# Patient Record
Sex: Male | Born: 1960
Health system: Southern US, Community
[De-identification: ages and names within clinical notes are randomized; demographics above are authoritative.]

## PROBLEM LIST (undated history)

## (undated) DIAGNOSIS — I10 Essential (primary) hypertension: Secondary | ICD-10-CM

---

## 2019-06-15 ENCOUNTER — Emergency Department (HOSPITAL_COMMUNITY)
Admission: EM | Admit: 2019-06-15 | Discharge: 2019-06-15 | Disposition: A | Discharge status: Home / Self Care | Payer: Self-pay | Attending: Emergency Medicine | Admitting: Emergency Medicine

## 2019-06-15 ENCOUNTER — Other Ambulatory Visit: Payer: Self-pay

## 2019-06-15 ENCOUNTER — Encounter (HOSPITAL_COMMUNITY): Payer: Self-pay

## 2019-06-15 DIAGNOSIS — R300 Dysuria: Secondary | ICD-10-CM | POA: Insufficient documentation

## 2019-06-15 DIAGNOSIS — F17200 Nicotine dependence, unspecified, uncomplicated: Secondary | ICD-10-CM | POA: Insufficient documentation

## 2019-06-15 DIAGNOSIS — R310 Gross hematuria: Secondary | ICD-10-CM | POA: Insufficient documentation

## 2019-06-15 DIAGNOSIS — I10 Essential (primary) hypertension: Secondary | ICD-10-CM | POA: Insufficient documentation

## 2019-06-15 HISTORY — DX: Essential (primary) hypertension: I10

## 2019-06-15 LAB — CBC WITH DIFFERENTIAL/PLATELET
Abs Immature Granulocytes: 0.02 10*3/uL (ref 0.00–0.07)
Basophils Absolute: 0 10*3/uL (ref 0.0–0.1)
Basophils Relative: 1 %
Eosinophils Absolute: 0.2 10*3/uL (ref 0.0–0.5)
Eosinophils Relative: 2 %
HCT: 45.5 % (ref 39.0–52.0)
Hemoglobin: 14.7 g/dL (ref 13.0–17.0)
Immature Granulocytes: 0 %
Lymphocytes Relative: 20 %
Lymphs Abs: 1.3 10*3/uL (ref 0.7–4.0)
MCH: 29.1 pg (ref 26.0–34.0)
MCHC: 32.3 g/dL (ref 30.0–36.0)
MCV: 90.1 fL (ref 80.0–100.0)
Monocytes Absolute: 0.8 10*3/uL (ref 0.1–1.0)
Monocytes Relative: 13 %
Neutro Abs: 4.1 10*3/uL (ref 1.7–7.7)
Neutrophils Relative %: 64 %
Platelets: 256 10*3/uL (ref 150–400)
RBC: 5.05 MIL/uL (ref 4.22–5.81)
RDW: 13 % (ref 11.5–15.5)
WBC: 6.4 10*3/uL (ref 4.0–10.5)
nRBC: 0 % (ref 0.0–0.2)

## 2019-06-15 LAB — COMPREHENSIVE METABOLIC PANEL
ALT: 31 U/L (ref 0–44)
AST: 37 U/L (ref 15–41)
Albumin: 3.8 g/dL (ref 3.5–5.0)
Alkaline Phosphatase: 56 U/L (ref 38–126)
Anion gap: 12 (ref 5–15)
BUN: 20 mg/dL (ref 6–20)
CO2: 21 mmol/L — ABNORMAL LOW (ref 22–32)
Calcium: 9 mg/dL (ref 8.9–10.3)
Chloride: 107 mmol/L (ref 98–111)
Creatinine, Ser: 1.19 mg/dL (ref 0.61–1.24)
GFR calc Af Amer: >60 mL/min (ref >60)
GFR calc non Af Amer: >60 mL/min (ref >60)
Glucose, Bld: 144 mg/dL — ABNORMAL HIGH (ref 70–99)
Potassium: 4.5 mmol/L (ref 3.5–5.1)
Sodium: 140 mmol/L (ref 135–145)
Total Bilirubin: 0.4 mg/dL (ref 0.3–1.2)
Total Protein: 7.4 g/dL (ref 6.5–8.1)

## 2019-06-15 LAB — URINALYSIS, ROUTINE W REFLEX MICROSCOPIC
Bacteria, UA: NONE SEEN
Bilirubin Urine: NEGATIVE
Glucose, UA: NEGATIVE mg/dL
Hgb urine dipstick: NEGATIVE
Ketones, ur: NEGATIVE mg/dL
Leukocytes,Ua: NEGATIVE
Nitrite: NEGATIVE
Protein, ur: 100 mg/dL — AB
Specific Gravity, Urine: 1.02 (ref 1.005–1.030)
pH: 5 (ref 5.0–8.0)

## 2019-06-15 NOTE — ED Notes (Signed)
Discharge paperwork reviewed with pt.  Pt verbalized understanding, no questions or concerns.  Pt ambulatory at discharge.

## 2019-06-15 NOTE — Discharge Instructions (Signed)
Lab results were overall reassuring.  There was some protein in the urine, but no noted blood.  Follow-up with urology for any further assessment or management.

## 2019-06-15 NOTE — ED Triage Notes (Signed)
He states that he saw a small quantity of hematuria with his foirst void of the day. He denies any pain, discomfort, fever, nor any other sign of current illness.

## 2019-06-15 NOTE — ED Notes (Signed)
Pt provided with [place] sandwich and orange juice, per his request.

## 2019-06-15 NOTE — ED Provider Notes (Signed)
[facility] DEPT Provider Note   CSN: [id number] Arrival date & time: 06/15/19  0809    History   Chief Complaint Chief Complaint  Patient presents with  . Hematuria    HPI Chad Coleman is a 58 y.o. male.     HPI   Chad Coleman is a 58 y.o. male, with a history of HTN, presenting to the ED with hematuria beginning this morning.  He notes he had 3 separate instances of urine with streaks of blood.  Notes some burning dysuria when blood was present during urination, however, no discomfort was noted after the the bleeding resolved.Marland Kitchen  He is also had a small amount of bleeding into his underwear.  He has not experienced these symptoms before. He states he has not been sexually active for several months.  Denies fever/chills, N/V/D, pain with bowel movements, testicular/scrotal pain/swelling, difficulty urinating, decreased urination, abdominal pain, other bleeding, or any other complaints.    Past Medical History:  Diagnosis Date  . Hypertension     There are no active problems to display for this patient.   History reviewed. No pertinent surgical history.      Home Medications    Prior to Admission medications   Medication Sig Start Date End Date Taking? Authorizing Provider  acetaminophen (TYLENOL) 325 MG tablet Take 325 mg by mouth every 6 (six) hours as needed for headache.   Yes [provider]  hydrochlorothiazide (HYDRODIURIL) 25 MG tablet Take 25 mg by mouth daily.   Yes [provider]  Multiple Vitamins-Minerals (MULTIVITAL PO) Take 1 tablet by mouth daily.   Yes [provider]    Family History History reviewed. No pertinent family history.  Social History Social History   Tobacco Use  . Smoking status: Current Every Day Smoker  . Smokeless tobacco: Never Used  Substance Use Topics  . Alcohol use: Not on file  . Drug use: Not on file     Allergies   Patient has no known  allergies.   Review of Systems Review of Systems  Constitutional: Negative for chills, diaphoresis and fever.  Respiratory: Negative for shortness of breath.   Cardiovascular: Negative for chest pain.  Gastrointestinal: Negative for abdominal pain, blood in stool, diarrhea, nausea and vomiting.  Genitourinary: Positive for dysuria and hematuria. Negative for decreased urine volume, difficulty urinating, discharge, flank pain, scrotal swelling and testicular pain.  Musculoskeletal: Negative for back pain.  All other systems reviewed and are negative.    Physical Exam Updated Vital Signs BP (!) 156/108   Pulse 68   Temp 98.7 F (37.1 C) (Oral)   Resp 16   SpO2 98%   Physical Exam Vitals signs and nursing note reviewed.  Constitutional:      General: He is not in acute distress.    Appearance: He is well-developed. He is not diaphoretic.  HENT:     Head: Normocephalic and atraumatic.     Mouth/Throat:     Mouth: Mucous membranes are moist.     Pharynx: Oropharynx is clear.  Eyes:     Conjunctiva/sclera: Conjunctivae normal.  Neck:     Musculoskeletal: Neck supple.  Cardiovascular:     Rate and Rhythm: Normal rate and regular rhythm.     Pulses: Normal pulses.          Radial pulses are 2+ on the right side and 2+ on the left side.       Posterior tibial pulses are 2+ on the right  side and 2+ on the left side.     Heart sounds: Normal heart sounds.     Comments: Tactile temperature in the extremities appropriate and equal bilaterally. Pulmonary:     Effort: Pulmonary effort is normal. No respiratory distress.     Breath sounds: Normal breath sounds.  Abdominal:     Palpations: Abdomen is soft.     Tenderness: There is no abdominal tenderness. There is no guarding.  Genitourinary:    Comments: Patient has a spot of dried blood in the front of his underwear about the size of a half dollar. Genital Exam: Penis, scrotum, and testicles without swelling, lesions, or  tenderness. No penile discharge.  No noted active hemorrhage. No inguinal hernia noted. Cremasteric reflex intact. No inguinal lymphadenopathy.  Otherwise normal male genitalia.  Musculoskeletal:     Right lower leg: No edema.     Left lower leg: No edema.  Lymphadenopathy:     Cervical: No cervical adenopathy.  Skin:    General: Skin is warm and dry.  Neurological:     Mental Status: He is alert.  Psychiatric:        Mood and Affect: Mood and affect normal.        Speech: Speech normal.        Behavior: Behavior normal.      ED Treatments / Results  Labs (all labs ordered are listed, but only abnormal results are displayed) Labs Reviewed  URINALYSIS, ROUTINE W REFLEX MICROSCOPIC - Abnormal; Notable for the following components:      Result Value   Protein, ur 100 (*)    All other components within normal limits  COMPREHENSIVE METABOLIC PANEL - Abnormal; Notable for the following components:   CO2 21 (*)    Glucose, Bld 144 (*)    All other components within normal limits  URINE CULTURE  CBC WITH DIFFERENTIAL/PLATELET  RPR  HIV ANTIBODY (ROUTINE TESTING W REFLEX)  GC/CHLAMYDIA PROBE AMP (Startup) NOT AT [facility]    EKG None  Radiology No results found.  Procedures Procedures (including critical care time)  Medications Ordered in ED Medications - No data to display   Initial Impression / Assessment and Plan / ED Course  I have reviewed the triage vital signs and the nursing notes.  Pertinent labs & imaging results that were available during my care of the patient were reviewed by me and considered in my medical decision making (see chart for details).        Patient presents with complaint of gross hematuria.  It seems to have resolved. No hemoglobin on UA.  There was some proteinuria, which patient will have rechecked in the next week or two.  Creatinine, BUN, hemoglobin, and platelets were all reassuring.  Urology follow-up for continued symptoms.  Return precautions discussed.  Patient voices understanding of these instructions, accepts the plan, and is comfortable with discharge.  Patient's hypertension here is noted.  He had not yet taken his medicines today.  He does not seem to be symptomatic at this time.  Final Clinical Impressions(s) / ED Diagnoses   Final diagnoses:  Gross hematuria    ED Discharge Orders    None       Layla Maw 06/15/19 1420    Milton Ferguson, MD 06/18/19 1820

## 2019-06-16 LAB — URINE CULTURE: Culture: NO GROWTH

## 2019-06-16 LAB — HIV ANTIBODY (ROUTINE TESTING W REFLEX): HIV Screen 4th Generation wRfx: NONREACTIVE

## 2019-06-17 LAB — RPR, QUANT+TP ABS (REFLEX)
Rapid Plasma Reagin, Quant: 1:2 {titer} — ABNORMAL HIGH
T Pallidum Abs: REACTIVE — AB

## 2019-06-17 LAB — RPR: RPR Ser Ql: REACTIVE — AB

## 2019-06-18 LAB — GC/CHLAMYDIA PROBE AMP (~~LOC~~) NOT AT ARMC
Chlamydia: NEGATIVE
Neisseria Gonorrhea: NEGATIVE

## 2020-09-30 ENCOUNTER — Emergency Department (HOSPITAL_COMMUNITY): Payer: Self-pay

## 2020-09-30 ENCOUNTER — Emergency Department (HOSPITAL_COMMUNITY)
Admission: EM | Admit: 2020-09-30 | Discharge: 2020-09-30 | Disposition: A | Discharge status: Home / Self Care | Payer: Self-pay | Attending: Emergency Medicine | Admitting: Emergency Medicine

## 2020-09-30 ENCOUNTER — Encounter (HOSPITAL_COMMUNITY): Payer: Self-pay | Admitting: Emergency Medicine

## 2020-09-30 ENCOUNTER — Other Ambulatory Visit: Payer: Self-pay

## 2020-09-30 DIAGNOSIS — I10 Essential (primary) hypertension: Secondary | ICD-10-CM | POA: Insufficient documentation

## 2020-09-30 DIAGNOSIS — R030 Elevated blood-pressure reading, without diagnosis of hypertension: Secondary | ICD-10-CM

## 2020-09-30 DIAGNOSIS — F17200 Nicotine dependence, unspecified, uncomplicated: Secondary | ICD-10-CM | POA: Insufficient documentation

## 2020-09-30 DIAGNOSIS — R310 Gross hematuria: Secondary | ICD-10-CM | POA: Insufficient documentation

## 2020-09-30 LAB — URINALYSIS, ROUTINE W REFLEX MICROSCOPIC
Bilirubin Urine: NEGATIVE
Glucose, UA: NEGATIVE mg/dL
Ketones, ur: NEGATIVE mg/dL
Leukocytes,Ua: NEGATIVE
Nitrite: NEGATIVE
Protein, ur: 100 mg/dL — AB
RBC / HPF: >50 RBC/hpf — ABNORMAL HIGH (ref 0–5)
Specific Gravity, Urine: 1.017 (ref 1.005–1.030)
pH: 5 (ref 5.0–8.0)

## 2020-09-30 LAB — CBC
HCT: 46.9 % (ref 39.0–52.0)
Hemoglobin: 15.6 g/dL (ref 13.0–17.0)
MCH: 29.5 pg (ref 26.0–34.0)
MCHC: 33.3 g/dL (ref 30.0–36.0)
MCV: 88.7 fL (ref 80.0–100.0)
Platelets: 259 10*3/uL (ref 150–400)
RBC: 5.29 MIL/uL (ref 4.22–5.81)
RDW: 12.6 % (ref 11.5–15.5)
WBC: 5.4 10*3/uL (ref 4.0–10.5)
nRBC: 0 % (ref 0.0–0.2)

## 2020-09-30 LAB — BASIC METABOLIC PANEL
Anion gap: 11 (ref 5–15)
BUN: 17 mg/dL (ref 6–20)
CO2: 22 mmol/L (ref 22–32)
Calcium: 9 mg/dL (ref 8.9–10.3)
Chloride: 105 mmol/L (ref 98–111)
Creatinine, Ser: 1.26 mg/dL — ABNORMAL HIGH (ref 0.61–1.24)
GFR, Estimated: >60 mL/min (ref >60)
Glucose, Bld: 108 mg/dL — ABNORMAL HIGH (ref 70–99)
Potassium: 3.9 mmol/L (ref 3.5–5.1)
Sodium: 138 mmol/L (ref 135–145)

## 2020-09-30 MED ORDER — HYDROCHLOROTHIAZIDE 12.5 MG PO CAPS
12.5000 mg | ORAL_CAPSULE | Freq: Once | ORAL | Status: AC
  Administered 2020-09-30: 12.5 mg via ORAL
  Filled 2020-09-30: qty 1

## 2020-09-30 MED ORDER — HYDROCHLOROTHIAZIDE 25 MG PO TABS: 25.0000 mg | ORAL_TABLET | Freq: Every day | ORAL | 1 refills | Status: DC

## 2020-09-30 NOTE — ED Notes (Signed)
Requested urine from patient. 

## 2020-09-30 NOTE — ED Triage Notes (Signed)
Patient states that he seen blood in his urine twice at work. Patient is not feeling any pain. Patient states he has this before.

## 2020-09-30 NOTE — ED Provider Notes (Addendum)
[facility] DEPT Provider Note   CSN: [id number] Arrival date & time: 09/30/20  0350     History Chief Complaint  Patient presents with  . Hematuria    Chad Coleman is a 59 y.o. male with past medical history significant for tobacco use who presents for evaluation of hematuria.  Patient states he has had 3 episodes which began over the last 6 hours of hematuria.  States this only occurs when he urinates.  He has not noted any bleeding from his urethra.  He is not currently sexually active.  Denies any penile discharge or concerns for STDs.  He denies any current abdominal pain.  He was seen 1 year ago for similar complaints.  He did not follow-up with urology.  States at that time his symptoms had self resolved.  Denies fever, chills, nausea, vomiting, dysuria, flank pain, abdominal pain, rashes, lesions.  Denies additional aggravating or alleviating factors.  Elevated BP here. Has been out of his HCTZ for 2 months. States 'ran out of refills' and didn't go back to PCP to have this filled.  History obtained from patient and past medical records. No interpretor was used.  HPI     Past Medical History:  Diagnosis Date  . Hypertension     There are no problems to display for this patient.   History reviewed. No pertinent surgical history.     History reviewed. No pertinent family history.  Social History   Tobacco Use  . Smoking status: Current Every Day Smoker  . Smokeless tobacco: Never Used  Substance Use Topics  . Alcohol use: Not on file  . Drug use: Not on file    Home Medications Prior to Admission medications   Medication Sig Start Date End Date Taking? Authorizing Provider  acetaminophen (TYLENOL) 325 MG tablet Take 325 mg by mouth every 6 (six) hours as needed for headache.   Yes [provider]  hydrochlorothiazide (HYDRODIURIL) 25 MG tablet Take 1 tablet (25 mg total) by mouth daily. 09/30/20   Etai Copado A,  PA-C    Allergies    Patient has no known allergies.  Review of Systems   Review of Systems  Constitutional: Negative.   HENT: Negative.   Respiratory: Negative.   Cardiovascular: Negative.   Gastrointestinal: Negative.   Genitourinary: Positive for hematuria. Negative for decreased urine volume, difficulty urinating, discharge, dysuria, flank pain, frequency, penile pain, penile swelling, scrotal swelling, testicular pain and urgency.  Musculoskeletal: Negative.   Skin: Negative.   Neurological: Negative.   All other systems reviewed and are negative.   Physical Exam Updated Vital Signs BP (!) 164/114 (BP Location: Right Arm)   Pulse (!) 58   Temp 98.9 F (37.2 C) (Oral)   Resp 18   Ht 5\' 9"  (1.753 m)   Wt 78.5 kg   SpO2 97%   BMI 25.55 kg/m   Physical Exam Vitals and nursing note reviewed.  Constitutional:      General: He is not in acute distress.    Appearance: He is well-developed. He is not ill-appearing, toxic-appearing or diaphoretic.  HENT:     Head: Normocephalic and atraumatic.     Nose: Nose normal.     Mouth/Throat:     Mouth: Mucous membranes are moist.  Eyes:     Pupils: Pupils are equal, round, and reactive to light.  Cardiovascular:     Rate and Rhythm: Normal rate and regular rhythm.     Pulses: Normal pulses.  Radial pulses are 2+ on the right side and 2+ on the left side.       Dorsalis pedis pulses are 2+ on the right side and 2+ on the left side.       Posterior tibial pulses are 2+ on the right side and 2+ on the left side.     Heart sounds: Normal heart sounds.  Pulmonary:     Effort: Pulmonary effort is normal. No respiratory distress.     Breath sounds: Normal breath sounds and air entry.  Abdominal:     General: Bowel sounds are normal. There is no distension.     Palpations: Abdomen is soft. There is no mass.     Tenderness: There is no abdominal tenderness. There is no right CVA tenderness, left CVA tenderness, guarding or  rebound.     Hernia: No hernia is present.     Comments: No midline pulsatile abd mass. Neg CVA tap bilaterally  Genitourinary:    Comments: Declined GU exam Musculoskeletal:        General: No swelling, tenderness, deformity or signs of injury. Normal range of motion.     Cervical back: Normal range of motion and neck supple.     Right lower leg: No edema.     Left lower leg: No edema.  Skin:    General: Skin is warm and dry.     Capillary Refill: Capillary refill takes less than 2 seconds.     Comments: Tactile temp to extremities  Neurological:     General: No focal deficit present.     Mental Status: He is alert and oriented to person, place, and time.    ED Results / Procedures / Treatments   Labs (all labs ordered are listed, but only abnormal results are displayed) Labs Reviewed  URINALYSIS, ROUTINE W REFLEX MICROSCOPIC - Abnormal; Notable for the following components:      Result Value   Hgb urine dipstick LARGE (*)    Protein, ur 100 (*)    RBC / HPF >50 (*)    Bacteria, UA RARE (*)    All other components within normal limits  BASIC METABOLIC PANEL - Abnormal; Notable for the following components:   Glucose, Bld 108 (*)    Creatinine, Ser 1.26 (*)    All other components within normal limits  CBC    EKG None  Radiology CT Renal Stone Study  Result Date: 09/30/2020 CLINICAL DATA:  Flank pain with gross hematuria EXAM: CT ABDOMEN AND PELVIS WITHOUT CONTRAST TECHNIQUE: Multidetector CT imaging of the abdomen and pelvis was performed following the standard protocol without IV contrast. COMPARISON:  None. FINDINGS: Lower chest:  No contributory findings. Hepatobiliary: No focal liver abnormality.No evidence of biliary obstruction or stone. Pancreas: Unremarkable. Spleen: Unremarkable. Adrenals/Urinary Tract: Negative adrenals. No hydronephrosis or stone. Unremarkable bladder. Stomach/Bowel:  No obstruction. No appendicitis. Vascular/Lymphatic: No acute vascular  abnormality. No mass or adenopathy. Reproductive:Symmetric enlargement of the prostate, 5 cm in transverse span. Other: No ascites or pneumoperitoneum. Fatty bilateral inguinal hernia Musculoskeletal: No acute abnormalities. L1-2 and L3-4 predominant disc degeneration and facet osteoarthritis. IMPRESSION: 1. No hydronephrosis or urolithiasis. 2. Enlarged prostate. Electronically Signed   By: Monte Fantasia M.D.   On: 09/30/2020 05:27    Procedures Procedures (including critical care time)  Medications Ordered in ED Medications  hydrochlorothiazide (MICROZIDE) capsule 12.5 mg (has no administration in time range)    ED Course  I have reviewed the triage vital signs and the nursing  notes.  Pertinent labs & imaging results that were available during my care of the patient were reviewed by me and considered in my medical decision making (see chart for details).  59 year old male presents for evaluation of hematuria.  Had similar episode 1 year ago which self resolved.  Patient denies any dysuria, frequency, penile discharge.  Has not been recently sexually active and does not have any concerns for STDs.  No associated abdominal pain.  He is not on any anticoagulation.  Heart and lungs clear.  Abdomen soft, nontender.  He is neurovascularly intact.,  Tactile temperature to extremities.  Patient is hypertensive here in the emergency department.  He is not follow with PCP does not know if he has history of hypertension.  He denies any headache, lightness, dizziness, chest pain, shortness of breath, abdominal pain, nausea or vomiting.  I have low suspicion for hypertensive urgency or emergency at this time.  Plan on labs, imaging and reassess:  Labs and imaging personally reviewed and interpreted:  CBC without leukocytosis Metabolic panel with glucose 108, creatinine 1.26, similar to prior labs, noticed electrolyte, renal abnormality UA hematuria, no evidence of infection CT Stone without stone, does  have enlarged prostate  Patient reassessed.  Continues to be without pain.  Was given his home blood pressure medication.  Continue to have low suspicion for hypertensive urgency or emergency.  Unclear etiology of patient's hematuria.  I did discuss with him risks of elevated blood pressure.  We will have him follow-up with urology for his enlarged prostate as well as his recurrent hematuria.  No abdominal pain to suggest AAA, dissection, infectious process.  The patient has been appropriately medically screened and/or stabilized in the ED. I have low suspicion for any other emergent medical condition which would require further screening, evaluation or treatment in the ED or require inpatient management.  Patient is hemodynamically stable and in no acute distress.  Patient able to ambulate in department prior to ED.  Evaluation does not show acute pathology that would require ongoing or additional emergent interventions while in the emergency department or further inpatient treatment.  I have discussed the diagnosis with the patient and answered all questions.  Pain is been managed while in the emergency department and patient has no further complaints prior to discharge.  Patient is comfortable with plan discussed in room and is stable for discharge at this time.  I have discussed strict return precautions for returning to the emergency department.  Patient was encouraged to follow-up with PCP/specialist refer to at discharge.    MDM Rules/Calculators/A&P                           Final Clinical Impression(s) / ED Diagnoses Final diagnoses:  Gross hematuria  Elevated blood pressure reading    Rx / DC Orders ED Discharge Orders         Ordered    hydrochlorothiazide (HYDRODIURIL) 25 MG tablet  Daily        09/30/20 0541           Shadow Stiggers A, PA-C 09/30/20 0548    Silas Sedam A, PA-C 09/30/20 [place], [place], DO 09/30/20 [phone]

## 2020-09-30 NOTE — Discharge Instructions (Signed)
Take the blood pressure medication as prescribed.  Follow up with the Urologist  Return for new or worsening symptoms

## 2021-02-10 ENCOUNTER — Encounter (HOSPITAL_COMMUNITY): Payer: Self-pay

## 2021-02-10 ENCOUNTER — Emergency Department (HOSPITAL_COMMUNITY): Payer: Medicaid Other

## 2021-02-10 ENCOUNTER — Emergency Department (HOSPITAL_COMMUNITY)
Admission: EM | Admit: 2021-02-10 | Discharge: 2021-02-10 | Disposition: A | Discharge status: Home / Self Care | Payer: Medicaid Other | Attending: Emergency Medicine | Admitting: Emergency Medicine

## 2021-02-10 ENCOUNTER — Other Ambulatory Visit: Payer: Self-pay | Admitting: Emergency Medicine

## 2021-02-10 DIAGNOSIS — F1410 Cocaine abuse, uncomplicated: Secondary | ICD-10-CM | POA: Diagnosis not present

## 2021-02-10 DIAGNOSIS — I10 Essential (primary) hypertension: Secondary | ICD-10-CM | POA: Insufficient documentation

## 2021-02-10 DIAGNOSIS — D179 Benign lipomatous neoplasm, unspecified: Secondary | ICD-10-CM | POA: Insufficient documentation

## 2021-02-10 DIAGNOSIS — Z720 Tobacco use: Secondary | ICD-10-CM

## 2021-02-10 DIAGNOSIS — Z79899 Other long term (current) drug therapy: Secondary | ICD-10-CM | POA: Insufficient documentation

## 2021-02-10 DIAGNOSIS — F17200 Nicotine dependence, unspecified, uncomplicated: Secondary | ICD-10-CM | POA: Insufficient documentation

## 2021-02-10 DIAGNOSIS — R079 Chest pain, unspecified: Secondary | ICD-10-CM | POA: Diagnosis present

## 2021-02-10 LAB — LIPID PANEL
Cholesterol: 111 mg/dL (ref 0–200)
HDL: 46 mg/dL (ref >40)
LDL Cholesterol: 52 mg/dL (ref 0–99)
Total CHOL/HDL Ratio: 2.4 RATIO
Triglycerides: 64 mg/dL (ref <150)
VLDL: 13 mg/dL (ref 0–40)

## 2021-02-10 LAB — CBC
HCT: 44.6 % (ref 39.0–52.0)
Hemoglobin: 15.1 g/dL (ref 13.0–17.0)
MCH: 29.5 pg (ref 26.0–34.0)
MCHC: 33.9 g/dL (ref 30.0–36.0)
MCV: 87.1 fL (ref 80.0–100.0)
Platelets: 319 10*3/uL (ref 150–400)
RBC: 5.12 MIL/uL (ref 4.22–5.81)
RDW: 12.9 % (ref 11.5–15.5)
WBC: 10.1 10*3/uL (ref 4.0–10.5)
nRBC: 0 % (ref 0.0–0.2)

## 2021-02-10 LAB — RAPID URINE DRUG SCREEN, HOSP PERFORMED
Amphetamines: NOT DETECTED
Barbiturates: NOT DETECTED
Benzodiazepines: NOT DETECTED
Cocaine: POSITIVE — AB
Opiates: POSITIVE — AB
Tetrahydrocannabinol: NOT DETECTED

## 2021-02-10 LAB — URINALYSIS, ROUTINE W REFLEX MICROSCOPIC
Bacteria, UA: NONE SEEN
Bilirubin Urine: NEGATIVE
Glucose, UA: NEGATIVE mg/dL
Hgb urine dipstick: NEGATIVE
Ketones, ur: NEGATIVE mg/dL
Leukocytes,Ua: NEGATIVE
Nitrite: NEGATIVE
Protein, ur: >=300 mg/dL — AB
Specific Gravity, Urine: 1.017 (ref 1.005–1.030)
pH: 5 (ref 5.0–8.0)

## 2021-02-10 LAB — TROPONIN I (HIGH SENSITIVITY)
Troponin I (High Sensitivity): 71 ng/L — ABNORMAL HIGH (ref <18)
Troponin I (High Sensitivity): 60 ng/L — ABNORMAL HIGH (ref <18)

## 2021-02-10 LAB — COMPREHENSIVE METABOLIC PANEL
ALT: 28 U/L (ref 0–44)
AST: 29 U/L (ref 15–41)
Albumin: 3.5 g/dL (ref 3.5–5.0)
Alkaline Phosphatase: 47 U/L (ref 38–126)
Anion gap: 7 (ref 5–15)
BUN: 9 mg/dL (ref 6–20)
CO2: 27 mmol/L (ref 22–32)
Calcium: 8.8 mg/dL — ABNORMAL LOW (ref 8.9–10.3)
Chloride: 104 mmol/L (ref 98–111)
Creatinine, Ser: 1.2 mg/dL (ref 0.61–1.24)
GFR, Estimated: >60 mL/min (ref >60)
Glucose, Bld: 108 mg/dL — ABNORMAL HIGH (ref 70–99)
Potassium: 3.7 mmol/L (ref 3.5–5.1)
Sodium: 138 mmol/L (ref 135–145)
Total Bilirubin: 1 mg/dL (ref 0.3–1.2)
Total Protein: 7 g/dL (ref 6.5–8.1)

## 2021-02-10 MED ORDER — AMLODIPINE BESYLATE 5 MG PO TABS: 5.0000 mg | ORAL_TABLET | Freq: Every day | ORAL | 1 refills | Status: DC

## 2021-02-10 MED ORDER — AMLODIPINE BESYLATE 5 MG PO TABS: 5.0000 mg | ORAL_TABLET | Freq: Every day | ORAL | 0 refills | Status: DC

## 2021-02-10 MED ORDER — ONDANSETRON HCL 4 MG/2ML IJ SOLN
4.0000 mg | Freq: Once | INTRAMUSCULAR | Status: AC
  Administered 2021-02-10: 4 mg via INTRAVENOUS
  Filled 2021-02-10: qty 2

## 2021-02-10 MED ORDER — MORPHINE SULFATE (PF) 4 MG/ML IV SOLN
4.0000 mg | Freq: Once | INTRAVENOUS | Status: AC
  Administered 2021-02-10: 4 mg via INTRAVENOUS
  Filled 2021-02-10: qty 1

## 2021-02-10 MED ORDER — AMLODIPINE BESYLATE 5 MG PO TABS
5.0000 mg | ORAL_TABLET | Freq: Once | ORAL | Status: AC
  Administered 2021-02-10: 5 mg via ORAL
  Filled 2021-02-10: qty 1

## 2021-02-10 MED ORDER — LABETALOL HCL 5 MG/ML IV SOLN
10.0000 mg | Freq: Once | INTRAVENOUS | Status: AC
  Administered 2021-02-10: 10 mg via INTRAVENOUS
  Filled 2021-02-10: qty 4

## 2021-02-10 NOTE — Discharge Instructions (Addendum)
Stop smoking.  Stop using cocaine.

## 2021-02-10 NOTE — ED Triage Notes (Signed)
Pt arrived to ED via ems from home. Pt called ems for sudden on set of chest pain and headache. Report has not taken his blood pressure medication in 2 weeks and since then has not felt well. EMS found pt to be hypertensive 220/110. 324 of asa and 1 nitro given without relief. Pt is mostly c/o of headache frontal and posterior. 10/10

## 2021-02-10 NOTE — Progress Notes (Signed)
   02/10/21 0957  TOC ED Mini Assessment  TOC Time spent with patient (minutes): 20  PING Used in TOC Assessment No  Admission or Readmission Diverted Yes  Interventions which prevented an admission or readmission Medication Review;Follow-up medical appointment  What brought you to the Emergency Department?  chest pain  Barriers to Discharge ED Uninsured needing medication assistance  Barrier interventions [facility]  Means of departure Car    Shylee Durrett J. Clydene Laming, [place], [place], [place]  RNCM set up appointment with [facility] on 4/21 @3 :00.  Spoke with pt at bedside and advised to please arrive 15 min early and take a picture ID and your current medications.  Pt verbalizes understanding of keeping appointment.   RNCM consulted regarding uninsured pt requiring Rx.  RNCM utilized TOC petty cash to assist with co-pay $4.00. [facility] will deliver Rx to patient at bedside prior to discharge from hospital.
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
## 2021-02-21 ENCOUNTER — Other Ambulatory Visit: Payer: Self-pay

## 2021-02-21 ENCOUNTER — Inpatient Hospital Stay (HOSPITAL_COMMUNITY)
Admission: EM | Admit: 2021-02-21 | Discharge: 2021-04-13 | DRG: 064 | Disposition: A | Discharge status: Skilled Nursing Facility | Payer: Medicaid Other | Attending: Internal Medicine | Admitting: Internal Medicine

## 2021-02-21 ENCOUNTER — Emergency Department (HOSPITAL_COMMUNITY): Payer: Medicaid Other

## 2021-02-21 DIAGNOSIS — E1165 Type 2 diabetes mellitus with hyperglycemia: Secondary | ICD-10-CM | POA: Diagnosis not present

## 2021-02-21 DIAGNOSIS — R4587 Impulsiveness: Secondary | ICD-10-CM | POA: Diagnosis not present

## 2021-02-21 DIAGNOSIS — W06XXXA Fall from bed, initial encounter: Secondary | ICD-10-CM | POA: Diagnosis not present

## 2021-02-21 DIAGNOSIS — F17210 Nicotine dependence, cigarettes, uncomplicated: Secondary | ICD-10-CM | POA: Diagnosis present

## 2021-02-21 DIAGNOSIS — D72829 Elevated white blood cell count, unspecified: Secondary | ICD-10-CM

## 2021-02-21 DIAGNOSIS — E876 Hypokalemia: Secondary | ICD-10-CM | POA: Diagnosis present

## 2021-02-21 DIAGNOSIS — I161 Hypertensive emergency: Secondary | ICD-10-CM | POA: Diagnosis present

## 2021-02-21 DIAGNOSIS — I472 Ventricular tachycardia: Secondary | ICD-10-CM | POA: Diagnosis not present

## 2021-02-21 DIAGNOSIS — R7401 Elevation of levels of liver transaminase levels: Secondary | ICD-10-CM | POA: Diagnosis not present

## 2021-02-21 DIAGNOSIS — J069 Acute upper respiratory infection, unspecified: Secondary | ICD-10-CM

## 2021-02-21 DIAGNOSIS — S06369A Traumatic hemorrhage of cerebrum, unspecified, with loss of consciousness of unspecified duration, initial encounter: Secondary | ICD-10-CM | POA: Diagnosis not present

## 2021-02-21 DIAGNOSIS — E785 Hyperlipidemia, unspecified: Secondary | ICD-10-CM | POA: Diagnosis present

## 2021-02-21 DIAGNOSIS — I610 Nontraumatic intracerebral hemorrhage in hemisphere, subcortical: Principal | ICD-10-CM

## 2021-02-21 DIAGNOSIS — E860 Dehydration: Secondary | ICD-10-CM | POA: Diagnosis present

## 2021-02-21 DIAGNOSIS — R29713 NIHSS score 13: Secondary | ICD-10-CM | POA: Diagnosis present

## 2021-02-21 DIAGNOSIS — I10 Essential (primary) hypertension: Secondary | ICD-10-CM

## 2021-02-21 DIAGNOSIS — E871 Hypo-osmolality and hyponatremia: Secondary | ICD-10-CM | POA: Diagnosis not present

## 2021-02-21 DIAGNOSIS — K219 Gastro-esophageal reflux disease without esophagitis: Secondary | ICD-10-CM | POA: Diagnosis not present

## 2021-02-21 DIAGNOSIS — S069X1A Unspecified intracranial injury with loss of consciousness of 30 minutes or less, initial encounter: Secondary | ICD-10-CM

## 2021-02-21 DIAGNOSIS — M25511 Pain in right shoulder: Secondary | ICD-10-CM | POA: Diagnosis not present

## 2021-02-21 DIAGNOSIS — R32 Unspecified urinary incontinence: Secondary | ICD-10-CM | POA: Diagnosis not present

## 2021-02-21 DIAGNOSIS — H04129 Dry eye syndrome of unspecified lacrimal gland: Secondary | ICD-10-CM | POA: Diagnosis not present

## 2021-02-21 DIAGNOSIS — Z720 Tobacco use: Secondary | ICD-10-CM

## 2021-02-21 DIAGNOSIS — R296 Repeated falls: Secondary | ICD-10-CM | POA: Diagnosis not present

## 2021-02-21 DIAGNOSIS — R7303 Prediabetes: Secondary | ICD-10-CM | POA: Diagnosis present

## 2021-02-21 DIAGNOSIS — N390 Urinary tract infection, site not specified: Secondary | ICD-10-CM | POA: Diagnosis not present

## 2021-02-21 DIAGNOSIS — G8194 Hemiplegia, unspecified affecting left nondominant side: Secondary | ICD-10-CM | POA: Diagnosis present

## 2021-02-21 DIAGNOSIS — Z9114 Patient's other noncompliance with medication regimen: Secondary | ICD-10-CM

## 2021-02-21 DIAGNOSIS — R471 Dysarthria and anarthria: Secondary | ICD-10-CM | POA: Diagnosis present

## 2021-02-21 DIAGNOSIS — R414 Neurologic neglect syndrome: Secondary | ICD-10-CM | POA: Diagnosis present

## 2021-02-21 DIAGNOSIS — U071 COVID-19: Secondary | ICD-10-CM | POA: Diagnosis not present

## 2021-02-21 DIAGNOSIS — S069X9A Unspecified intracranial injury with loss of consciousness of unspecified duration, initial encounter: Secondary | ICD-10-CM

## 2021-02-21 DIAGNOSIS — D179 Benign lipomatous neoplasm, unspecified: Secondary | ICD-10-CM | POA: Diagnosis present

## 2021-02-21 DIAGNOSIS — Z781 Physical restraint status: Secondary | ICD-10-CM

## 2021-02-21 DIAGNOSIS — Z79899 Other long term (current) drug therapy: Secondary | ICD-10-CM | POA: Diagnosis not present

## 2021-02-21 DIAGNOSIS — F1410 Cocaine abuse, uncomplicated: Secondary | ICD-10-CM | POA: Diagnosis present

## 2021-02-21 DIAGNOSIS — I619 Nontraumatic intracerebral hemorrhage, unspecified: Secondary | ICD-10-CM

## 2021-02-21 DIAGNOSIS — R29810 Facial weakness: Secondary | ICD-10-CM | POA: Diagnosis present

## 2021-02-21 DIAGNOSIS — R1310 Dysphagia, unspecified: Secondary | ICD-10-CM | POA: Diagnosis present

## 2021-02-21 DIAGNOSIS — I69391 Dysphagia following cerebral infarction: Secondary | ICD-10-CM

## 2021-02-21 DIAGNOSIS — R748 Abnormal levels of other serum enzymes: Secondary | ICD-10-CM | POA: Diagnosis present

## 2021-02-21 DIAGNOSIS — G4700 Insomnia, unspecified: Secondary | ICD-10-CM | POA: Diagnosis not present

## 2021-02-21 DIAGNOSIS — Z20822 Contact with and (suspected) exposure to covid-19: Secondary | ICD-10-CM | POA: Diagnosis present

## 2021-02-21 DIAGNOSIS — R630 Anorexia: Secondary | ICD-10-CM | POA: Diagnosis not present

## 2021-02-21 DIAGNOSIS — G936 Cerebral edema: Secondary | ICD-10-CM | POA: Diagnosis present

## 2021-02-21 DIAGNOSIS — H5340 Unspecified visual field defects: Secondary | ICD-10-CM | POA: Diagnosis present

## 2021-02-21 DIAGNOSIS — B964 Proteus (mirabilis) (morganii) as the cause of diseases classified elsewhere: Secondary | ICD-10-CM | POA: Diagnosis not present

## 2021-02-21 DIAGNOSIS — M7918 Myalgia, other site: Secondary | ICD-10-CM | POA: Diagnosis not present

## 2021-02-21 DIAGNOSIS — R3589 Other polyuria: Secondary | ICD-10-CM | POA: Diagnosis present

## 2021-02-21 DIAGNOSIS — E861 Hypovolemia: Secondary | ICD-10-CM | POA: Diagnosis not present

## 2021-02-21 DIAGNOSIS — R4182 Altered mental status, unspecified: Secondary | ICD-10-CM | POA: Diagnosis not present

## 2021-02-21 DIAGNOSIS — R509 Fever, unspecified: Secondary | ICD-10-CM

## 2021-02-21 DIAGNOSIS — R109 Unspecified abdominal pain: Secondary | ICD-10-CM

## 2021-02-21 DIAGNOSIS — R410 Disorientation, unspecified: Secondary | ICD-10-CM | POA: Diagnosis not present

## 2021-02-21 LAB — CBC
HCT: 49.3 % (ref 39.0–52.0)
Hemoglobin: 15.7 g/dL (ref 13.0–17.0)
MCH: 29 pg (ref 26.0–34.0)
MCHC: 31.8 g/dL (ref 30.0–36.0)
MCV: 91 fL (ref 80.0–100.0)
Platelets: 315 10*3/uL (ref 150–400)
RBC: 5.42 MIL/uL (ref 4.22–5.81)
RDW: 12.4 % (ref 11.5–15.5)
WBC: 6.5 10*3/uL (ref 4.0–10.5)
nRBC: 0 % (ref 0.0–0.2)

## 2021-02-21 LAB — I-STAT CHEM 8, ED
BUN: 16 mg/dL (ref 6–20)
Calcium, Ion: 1.07 mmol/L — ABNORMAL LOW (ref 1.15–1.40)
Chloride: 105 mmol/L (ref 98–111)
Creatinine, Ser: 1.1 mg/dL (ref 0.61–1.24)
Glucose, Bld: 118 mg/dL — ABNORMAL HIGH (ref 70–99)
HCT: 48 % (ref 39.0–52.0)
Hemoglobin: 16.3 g/dL (ref 13.0–17.0)
Potassium: 4.4 mmol/L (ref 3.5–5.1)
Sodium: 141 mmol/L (ref 135–145)
TCO2: 29 mmol/L (ref 22–32)

## 2021-02-21 LAB — COMPREHENSIVE METABOLIC PANEL
ALT: 29 U/L (ref 0–44)
AST: 45 U/L — ABNORMAL HIGH (ref 15–41)
Albumin: 3.6 g/dL (ref 3.5–5.0)
Alkaline Phosphatase: 45 U/L (ref 38–126)
Anion gap: 8 (ref 5–15)
BUN: 12 mg/dL (ref 6–20)
CO2: 24 mmol/L (ref 22–32)
Calcium: 8.9 mg/dL (ref 8.9–10.3)
Chloride: 106 mmol/L (ref 98–111)
Creatinine, Ser: 1.17 mg/dL (ref 0.61–1.24)
GFR, Estimated: >60 mL/min (ref >60)
Glucose, Bld: 118 mg/dL — ABNORMAL HIGH (ref 70–99)
Potassium: 4.6 mmol/L (ref 3.5–5.1)
Sodium: 138 mmol/L (ref 135–145)
Total Bilirubin: 1.1 mg/dL (ref 0.3–1.2)
Total Protein: 6.4 g/dL — ABNORMAL LOW (ref 6.5–8.1)

## 2021-02-21 LAB — DIFFERENTIAL
Abs Immature Granulocytes: 0.03 10*3/uL (ref 0.00–0.07)
Basophils Absolute: 0 10*3/uL (ref 0.0–0.1)
Basophils Relative: 1 %
Eosinophils Absolute: 0.1 10*3/uL (ref 0.0–0.5)
Eosinophils Relative: 2 %
Immature Granulocytes: 1 %
Lymphocytes Relative: 16 %
Lymphs Abs: 1 10*3/uL (ref 0.7–4.0)
Monocytes Absolute: 0.9 10*3/uL (ref 0.1–1.0)
Monocytes Relative: 15 %
Neutro Abs: 4.3 10*3/uL (ref 1.7–7.7)
Neutrophils Relative %: 65 %

## 2021-02-21 LAB — RESP PANEL BY RT-PCR (FLU A&B, COVID) ARPGX2
Influenza A by PCR: NEGATIVE
Influenza B by PCR: NEGATIVE
SARS Coronavirus 2 by RT PCR: NEGATIVE

## 2021-02-21 LAB — HIV ANTIBODY (ROUTINE TESTING W REFLEX): HIV Screen 4th Generation wRfx: NONREACTIVE

## 2021-02-21 LAB — GLUCOSE, CAPILLARY
Glucose-Capillary: 121 mg/dL — ABNORMAL HIGH (ref 70–99)
Glucose-Capillary: 143 mg/dL — ABNORMAL HIGH (ref 70–99)

## 2021-02-21 LAB — ETHANOL: Alcohol, Ethyl (B): <10 mg/dL (ref <10)

## 2021-02-21 MED ORDER — CHLORHEXIDINE GLUCONATE CLOTH 2 % EX PADS
6.0000 | MEDICATED_PAD | Freq: Every day | CUTANEOUS | Status: DC
  Administered 2021-02-22 – 2021-02-28 (×7): 6 via TOPICAL

## 2021-02-21 MED ORDER — STROKE: EARLY STAGES OF RECOVERY BOOK
Freq: Once | Status: AC
  Filled 2021-02-21 (×2): qty 1

## 2021-02-21 MED ORDER — INFLUENZA VAC SPLIT QUAD 0.5 ML IM SUSY: 0.5000 mL | PREFILLED_SYRINGE | INTRAMUSCULAR | Status: DC

## 2021-02-21 MED ORDER — ACETAMINOPHEN 325 MG PO TABS
650.0000 mg | ORAL_TABLET | ORAL | Status: DC | PRN
  Administered 2021-02-22 – 2021-04-12 (×36): 650 mg via ORAL
  Filled 2021-02-21 (×40): qty 2

## 2021-02-21 MED ORDER — CLEVIDIPINE BUTYRATE 0.5 MG/ML IV EMUL
0.0000 mg/h | INTRAVENOUS | Status: DC
  Administered 2021-02-21: 4 mg/h via INTRAVENOUS
  Administered 2021-02-21: 12 mg/h via INTRAVENOUS
  Administered 2021-02-21: 6 mg/h via INTRAVENOUS
  Administered 2021-02-21: 1 mg/h via INTRAVENOUS
  Administered 2021-02-22: 12 mg/h via INTRAVENOUS
  Administered 2021-02-22: 14 mg/h via INTRAVENOUS
  Administered 2021-02-22: 12 mg/h via INTRAVENOUS
  Administered 2021-02-22: 8 mg/h via INTRAVENOUS
  Administered 2021-02-22 – 2021-02-23 (×2): 16 mg/h via INTRAVENOUS
  Administered 2021-02-23: 20 mg/h via INTRAVENOUS
  Administered 2021-02-23: 8 mg/h via INTRAVENOUS
  Administered 2021-02-23: 20 mg/h via INTRAVENOUS
  Administered 2021-02-24: 7 mg/h via INTRAVENOUS
  Administered 2021-02-24: 6 mg/h via INTRAVENOUS
  Administered 2021-02-24: 2 mg/h via INTRAVENOUS
  Administered 2021-02-25 (×2): 8 mg/h via INTRAVENOUS
  Filled 2021-02-21: qty 50
  Filled 2021-02-21 (×3): qty 100
  Filled 2021-02-21 (×4): qty 50
  Filled 2021-02-21: qty 100
  Filled 2021-02-21 (×2): qty 50
  Filled 2021-02-21: qty 100
  Filled 2021-02-21: qty 50
  Filled 2021-02-21 (×2): qty 100
  Filled 2021-02-21: qty 50
  Filled 2021-02-21: qty 100

## 2021-02-21 MED ORDER — ACETAMINOPHEN 650 MG RE SUPP: 650.0000 mg | RECTAL | Status: DC | PRN

## 2021-02-21 MED ORDER — ACETAMINOPHEN 160 MG/5ML PO SOLN: 650.0000 mg | ORAL | Status: DC | PRN

## 2021-02-21 MED ORDER — SENNOSIDES-DOCUSATE SODIUM 8.6-50 MG PO TABS
1.0000 | ORAL_TABLET | Freq: Two times a day (BID) | ORAL | Status: DC
  Administered 2021-02-22 – 2021-04-13 (×80): 1 via ORAL
  Filled 2021-02-21 (×91): qty 1

## 2021-02-21 MED ORDER — LABETALOL HCL 5 MG/ML IV SOLN
20.0000 mg | Freq: Once | INTRAVENOUS | Status: AC
  Administered 2021-02-21: 20 mg via INTRAVENOUS

## 2021-02-21 MED ORDER — PANTOPRAZOLE SODIUM 40 MG IV SOLR
40.0000 mg | Freq: Every day | INTRAVENOUS | Status: DC
  Administered 2021-02-21 – 2021-02-23 (×3): 40 mg via INTRAVENOUS
  Filled 2021-02-21 (×3): qty 40

## 2021-02-21 MED ORDER — IOHEXOL 350 MG/ML SOLN
75.0000 mL | Freq: Once | INTRAVENOUS | Status: AC | PRN
  Administered 2021-02-21: 75 mL via INTRAVENOUS

## 2021-02-21 NOTE — ED Notes (Signed)
Report given to RN receiving pt in room 4N26.

## 2021-02-21 NOTE — ED Triage Notes (Signed)
Pt from home BIB EMS for c/o left sided weakness, left facial droop, left partial gaze, and decreased sensation. Pt slid out of bed and realized his symptoms about 74min PTA. BGL 109 on arrival.

## 2021-02-21 NOTE — Code Documentation (Signed)
Stroke Response Nurse Documentation Code Documentation  Chad Coleman is a 60 y.o. male arriving to [facility] ED via [facility] on 02/21/2021 with past medical hx of HTN. Code stroke was activated by EMS. Patient from home where he was LKW at 1130 and now complaining of Left sided weakness and left facial droop . On No antithrombotic. Stroke team at the bedside on patient arrival. Labs drawn and patient cleared for CT by Dr. Almyra Free. Patient to CT with team. NIHSS 13, see documentation for details and code stroke times. Patient with right gaze preference , left hemianopia, left facial droop, left arm weakness, left leg weakness, left decreased sensation and dysarthria  on exam. The following imaging was completed:  CT, CTA head and neck. Patient is not a candidate for tPA due to CT findings   Care/Plan: Q1H NIHSS, vital signs, and pupil assessment. Bedside handoff with ED RN Jackquline Bosch, Demetrio Lapping  Stroke Response RN

## 2021-02-21 NOTE — Consult Note (Incomplete)
Neurology Consultation  Reason for Consult: Left-sided weakness and numbness with left facial droop Referring Physician: Dr. Almyra Free  CC: Left-sided weakness, numbness, and facial droop  History is obtained from: Chart review, EMS, and Patient  HPI: Chad Coleman is a 60 y.o. male with a medical history significant for hypertension, tobacco use, noncompliance with antihypertensive medications, and cocaine abuse who presents to the ED via EMS as a Code Stroke for evaluation of acute onset of left-sided weakness, numbness, and left facial droop. Last seen normal by his roommate at 11:30 this morning but the patient states that his weakness onset was approximately 45-minutes prior to hospital arrival (arrivaed at 17:27). Of note was a recent ER visit where he was found to be hypertensive and reported that he had been out of his hydrochlorothiazide and felt that it did not help with his hypertension but just caused polyuria.   LKW: 11:30 02/21/21 last seen normal by roommate, patient states onset of weakness was approximately 16:45 tpa given?: no, ***  ROS: A 14 point ROS was performed and is negative except as noted in the HPI. *** Unable to obtain due to altered mental status.   Past Medical History:  Diagnosis Date  . Hypertension   Cocaine abuse Tobacco abuse  No family history on file. ***  Social History:   reports that he has been smoking. He has never used smokeless tobacco. No history on file for alcohol use and drug use. Patient is an every day smoker. UDS 02/10/21 positive for cocaine.  Medications No current facility-administered medications for this encounter.  Current Outpatient Medications:  .  amLODipine (NORVASC) 5 MG tablet, Take 1 tablet (5 mg total) by mouth daily., Disp: 30 tablet, Rfl: 1 .  hydrochlorothiazide (HYDRODIURIL) 25 MG tablet, Take 1 tablet (25 mg total) by mouth daily. (Patient not taking: No sig reported), Disp: 30 tablet, Rfl: 1  Exam: Current vital  signs: There were no vitals taken for this visit. Vital signs in last 24 hours:    GENERAL: Awake, alert in NAD HEENT: - Normocephalic and atraumatic, dry mm, no LN++, no Thyromegaly LUNGS - Normal respiratory effort. SaO2 CV - RRR on tele ABDOMEN - Soft, nontender Ext: warm, well perfused  NEURO:  Mental Status: AA&Ox3 *** Speech/Language: speech is _____.  Naming, repetition, fluency, and comprehension intact.*** Cranial Nerves:  II: PERRL____mm/brisk. visual fields full.  III, IV, VI: EOMI. Lid elevation symmetric and full.  V: Sensation is intact to light touch and symmetrical to face. Blinks to threat. Moves jaw back and forth.  VII: Face is symmetric resting and smiling. Able to puff cheeks and raise eyebrows.  VIII: Hearing intact to voice IX, X: Palate elevation is symmetric. Phonation normal.  XI: Normal sternocleidomastoid and trapezius muscle strength XII: Tongue protrudes midline without fasciculations.   Motor: 5/5 strength is all muscle groups.  Tone is normal. Bulk is normal.  Sensation- Intact to light touch bilaterally in all four extremities. No extinction to DSS present.  Coordination: FTN intact bilaterally. HKS intact bilaterally. No pronator drift. Alternating hand movements.  DTRs: 2+ throughout.  Gait- deferred  1a Level of Conscious.:  1b LOC Questions:  1c LOC Commands:  2 Best Gaze:  3 Visual:  4 Facial Palsy:  5a Motor Arm - left:  5b Motor Arm - Right:  6a Motor Leg - Left:  6b Motor Leg - Right:  7 Limb Ataxia:  8 Sensory:  9 Best Language:  10 Dysarthria:  11 Extinct. and Inatten.:  TOTAL:   Labs I have reviewed labs in epic and the results pertinent to this consultation are: CBC    Component Value Date/Time   WBC 6.5 02/21/2021 1730   RBC 5.42 02/21/2021 1730   HGB 16.3 02/21/2021 1737   HCT 48.0 02/21/2021 1737   PLT 315 02/21/2021 1730   MCV 91.0 02/21/2021 1730   MCH 29.0 02/21/2021 1730   MCHC 31.8 02/21/2021 1730   RDW  12.4 02/21/2021 1730   LYMPHSABS 1.0 02/21/2021 1730   MONOABS 0.9 02/21/2021 1730   EOSABS 0.1 02/21/2021 1730   BASOSABS 0.0 02/21/2021 1730   CMP     Component Value Date/Time   NA 141 02/21/2021 1737   K 4.4 02/21/2021 1737   CL 105 02/21/2021 1737   CO2 27 02/10/2021 0916   GLUCOSE 118 (H) 02/21/2021 1737   BUN 16 02/21/2021 1737   CREATININE 1.10 02/21/2021 1737   CALCIUM 8.8 (L) 02/10/2021 0916   PROT 7.0 02/10/2021 0916   ALBUMIN 3.5 02/10/2021 0916   AST 29 02/10/2021 0916   ALT 28 02/10/2021 0916   ALKPHOS 47 02/10/2021 0916   BILITOT 1.0 02/10/2021 0916   GFRNONAA >60 02/10/2021 0916   GFRAA >60 06/15/2019 1049   Lipid Panel     Component Value Date/Time   CHOL 111 02/10/2021 0916   TRIG 64 02/10/2021 0916   HDL 46 02/10/2021 0916   CHOLHDL 2.4 02/10/2021 0916   VLDL 13 02/10/2021 0916   LDLCALC 52 02/10/2021 0916   Imaging I have reviewed the images obtained:***  02/21/21 CT-scan of the brain: IMPRESSION: Approximately 5.2 cm acute intraparenchymal hemorrhage in the lateral right basal ganglia, as detailed above. Mass effect results in effacement of the right lateral ventricle and slight (2 mm) of leftward midline shift.  MRI examination of the brain  Assessment: ***  Impression:***  Recommendations: - - - -  Pt seen by NP/Neuro and later by MD. Note/plan to be edited by MD as needed.  Anibal Henderson, AGAC-NP [facility] Pager: [phone]

## 2021-02-21 NOTE — ED Provider Notes (Signed)
[facility] EMERGENCY DEPARTMENT Provider Note   CSN: [id number] Arrival date & time: 02/21/21  1727     History No chief complaint on file.   Mcadoo Zell is a 60 y.o. male.  Patient presents the ER as a stroke activation.  He was last seen normal around 11:30 AM by his roommate.  Patient states he has been in bed since then.  He try to get up about 30 minutes ago when he noticed that his left arm and left leg were weak and ambulance was called.  Otherwise no reports of fevers or cough or vomiting or diarrhea.        Past Medical History:  Diagnosis Date  . Hypertension     Patient Active Problem List   Diagnosis Date Noted  . ICH (intracerebral hemorrhage) ([place]) 02/21/2021    No past surgical history on file.     No family history on file.  Social History   Tobacco Use  . Smoking status: Current Every Day Smoker  . Smokeless tobacco: Never Used    Home Medications Prior to Admission medications   Medication Sig Start Date End Date Taking? Authorizing Provider  amLODipine (NORVASC) 5 MG tablet Take 1 tablet (5 mg total) by mouth daily. 02/10/21   Isla Pence, MD  hydrochlorothiazide (HYDRODIURIL) 25 MG tablet Take 1 tablet (25 mg total) by mouth daily. Patient not taking: No sig reported 09/30/20   Henderly, Britni A, PA-C    Allergies    Patient has no known allergies.  Review of Systems   Review of Systems  Unable to perform ROS: Acuity of condition    Physical Exam Updated Vital Signs BP 131/81   Pulse 67   Resp 13   SpO2 98%   Physical Exam Constitutional:      General: He is not in acute distress.    Appearance: He is well-developed.  HENT:     Head: Normocephalic.     Nose: Nose normal.  Eyes:     Comments: Patient appears to have a right-sided gaze preference.,  Extraocular motion appears intact with perhaps some weakness to left lateral gaze.  Neck:     Comments: 15 x 20 cm soft left-sided neck mass,  clinically suspect lipoma. Cardiovascular:     Rate and Rhythm: Normal rate.  Pulmonary:     Effort: Pulmonary effort is normal.  Skin:    Coloration: Skin is not jaundiced.  Neurological:     Mental Status: He is alert.     Comments: Flaccid weakness of the left upper and left lower extremity.  Right-sided gaze preference is present.  Left facial droop noted.  No dysphagia noted.     ED Results / Procedures / Treatments   Labs (all labs ordered are listed, but only abnormal results are displayed) Labs Reviewed  I-STAT CHEM 8, ED - Abnormal; Notable for the following components:      Result Value   Glucose, Bld 118 (*)    Calcium, Ion 1.07 (*)    All other components within normal limits  RESP PANEL BY RT-PCR (FLU A&B, COVID) ARPGX2  CBC  DIFFERENTIAL  ETHANOL  COMPREHENSIVE METABOLIC PANEL  RAPID URINE DRUG SCREEN, HOSP PERFORMED  URINALYSIS, ROUTINE W REFLEX MICROSCOPIC  PROTIME-INR  APTT  HIV ANTIBODY (ROUTINE TESTING W REFLEX)    EKG None  Radiology CT HEAD CODE STROKE WO CONTRAST  Result Date: 02/21/2021 CLINICAL DATA:  Code stroke. EXAM: CT HEAD WITHOUT CONTRAST TECHNIQUE: Contiguous axial  images were obtained from the base of the skull through the vertex without intravenous contrast. COMPARISON:  None. FINDINGS: Brain: Acute intraparenchymal hemorrhage centered in the lateral aspect of the right basal ganglia, measuring up to 5.2 by 2.8 by 4.0 cm (AP by transverse by craniocaudal) with estimated volume of approximately 29 mL. Mild surrounding edema. Associated mass effect with partial effacement of the right lateral ventricle and trace (2 mm) of leftward midline shift at the foramina Turner. No evidence of intraventricular extension. No hydrocephalus. No specific evidence of acute large vascular territory infarct. Vascular: No hyperdense vessel identified. Skull: No acute fracture. Sinuses/Orbits: Polyps versus retention cyst in bilateral maxillary sinuses. No acute  orbital findings. Other: No mastoid effusions. IMPRESSION: Approximately 5.2 cm acute intraparenchymal hemorrhage in the lateral right basal ganglia, as detailed above. Mass effect results in effacement of the right lateral ventricle and slight (2 mm) of leftward midline shift. Code stroke imaging results were communicated on 02/21/2021 at 5:42 pm to provider Dr. Annice Pih via secure text paging. Also discussed with Dr. Milas Gain at 5:46 p.m. via telephone. Electronically Signed   By: Margaretha Sheffield MD   On: 02/21/2021 17:51    Procedures .Critical Care E&M Performed by: Luna Fuse, MD  Critical care provider statement:    Critical care time (minutes):  30 After initial E/M assessment, critical care services were subsequently performed that were exclusive of separately billable procedures or treatment.   Comments:     ICH     Medications Ordered in ED Medications   stroke: mapping our early stages of recovery book (has no administration in time range)  acetaminophen (TYLENOL) tablet 650 mg (has no administration in time range)    Or  acetaminophen (TYLENOL) 160 MG/5ML solution 650 mg (has no administration in time range)    Or  acetaminophen (TYLENOL) suppository 650 mg (has no administration in time range)  senna-docusate (Senokot-S) tablet 1 tablet (has no administration in time range)  pantoprazole (PROTONIX) injection 40 mg (has no administration in time range)  labetalol (NORMODYNE) injection 20 mg (20 mg Intravenous Given 02/21/21 1741)    And  clevidipine (CLEVIPREX) infusion 0.5 mg/mL (8 mg/hr Intravenous Rate/Dose Change 02/21/21 1758)  iohexol (OMNIPAQUE) 350 MG/ML injection 75 mL (75 mLs Intravenous Contrast Given 02/21/21 1748)    ED Course  I have reviewed the triage vital signs and the nursing notes.  Pertinent labs & imaging results that were available during my care of the patient were reviewed by me and considered in my medical decision making (see chart for  details).    MDM Rules/Calculators/A&P                          Stroke activation prior to arrival.  Patient seen by neuro hospitalist.  CT imaging concerning for intracranial hemorrhage.  Patient admitted to the neuro ICU.  Cleviprex gtt started.  Final Clinical Impression(s) / ED Diagnoses Final diagnoses:  Intraparenchymal hemorrhage of brain ([place])  Hypertension, unspecified type    Rx / DC Orders ED Discharge Orders    None       Luna Fuse, MD 02/21/21 1807

## 2021-02-21 NOTE — H&P (Signed)
NEUROLOGY CONSULTATION NOTE   Date of service: February 21, 2021 Patient Name: Chad Coleman MRN:  [id number] DOB:  1961-02-02 Reason for consult: "stroke code" _ _ _   _ __   _ __ _ _  __ __   _ __   __ _  History of Present Illness   HPI: Chad Coleman is a 60 y.o. male with a medical history significant for hypertension, tobacco use, noncompliance with antihypertensive medications, and cocaine abuse who presents to the ED via EMS as a Code Stroke for evaluation of acute onset of left-sided weakness, numbness, and left facial droop. Last seen normal by his roommate at 11:30 this morning but the patient states that his weakness onset was approximately 45-minutes prior to hospital arrival (arrivaed at 17:27). Of note was a recent ER visit where he was found to be hypertensive and reported that he had been out of his hydrochlorothiazide and felt that it did not help with his hypertension but just caused polyuria.  NIHSS components Score: Comment  1a Level of Conscious 0[x]  1[]  2[]  3[]      1b LOC Questions 0[x]  1[]  2[]       1c LOC Commands 0[x]  1[]  2[]       2 Best Gaze 0[]  1[x]  2[]       3 Visual 0[]  1[]  2[x]  3[]      4 Facial Palsy 0[]  1[x]  2[]  3[]      5a Motor Arm - left 0[]  1[]  2[]  3[x]  4[]  UN[]    5b Motor Arm - Right 0[x]  1[]  2[]  3[]  4[]  UN[]    6a Motor Leg - Left 0[]  1[]  2[]  3[x]  4[]  UN[]    6b Motor Leg - Right 0[x]  1[]  2[]  3[]  4[]  UN[]    7 Limb Ataxia 0[x]  1[]  2[]  3[]  UN[]     8 Sensory 0[]  1[]  2[x]  UN[]      9 Best Language 0[x]  1[]  2[]  3[]      10 Dysarthria 0[]  1[x]  2[]  UN[]      11 Extinct. and Inattention 0[x]  1[]  2[]       TOTAL: 13   MRS: 0 TPA: Not a candidate due to [place] Thrombectomy: No LVO. ICH Score: 0. ICH volume: approx 50ml.    ROS   Constitutional Denies weight loss, fever and chills.   HEENT Denies changes in vision and hearing.   Respiratory Denies SOB and cough.   CV Denies palpitations and CP   GI Denies abdominal pain, nausea, vomiting and diarrhea.   GU  Denies dysuria and urinary frequency.   MSK Denies myalgia and joint pain.   Skin Denies rash and pruritus.   Neurological Denies headache and syncope.   Psychiatric Denies recent changes in mood. Denies anxiety and depression.    Past History   Past Medical History:  Diagnosis Date  . Hypertension    No past surgical history on file. No family history on file. Social History   Socioeconomic History  . Marital status: Single    Spouse name: Not on file  . Number of children: Not on file  . Years of education: Not on file  . Highest education level: Not on file  Occupational History  . Not on file  Tobacco Use  . Smoking status: Current Every Day Smoker  . Smokeless tobacco: Never Used  Substance and Sexual Activity  . Alcohol use: Not on file  . Drug use: Not on file  . Sexual activity: Not on file  Other Topics Concern  . Not on file  Social History Narrative  .  Not on file   Social Determinants of Health   Financial Resource Strain: Not on file  Food Insecurity: Not on file  Transportation Needs: Not on file  Physical Activity: Not on file  Stress: Not on file  Social Connections: Not on file   No Known Allergies  Medications  (Not in a hospital admission)    Vitals   There were no vitals filed for this visit.   There is no height or weight on file to calculate BMI.  Physical Exam   General: Laying comfortably in bed; in no acute distress.  HENT: Normal oropharynx and mucosa. Normal external appearance of ears and nose.  Neck: Supple, no pain or tenderness  CV: No JVD. No peripheral edema.  Pulmonary: Symmetric Chest rise. Normal respiratory effort.  Abdomen: Soft to touch, non-tender.  Ext: No cyanosis, edema, or deformity  Skin: No rash. Normal palpation of skin.   Musculoskeletal: Normal digits and nails by inspection. No clubbing.   Neurologic Examination  Mental status/Cognition: Alert, oriented to self, place, month and year, good  attention.  Speech/language: Mildly dysarthric speech. Fluent, comprehension intact, object naming intact, repetition intact.  Cranial nerves:   CN II Pupils equal and reactive to light, no VF deficits    CN III,IV,VI R gaze deviation but can cross midline.   CN V normal sensation in V1, V2, and V3 segments bilaterally   CN VII L facial droop   CN VIII normal hearing to speech   CN IX & X normal palatal elevation, no uvular deviation   CN XI    CN XII midline tongue protrusion   Motor:  Muscle bulk: normal, tone flaccid in LUE and LLE RUE: 5/5 LUE: 1/5 RLE: 5.5 LLE: 1/5  Reflexes:  Right Left Comments  Pectoralis      Biceps (C5/6) 2 2   Brachioradialis (C5/6) 2 2    Triceps (C6/7) 2 2    Patellar (L3/4) 2 2    Achilles (S1)      Hoffman      Plantar     Jaw jerk    Sensation:  Light touch Decreased in L face, LUE and LLE   Pin prick    Temperature    Vibration   Proprioception    Coordination/Complex Motor:  - Finger to Nose intact on the right. - Heel to shin unable to assess - Gait: Deferred.  Labs   CBC:  Recent Labs  Lab 02/21/21 1737  HGB 16.3  HCT 54.2    Basic Metabolic Panel:  Lab Results  Component Value Date   NA 141 02/21/2021   K 4.4 02/21/2021   CO2 27 02/10/2021   GLUCOSE 118 (H) 02/21/2021   BUN 16 02/21/2021   CREATININE 1.10 02/21/2021   CALCIUM 8.8 (L) 02/10/2021   GFRNONAA >60 02/10/2021   GFRAA >60 06/15/2019   Lipid Panel:  Lab Results  Component Value Date   LDLCALC 52 02/10/2021   HgbA1c: No results found for: HGBA1C Urine Drug Screen:     Component Value Date/Time   LABOPIA POSITIVE (A) 02/10/2021 0838   COCAINSCRNUR POSITIVE (A) 02/10/2021 0838   LABBENZ NONE DETECTED 02/10/2021 0838   AMPHETMU NONE DETECTED 02/10/2021 0838   THCU NONE DETECTED 02/10/2021 0838   LABBARB NONE DETECTED 02/10/2021 0838    Alcohol Level No results found for: [place]  CT Head without contrast: R BG ICH. ICH Volume of 39ml.  CT  angio Head and Neck with contrast: No aneurysm, no  LVO per my read.  MRI Brain pending.  Impression   Chad Coleman is a 60 y.o. male with PMH significant for HTN presenting with Right basal ganglia ICH, likely hypertensive. His neurologic examination is notable for left sided weakness, visual field cut, left sensory deficit and R gaze preference.  ICH score of 0.  Recommendations  - Admit to ICU - Stability scan in 6 hours or STAT with any neurological decline - Frequent neuro checks; q76min for 1 hour, then q1hour - No antiplatelets or anticoagulants due to [place] - SCD for DVT prophylaxis, add heparin at 24 hours if stable - Blood pressure control with goal systolic 791 - 504, cleverplex and labetalol PRN - Stroke labs, HgbA1c, fasting lipid panel - MRI brain with and without contrast when stabilized to evaluate for underlying mass - Risk factor modification - Echocardiogram - Telemetry monitoring; 30 day event monitor on discharge if no arrythmias captured  - PT consult, OT consult, Speech consult when patient stabilized  - Stroke team to follow - UDS - INR < 1.4, platelet > 100.(INR sample hemolyzed, repeat is pending)  HTN: - goal SBP as above. - PRN Cleviprex and labetalol ______________________________________________________________________   Thank you for the opportunity to take part in the care of this patient. If you have any further questions, please contact the neurology consultation attending.  Signed,  [place] Pager Number [phone] _ _ _   _ __   _ __ _ _  __ __   _ __   __ _

## 2021-02-22 ENCOUNTER — Inpatient Hospital Stay (HOSPITAL_COMMUNITY): Payer: Medicaid Other

## 2021-02-22 DIAGNOSIS — I6389 Other cerebral infarction: Secondary | ICD-10-CM

## 2021-02-22 LAB — HEMOGLOBIN A1C
Hgb A1c MFr Bld: 6.4 % — ABNORMAL HIGH (ref 4.8–5.6)
Mean Plasma Glucose: 136.98 mg/dL

## 2021-02-22 LAB — GLUCOSE, CAPILLARY
Glucose-Capillary: 109 mg/dL — ABNORMAL HIGH (ref 70–99)
Glucose-Capillary: 127 mg/dL — ABNORMAL HIGH (ref 70–99)
Glucose-Capillary: 140 mg/dL — ABNORMAL HIGH (ref 70–99)
Glucose-Capillary: 144 mg/dL — ABNORMAL HIGH (ref 70–99)
Glucose-Capillary: 161 mg/dL — ABNORMAL HIGH (ref 70–99)
Glucose-Capillary: 179 mg/dL — ABNORMAL HIGH (ref 70–99)
Glucose-Capillary: 183 mg/dL — ABNORMAL HIGH (ref 70–99)

## 2021-02-22 LAB — URINALYSIS, ROUTINE W REFLEX MICROSCOPIC
Bilirubin Urine: NEGATIVE
Glucose, UA: NEGATIVE mg/dL
Hgb urine dipstick: NEGATIVE
Ketones, ur: NEGATIVE mg/dL
Leukocytes,Ua: NEGATIVE
Nitrite: NEGATIVE
Protein, ur: 30 mg/dL — AB
Specific Gravity, Urine: 1.028 (ref 1.005–1.030)
pH: 6 (ref 5.0–8.0)

## 2021-02-22 LAB — ECHOCARDIOGRAM COMPLETE
Area-P 1/2: 2.34 cm2
S' Lateral: 3.7 cm

## 2021-02-22 LAB — RAPID URINE DRUG SCREEN, HOSP PERFORMED
Amphetamines: NOT DETECTED
Barbiturates: NOT DETECTED
Benzodiazepines: NOT DETECTED
Cocaine: POSITIVE — AB
Opiates: NOT DETECTED
Tetrahydrocannabinol: NOT DETECTED

## 2021-02-22 LAB — APTT: aPTT: 29 seconds (ref 24–36)

## 2021-02-22 LAB — PROTIME-INR
INR: 1 (ref 0.8–1.2)
Prothrombin Time: 12.9 seconds (ref 11.4–15.2)

## 2021-02-22 LAB — MRSA PCR SCREENING: MRSA by PCR: NEGATIVE

## 2021-02-22 MED ORDER — HYDRALAZINE HCL 20 MG/ML IJ SOLN: 20.0000 mg | Freq: Four times a day (QID) | INTRAMUSCULAR | Status: DC | PRN

## 2021-02-22 MED ORDER — LABETALOL HCL 5 MG/ML IV SOLN
20.0000 mg | INTRAVENOUS | Status: DC | PRN
  Administered 2021-02-22 – 2021-02-23 (×4): 20 mg via INTRAVENOUS
  Filled 2021-02-22 (×3): qty 4

## 2021-02-22 MED ORDER — AMLODIPINE BESYLATE 5 MG PO TABS
5.0000 mg | ORAL_TABLET | Freq: Every day | ORAL | Status: DC
  Administered 2021-02-22 – 2021-02-24 (×3): 5 mg via ORAL
  Filled 2021-02-22 (×3): qty 1

## 2021-02-22 MED ORDER — HYDRALAZINE HCL 20 MG/ML IJ SOLN
20.0000 mg | Freq: Four times a day (QID) | INTRAMUSCULAR | Status: DC | PRN
  Administered 2021-02-23 – 2021-03-03 (×10): 20 mg via INTRAVENOUS
  Filled 2021-02-22 (×11): qty 1

## 2021-02-22 MED ORDER — LABETALOL HCL 5 MG/ML IV SOLN
INTRAVENOUS | Status: AC
  Filled 2021-02-22: qty 4

## 2021-02-22 MED ORDER — GADOBUTROL 1 MMOL/ML IV SOLN
7.0000 mL | Freq: Once | INTRAVENOUS | Status: AC | PRN
  Administered 2021-02-22: 7 mL via INTRAVENOUS

## 2021-02-22 NOTE — Evaluation (Signed)
Physical Therapy Evaluation Patient Details Name: Chad Coleman MRN: [id number] DOB: Oct 01, 1961 Today's Date: 02/22/2021   History of Present Illness  60 yo male presents to [facility] on 3/28 with acute onset of L weakness, numbness, and L facial droop. CTH R BG ICH, suspect hypertensive. PMH includes hypertension, tobacco use, noncompliance with antihypertensive medications, and cocaine abuse.  Clinical Impression   Pt presents with L hemiplegia, LLE posterior hypertonia, impaired seated balance with pushing towards L noted, L inattention with dysconjugate gaze, difficulty performing mobility tasks, poor to zero standing balance with LLE buckling, and decreased activity tolerance vs baseline. Pt to benefit from acute PT to address deficits. Pt currently requires mod-max +2 assist for bed mobility and transfer to standing, pt with poor midline awareness and is significantly limited by L hemiplegia. PT recommending CIR to maximize functional recovery and independence. PT to progress mobility as tolerated, and will continue to follow acutely.      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 Verbal cuing for sequencing task, using bedrails. Increased time.    Transfers Overall transfer level: Needs  assistance Equipment used: 2 person hand held assist Transfers: Sit to/from Stand Sit to Stand: Mod assist;+2 physical assistance;+2 safety/equipment;From elevated surface         General transfer comment: mod +2 for power up, rise, and steadying. Weight shifting L and R with L knee blocking, cues for upright posture multiple times and weight shifting.  Ambulation/Gait             General Gait Details: NT today, pt fatiguing and reporting increased R shoulder pain in standing  Stairs            Wheelchair Mobility    Modified Rankin (Stroke Patients Only) Modified Rankin (Stroke Patients Only) Pre-Morbid Rankin Score: No symptoms Modified Rankin: Severe disability     Balance Overall balance assessment: Needs assistance Sitting-balance support: Single extremity supported;Feet supported Sitting balance-Leahy Scale: Poor Sitting balance - Comments: requires at least mod posterior assist to maintain upright balance, heavy L lateral leaning with R hand on bed. R elbow to upright x3, pt unable to stop in midline when cued to do so Postural control: Left lateral lean Standing balance support: Bilateral upper extremity supported;During functional activity Standing balance-Leahy Scale: Zero Standing balance comment: reliant on PT/OT                             Pertinent Vitals/Pain Pain Assessment: Faces Faces Pain Scale: Hurts even more Pain Location: R shoulder Pain Descriptors / Indicators: Aching;Guarding;Grimacing Pain Intervention(s): Limited activity within patient's tolerance;Monitored during session;Repositioned    Home Living Family/patient expects to be discharged to:: Group home (Boarding house) Living Arrangements: Alone   Type of Home: House Home Access: Stairs to enter   [employer]  of Steps: 6-7     Additional Comments: Roommates    Prior Function Level of Independence: Independent         Comments: pt reports he  likes to walk outdoors     Hand Dominance   Dominant Hand: Left (Pt reporting he is left handed but reported to SLP he was right handed)    Extremity/Trunk Assessment   Upper Extremity Assessment Upper Extremity Assessment: Defer to OT evaluation LUE Deficits / Details: Increased tone with tendency for flexed positioning. Able to perform PROM WFL. inattention to LUE. LUE Coordination: decreased fine motor;decreased gross motor    Lower Extremity Assessment Lower Extremity Assessment: LLE deficits/detail LLE Deficits / Details: 1/5 knee flexion, hip flexion. 0/5 knee extension, DF/PF. Hypertonicity L hamstrings LLE Sensation: decreased light touch    Cervical / Trunk Assessment Cervical / Trunk Assessment: Other exceptions Cervical / Trunk Exceptions: Pushing towards L sitting EOB  Communication   Communication: No difficulties  Cognition Arousal/Alertness: Lethargic (periods of drowsiness, followed by periods of wakefulness) Behavior During Therapy: Flat affect Overall Cognitive Status: Impaired/Different from baseline Area of Impairment: Memory;Attention;Following commands;Safety/judgement;Problem solving                   Current Attention Level: Sustained Memory: Decreased short-term memory Following Commands: Follows one step commands with increased time;Follows one step commands inconsistently Safety/Judgement: Decreased awareness of safety;Decreased awareness of deficits   Problem Solving: Slow processing;Decreased initiation;Requires verbal cues;Requires tactile cues;Difficulty sequencing General Comments: pt A&Ox4, states "I had a stroke". Pt perseverative on receiving "water", even when told repeatedly he cannot. Pt with poor awareness of midline and safe sitting EOB, contraversive pushing noted towards L with little awareness of this. Pt follows most mobility commands, at times requires multimodal cuing to complete. Pt with periods of eye closing during session,  opens eyes on command. L inattention, attends with visual and verbal cuing.      General Comments General comments (skin integrity, edema, etc.): VSS during mobility, SBP<140 during session    Exercises     Assessment/Plan    PT Assessment Patient needs continued PT services  PT Problem List Decreased strength;Decreased mobility;Decreased safety awareness;Decreased activity tolerance;Decreased knowledge of use of DME;Decreased balance;Pain;Decreased cognition;Cardiopulmonary status limiting activity;Impaired sensation;Decreased knowledge of precautions;Decreased coordination;Decreased range of motion;Impaired tone       PT Treatment Interventions DME instruction;Therapeutic activities;Gait training;Therapeutic exercise;Patient/family education;Balance training;Stair training;Neuromuscular re-education;Functional mobility training    PT Goals (Current goals can be found in the Care Plan section)  Acute Rehab PT Goals Patient Stated Goal: get some water PT Goal Formulation: With patient Time For Goal Achievement: 03/08/21 Potential to Achieve Goals: Good    Frequency Min 4X/week   Barriers to discharge Decreased caregiver support has a roommate, unsure of how much assist he will be able to provide    Co-evaluation PT/OT/SLP Co-Evaluation/Treatment: Yes Reason for Co-Treatment: For patient/therapist safety;To address functional/ADL transfers;Necessary to address cognition/behavior during functional activity PT goals addressed during session: Mobility/safety with mobility;Strengthening/ROM;Balance OT goals addressed during session: ADL's and self-care       AM-PAC PT "6 Clicks" Mobility  Outcome Measure Help needed turning from your back to your side while in a flat bed without using bedrails?: A Lot Help needed moving from lying on your back to sitting on the side of a flat bed without using bedrails?: A Lot Help needed moving to and from a bed to a chair (including a  wheelchair)?: Total Help needed standing up from a chair using  your arms (e.g., wheelchair or bedside chair)?: Total Help needed to walk in hospital room?: Total Help needed climbing 3-5 steps with a railing? : Total 6 Click Score: 8    End of Session   Activity Tolerance: Patient limited by fatigue;Patient limited by pain Patient left: in bed;with call bell/phone within reach;with bed alarm set;with nursing/sitter in room Nurse Communication: Mobility status PT Visit Diagnosis: Hemiplegia and hemiparesis;Unsteadiness on feet (R26.81) Hemiplegia - Right/Left: Left Hemiplegia - dominant/non-dominant: Dominant Hemiplegia - caused by: Nontraumatic intracerebral hemorrhage    Time: 1884-1660 PT Time Calculation (min) (ACUTE ONLY): 23 min   Charges:   PT Evaluation $PT Eval Low Complexity: 1 Low          Illyana Schorsch S, PT Acute Rehabilitation Services Pager [phone]  Office [phone]   Louis Matte 02/22/2021, 2:33 PM

## 2021-02-22 NOTE — TOC CAGE-AID Note (Signed)
Transition of Care [facility]) - CAGE-AID Screening   Patient Details  Name: Chad Coleman MRN: [id number] Date of Birth: 1961/08/12  Transition of Care [facility] CM/SW Contact:    Benard Halsted, LCSW Phone Number: 02/22/2021, 12:26 PM   Clinical Narrative: CSW met with patient at bedside. He reported that he uses cocaine recreationally but not frequently and does not feel that it is an issue that is currently affecting him negatively, regardless of this hospitalization. He declined resources.   CSW inquired as to patient's home situation. He reported he lives in a "group home" and has a roommate. He does not know the name of it. He stated the address is: [place], which CSW confirmed based off of the EMS Run sheet pickup location. He stated that his roommate is available to help his as needed and he will be able to call the group home for a ride home when needed. Patient denied any other questions at this time.    CAGE-AID Screening:    Have You Ever Felt You Ought to Cut Down on Your Drinking or Drug Use?: No Have People Annoyed You By Critizing Your Drinking Or Drug Use?: No Have You Felt Bad Or Guilty About Your Drinking Or Drug Use?: No Have You Ever Had a Drink or Used Drugs First Thing In The Morning to Steady Your Nerves or to Get Rid of a Hangover?: No CAGE-AID Score: 0  Substance Abuse Education Offered: Yes  Substance abuse interventions: Patient Counseling

## 2021-02-22 NOTE — Evaluation (Signed)
Clinical/Bedside Swallow Evaluation Patient Details  Name: Chad Coleman MRN: [id number] Date of Birth: 01-Sep-1961  Today's Date: 02/22/2021 Time: SLP Start Time (ACUTE ONLY): 1000 SLP Stop Time (ACUTE ONLY): 1012 SLP Time Calculation (min) (ACUTE ONLY): 12 min  Past Medical History:  Past Medical History:  Diagnosis Date  . Hypertension    Past Surgical History: No past surgical history on file. HPI:  60 y.o. male with a medical history significant for hypertension, tobacco use, noncompliance with antihypertensive medications, and cocaine abuse who presented to the ED via EMS as a Code Stroke for evaluation of acute onset of left-sided weakness, numbness, and left facial droop. Dx right basal ganglia ICH.   Assessment / Plan / Recommendation Clinical Impression  Pt presents with s/s of a neurogenic dysphagia with focal CN deficits CN V, VII on left, reduced oral awareness with spillage and residue within left oral cavity, intermittent coughing with thin liquids and purees. Deficits are compounded by impulsivity and poor awareness. Recommend maintaining NPO status until an MBS can be completed this afternoon to ascertain degree of dysphagia. D/W RN and pt. Scheduled for 1230. SLP Visit Diagnosis: Dysphagia, oropharyngeal phase (R13.12)    Aspiration Risk    TBD   Diet Recommendation   NPO       Other  Recommendations Oral Care Recommendations: Oral care QID   Follow up Recommendations   TBD       Swallow Study   General Date of Onset: 02/21/21 HPI: 60 y.o. male with a medical history significant for hypertension, tobacco use, noncompliance with antihypertensive medications, and cocaine abuse who presented to the ED via EMS as a Code Stroke for evaluation of acute onset of left-sided weakness, numbness, and left facial droop. Dx right basal ganglia ICH. Type of Study: Bedside Swallow Evaluation Previous Swallow Assessment: no Diet Prior to this Study: NPO Temperature Spikes  Noted: Yes Respiratory Status: Room air History of Recent Intubation: No Behavior/Cognition: Alert Oral Cavity Assessment: Within Functional Limits Oral Care Completed by SLP: Recent completion by staff Oral Cavity - Dentition: Missing dentition Vision: Functional for self-feeding Self-Feeding Abilities: Needs assist;Needs set up Patient Positioning: Upright in bed Baseline Vocal Quality: Normal Volitional Cough: Strong Volitional Swallow: Able to elicit    Oral/Motor/Sensory Function Overall Oral Motor/Sensory Function: Mild impairment Facial ROM: Reduced left;Suspected CN VII (facial) dysfunction Facial Symmetry: Abnormal symmetry left;Suspected CN VII (facial) dysfunction Facial Sensation: Reduced left;Suspected CN V (Trigeminal) dysfunction Lingual Symmetry: Within Functional Limits Mandible: Within Functional Limits   Ice Chips Ice chips: Within functional limits   Thin Liquid Thin Liquid: Impaired Presentation: Straw Pharyngeal  Phase Impairments: Cough - Delayed    Nectar Thick Nectar Thick Liquid: Not tested   Honey Thick Honey Thick Liquid: Not tested   Puree Puree: Impaired Presentation: Self Fed;Spoon Oral Phase Impairments: Poor awareness of bolus Oral Phase Functional Implications: Left anterior spillage Pharyngeal Phase Impairments: Cough - Delayed   Solid     Solid: Impaired Oral Phase Functional Implications: Prolonged oral transit      Chad Coleman 02/22/2021,10:50 AM  [name], [place] Office number [phone] Pager [phone]

## 2021-02-22 NOTE — Progress Notes (Signed)
OT Cancellation Note  Patient Details Name: Chad Coleman MRN: [id number] DOB: Apr 19, 1961   Cancelled Treatment:    Reason Eval/Treat Not Completed: Active bedrest order (Will return as schedule allows)  [place] MSOT, OTR/L Acute Rehab Pager: [phone] Office: [phone] 02/22/2021, 7:29 AM

## 2021-02-22 NOTE — Progress Notes (Addendum)
STROKE TEAM PROGRESS NOTE   INTERVAL HISTORY When asked what happened he reports "I had a stroke." He states that he felt weak yesterday and had trouble speaking. Per chart review he was last seen normal around 1130 by his roommate. He then tried to get up out of bed and had left sided weakness and called EMS. He reports this started about 30 minutes before he arrived at the hospital (arrived at hospital 1727). His neurological exam is unchanged.  Blood pressure tightly adequately controlled on Cleviprex drip.  He has not yet had swallow eval by speech therapy.  MRI scan of the brain shows slight increased appearance of the right basal ganglia hemorrhage with 3 mm midline shift but no hydrocephalus Vitals:   02/22/21 1045 02/22/21 1145 02/22/21 1200 02/22/21 1300  BP: (!) 149/89 131/81 (!) 127/99 (!) 133/100  Pulse: 76 81 (!) 37   Resp: 17 (!) 22 16   Temp:   99.3 F (37.4 C)   TempSrc:   Oral   SpO2: 96% 97% 98%    CBC:  Recent Labs  Lab 02/21/21 1730 02/21/21 1737  WBC 6.5  --   NEUTROABS 4.3  --   HGB 15.7 16.3  HCT 49.3 48.0  MCV 91.0  --   PLT 315  --    Basic Metabolic Panel:  Recent Labs  Lab 02/21/21 1730 02/21/21 1737  NA 138 141  K 4.6 4.4  CL 106 105  CO2 24  --   GLUCOSE 118* 118*  BUN 12 16  CREATININE 1.17 1.10  CALCIUM 8.9  --    Lipid Panel: No results for input(s): CHOL, TRIG, HDL, CHOLHDL, VLDL, LDLCALC in the last 168 hours. HgbA1c:  Recent Labs  Lab 02/22/21 0536  HGBA1C 6.4*   Urine Drug Screen:  Recent Labs  Lab 02/22/21 0005  LABOPIA NONE DETECTED  COCAINSCRNUR POSITIVE*  LABBENZ NONE DETECTED  AMPHETMU NONE DETECTED  THCU NONE DETECTED  LABBARB NONE DETECTED    Alcohol Level  Recent Labs  Lab 02/21/21 1730  ETH <10    IMAGING past 24 hours CT HEAD WO CONTRAST  Result Date: 02/22/2021 CLINICAL DATA:  Altered mental status, intracranial hemorrhage EXAM: CT HEAD WITHOUT CONTRAST TECHNIQUE: Contiguous axial images were obtained  from the base of the skull through the vertex without intravenous contrast. COMPARISON:  02/21/2021 FINDINGS: Brain: Intraparenchymal hematoma within the right basal ganglia is stable measuring 28 x 55 x 40 mm at axial image # 15/3 and coronal image # 29/5. Mild surrounding cytotoxic edema and mass effect with effacement of the right lateral ventricle and sylvian fissure appears stable. 1-2 mm right to left midline shift appears stable. No interval hemorrhage. Ventricular size is stable. Cerebellum is unremarkable. Vascular: No asymmetric hyperdense vasculature at the skull base. Skull: The calvarium is intact. Sinuses/Orbits: Mucous retention cysts are seen within the maxillary sinuses bilaterally, incompletely visualized. The remaining paranasal sinuses are clear. Orbits are unremarkable. Other: Mastoid air cells and middle ear cavities are clear. IMPRESSION: Stable right basal ganglia intraparenchymal hematoma and associated mild mass effect. No hydrocephalus. No interval hemorrhage. Electronically Signed   By: Fidela Salisbury MD   On: 02/22/2021 01:32   CT HEAD CODE STROKE WO CONTRAST  Result Date: 02/21/2021 CLINICAL DATA:  Code stroke. EXAM: CT HEAD WITHOUT CONTRAST TECHNIQUE: Contiguous axial images were obtained from the base of the skull through the vertex without intravenous contrast. COMPARISON:  None. FINDINGS: Brain: Acute intraparenchymal hemorrhage centered in the lateral aspect  of the right basal ganglia, measuring up to 5.2 by 2.8 by 4.0 cm (AP by transverse by craniocaudal) with estimated volume of approximately 29 mL. Mild surrounding edema. Associated mass effect with partial effacement of the right lateral ventricle and trace (2 mm) of leftward midline shift at the [facility]. No evidence of intraventricular extension. No hydrocephalus. No specific evidence of acute large vascular territory infarct. Vascular: No hyperdense vessel identified. Skull: No acute fracture. Sinuses/Orbits:  Polyps versus retention cyst in bilateral maxillary sinuses. No acute orbital findings. Other: No mastoid effusions. IMPRESSION: Approximately 5.2 cm acute intraparenchymal hemorrhage in the lateral right basal ganglia, as detailed above. Mass effect results in effacement of the right lateral ventricle and slight (2 mm) of leftward midline shift. Code stroke imaging results were communicated on 02/21/2021 at 5:42 pm to provider Dr. Annice Pih via secure text paging. Also discussed with Dr. Milas Gain at 5:46 p.m. via telephone. Electronically Signed   By: Margaretha Sheffield MD   On: 02/21/2021 17:51   CT ANGIO HEAD CODE STROKE  Addendum Date: 02/21/2021   ADDENDUM REPORT: 02/21/2021 18:37 ADDENDUM: These results were called by telephone at the time of interpretation on 02/21/2021 at 6:35 pm to provider [facility] , who verbally acknowledged these results. Additionally, given the presence of retained metallic fragments within the right neck, a contrast-enhanced neck CT (rather than an MRI) should be performed for further evaluation of the described left neck mass. This was also discussed with Dr. Donnetta Simpers at 6:35 p.m. on 02/21/2021. Electronically Signed   By: Kellie Simmering DO   On: 02/21/2021 18:37   Result Date: 02/21/2021 CLINICAL DATA:  Stroke/TIA, assess extracranial arteries. Stroke/TIA, assess intracranial arteries. EXAM: CT ANGIOGRAPHY HEAD AND NECK TECHNIQUE: Multidetector CT imaging of the head and neck was performed using the standard protocol during bolus administration of intravenous contrast. Multiplanar CT image reconstructions and MIPs were obtained to evaluate the vascular anatomy. Carotid stenosis measurements (when applicable) are obtained utilizing NASCET criteria, using the distal internal carotid diameter as the denominator. CONTRAST:  60mL OMNIPAQUE IOHEXOL 350 MG/ML SOLN COMPARISON:  Noncontrast head CT performed earlier today 02/21/2021. FINDINGS: CTA NECK FINDINGS Aortic arch:  Standard aortic branching. The visualized aortic arch is normal in caliber. No hemodynamically significant innominate or proximal subclavian artery stenosis. Right carotid system: CCA and ICA patent within the neck without stenosis. No significant atherosclerotic disease. Left carotid system: CCA and ICA patent within the neck without stenosis. No significant atherosclerotic disease. Vertebral arteries: Vertebral arteries patent within the neck without stenosis. The right vertebral artery is dominant. Skeleton: Cervical dextrocurvature reversal of the expected cervical lordosis. Trace C2-C3 grade 1 anterolisthesis. Cervical spondylosis with multilevel disc space narrowing, disc bulges/disc protrusions, uncovertebral hypertrophy and facet arthrosis. Other neck: Large lobular low-attenuation lesion within the mid to lower left neck, posterior to the left sternocleidomastoid muscle, measuring 7.4 x 7.4 x 8.5 cm (AP x TV x CC) (for instance as seen on series 6, image 104) (series 9, image 155). Additionally, there are retained metallic foreign bodies within the right neck. Upper chest: No consolidation within the imaged lung apices. Review of the MIP images confirms the above findings CTA HEAD FINDINGS Anterior circulation: The intracranial internal carotid arteries are patent. The M1 middle cerebral arteries are patent. No M2 proximal branch occlusion or high-grade proximal stenosis is identified. The anterior cerebral arteries are patent. Hypoplastic A1 left anterior cerebral artery. No intracranial aneurysm or AVM is identified. Specifically, no abnormal enhancement is appreciated  in the region of the acute right basal ganglia hemorrhage. Posterior circulation: The intracranial vertebral arteries are patent. The basilar artery is patent. The posterior cerebral arteries are patent. Posterior communicating arteries are hypoplastic or at bilaterally. Venous sinuses: Within the limitations of contrast timing, no  convincing thrombus. Anatomic variants: As described Review of the MIP images confirms the above findings IMPRESSION: CTA neck: 1. The common carotid, internal carotid and vertebral arteries are patent within the neck without stenosis. 2. 7.4 x 7.4 x 8.5 cm lobulated low-attenuation lesion within the mid to lower left neck. 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 3. Retained metallic fragments within the right neck soft tissues. CTA head: 1. No contrast blush in the region of the acute right basal ganglia hemorrhage (spot sign). 2. No appreciable intracranial aneurysm or AVM. 3. No intracranial large vessel occlusion or proximal high-grade arterial stenosis. Electronically Signed: By: Kellie Simmering DO On: 02/21/2021 18:31   CT ANGIO NECK CODE STROKE  Addendum Date: 02/21/2021   ADDENDUM REPORT: 02/21/2021 18:37 ADDENDUM: These results were called by telephone at the time of interpretation on 02/21/2021 at 6:35 pm to provider [facility] , who verbally acknowledged these results. Additionally, given the presence of retained metallic fragments within the right neck, a contrast-enhanced neck CT (rather than an MRI) should be performed for further evaluation of the described left neck mass. This was also discussed with Dr. Donnetta Simpers at 6:35 p.m. on 02/21/2021. Electronically Signed   By: Kellie Simmering DO   On: 02/21/2021 18:37   Result Date: 02/21/2021 CLINICAL DATA:  Stroke/TIA, assess extracranial arteries. Stroke/TIA, assess intracranial arteries. EXAM: CT ANGIOGRAPHY HEAD AND NECK TECHNIQUE: Multidetector CT imaging of the head and neck was performed using the standard protocol during bolus administration of intravenous contrast. Multiplanar CT image reconstructions and MIPs were obtained to evaluate the vascular  anatomy. Carotid stenosis measurements (when applicable) are obtained utilizing NASCET criteria, using the distal internal carotid diameter as the denominator. CONTRAST:  56mL OMNIPAQUE IOHEXOL 350 MG/ML SOLN COMPARISON:  Noncontrast head CT performed earlier today 02/21/2021. FINDINGS: CTA NECK FINDINGS Aortic arch: Standard aortic branching. The visualized aortic arch is normal in caliber. No hemodynamically significant innominate or proximal subclavian artery stenosis. Right carotid system: CCA and ICA patent within the neck without stenosis. No significant atherosclerotic disease. Left carotid system: CCA and ICA patent within the neck without stenosis. No significant atherosclerotic disease. Vertebral arteries: Vertebral arteries patent within the neck without stenosis. The right vertebral artery is dominant. Skeleton: Cervical dextrocurvature reversal of the expected cervical lordosis. Trace C2-C3 grade 1 anterolisthesis. Cervical spondylosis with multilevel disc space narrowing, disc bulges/disc protrusions, uncovertebral hypertrophy and facet arthrosis. Other neck: Large lobular low-attenuation lesion within the mid to lower left neck, posterior to the left sternocleidomastoid muscle, measuring 7.4 x 7.4 x 8.5 cm (AP x TV x CC) (for instance as seen on series 6, image 104) (series 9, image 155). Additionally, there are retained metallic foreign bodies within the right neck. Upper chest: No consolidation within the imaged lung apices. Review of the MIP images confirms the above findings CTA HEAD FINDINGS Anterior circulation: The intracranial internal carotid arteries are patent. The M1 middle cerebral arteries are patent. No M2 proximal branch occlusion or high-grade proximal stenosis is identified. The anterior cerebral arteries are patent. Hypoplastic A1 left anterior cerebral  artery. No intracranial aneurysm or AVM is identified. Specifically, no abnormal enhancement is appreciated in the region of the  acute right basal ganglia hemorrhage. Posterior circulation: The intracranial vertebral arteries are patent. The basilar artery is patent. The posterior cerebral arteries are patent. Posterior communicating arteries are hypoplastic or at bilaterally. Venous sinuses: Within the limitations of contrast timing, no convincing thrombus. Anatomic variants: As described Review of the MIP images confirms the above findings IMPRESSION: CTA neck: 1. The common carotid, internal carotid and vertebral arteries are patent within the neck without stenosis. 2. 7.4 x 7.4 x 8.5 cm lobulated low-attenuation lesion within the mid to lower left neck. 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 3. Retained metallic fragments within the right neck soft tissues. CTA head: 1. No contrast blush in the region of the acute right basal ganglia hemorrhage (spot sign). 2. No appreciable intracranial aneurysm or AVM. 3. No intracranial large vessel occlusion or proximal high-grade arterial stenosis. Electronically Signed: By: Kellie Simmering DO On: 02/21/2021 18:31    PHYSICAL EXAM Blood pressure (!) 133/100, pulse (!) 37, temperature 99.3 F (37.4 C), temperature source Oral, resp. rate 16, SpO2 98 %.  General: Frail middle-age african [place] male, no apparent distress  Lungs: Symmetrical Chest rise, no labored breathing  Cardio: Regular Rate and Rhythm  Abdomen: Soft, non-tender  Neurological Exam : : drowsy but arousable, oriented, thought content appropriate.  Speech fluent without evidence of aphasia.  Able to follow some commands but difficulty could be due to drowsiness Cranial Nerves: II:  Visual fields grossly normal, pupils equal, round, reactive to light and accommodation III,IV, VI: ptosis not present, extra-ocular motions intact bilaterally  but has right gaze preference V,VII: left sided droop facial light touch sensation normal bilaterally VIII: hearing normal bilaterally IX,X: uvula rises symmetrically XI: bilateral shoulder shrug XII: midline tongue extension without atrophy or fasciculations  Motor: Able to lift Right arm and leg on command. Withdraws Left arm and leg to noxious stimuli but has dense left hemiplegia.. Tone and bulk:normal tone throughout; no atrophy noted Sensation diminished left hemibody touch pinprick sensation Reflexes present on the right and absent on the left. Right plantar downgoing left equivocal. Gait not tested ASSESSMENT/PLAN [name] is a 60 y.o. male with history of hypertension, tobacco use, noncompliance with antihypertensive medications, and cocaine abuse presenting with Left sided weakness and left facial droop. Brought to the ED via EMS as a Code Stroke for evaluation of acute onset of left-sided weakness, numbness, and left facial droop. Last seen normal by his roommate at 11:30 this morning but the patient states that his weakness onset was approximately 45-minutes prior to hospital arrival (arrivaed at 17:27). Of note was a recent ER visit where he was found to be hypertensive and reported that he had been out of his hydrochlorothiazide and felt that it did not help with his hypertension but just caused polyuria.  He is very drowsy on exam today but able to follow commands. Will get MRI to evaluate if there is additional bleeding. Await PT/OT recommendations. Speech recommends Dys 2 diet.  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  Code Stroke CT head - Approximately 5.2 cm acute intraparenchymal hemorrhage in the lateral right basal ganglia, as detailed above.  Mass effect results in effacement of the right lateral ventricle and slight (2 mm) of  leftward midline shift.  CTA head - No contrast blush in the region of the acute right basal ganglia hemorrhage (spot sign). No appreciable intracranial aneurysm or AVM. No intracranial large vessel occlusion or proximal high-grade arterial stenosis.  CTA neck - The common carotid, internal carotid and vertebral arteries are patent within the neck without stenosis. 7.4 x 7.4 x 8.5 cm lobulated low-attenuation lesion within the mid to lower left neck. 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 Retained metallic fragments within the right neck soft tissues.  CT Head 3/29 - Stable right basal ganglia intraparenchymal hematoma and associated mild mass effect. No hydrocephalus. No interval hemorrhage.  MRI  -5.5 x 3.1 cm right basal ganglia hemorrhage with mild surrounding edema and 3 mm leftward midline shift.  This is slightly increased compared with previous CT scan.  Tiny sub centimeter infarcts adjacent to the hemorrhage in the right temporoparietal cortex and posterior high right frontal lobe.  2D Echo - Done Awaiting reading  LDL 52  HgbA1c 6.4  VTE prophylaxis - SCD's    Diet   DIET DYS 2 Room service appropriate? No; Fluid consistency: Thin    No antithrombotic prior to admission, now on No antithrombotic for now given intracranial hemorrhage.   Therapy recommendations:  Pending  Disposition:  Pending  Hypertension  Home meds:  Amlodipine, HCTZ  Stable  Currently on Cleviprex will attempt to wean  Currently on Labetalol 20 mg q2 PRN Systolic >315 for 24 hours . Permissive hypertension (OK if < 220/120) but gradually normalize in 5-7 days . Long-term BP goal normotensive  Hyperlipidemia  Home meds:  none  LDL 52, goal < 70  Add Atorvastatin 10 mg tomorrow   High intensity statin  Atorvastatin 10 mg  Continue statin at discharge  Pre-Diabetes  Home meds: None  HgbA1c 6.4, goal < 7.0  CBGs Recent Labs    02/22/21 0339 02/22/21 0747 02/22/21 1140  GLUCAP 140* 127* 179*      SSI  Other Stroke Risk Factors  Cigarette smoker - advised to stop smoking  Substance abuse - UDS:  THC NONE DETECTED, Cocaine POSITIVE. Patient advised to stop using due to stroke risk.    Hospital day # 1  Fatima Sanger MD Resident I have personally obtained history,examined this patient, reviewed notes, independently viewed imaging studies, participated in medical decision making and plan of care.ROS completed by me personally and pertinent positives fully documented  I have made any additions or clarifications directly to the above note. Agree with note above.  Continue strict blood pressure control with systolic goal below 400 for 24 hours and then below 160 thereafter.  Wean Cleviprex drip and use as needed IV labetalol and hydralazine.  Speech therapy for swallow eval of and if he passes start Norvasc 5 mg daily.  Close neurological monitoring as per intracranial hemorrhage protocol.  Discussed with daughter at the bedside and answered questions. This patient is critically ill and at significant risk of neurological worsening, death and care requires constant monitoring of vital signs, hemodynamics,respiratory and cardiac monitoring, extensive review of multiple databases, frequent neurological assessment, discussion with family, other specialists and medical decision making of high complexity.I have made any additions or clarifications directly to the above note.This critical care time  does not reflect procedure time, or teaching time or supervisory time of PA/NP/Med Resident etc but could involve care discussion time.  I spent 40 minutes of neurocritical care time  in the care of  this patient.      Antony Contras, MD Medical Director [facility] Pager:  [phone] 02/22/2021 3:20 PM   To contact Stroke Continuity provider, please refer to [email or web address] After hours, contact General Neurology

## 2021-02-22 NOTE — Evaluation (Signed)
Speech Language Pathology Evaluation Patient Details Name: Chad Coleman MRN: [id number] DOB: [birth year] Today's Date: 02/22/2021 Time: 1012-1030 SLP Time Calculation (min) (ACUTE ONLY): 18 min  Problem List:  Patient Active Problem List   Diagnosis Date Noted  . ICH (intracerebral hemorrhage) ([place]) 02/21/2021   Past Medical History:  Past Medical History:  Diagnosis Date  . Hypertension    Past Surgical History: No past surgical history on file. HPI:  60 y.o. male with a medical history significant for hypertension, tobacco use, noncompliance with antihypertensive medications, and cocaine abuse who presented to the ED via EMS as a Code Stroke for evaluation of acute onset of left-sided weakness, numbness, and left facial droop. Dx right basal ganglia ICH.   Assessment / Plan / Recommendation Clinical Impression  Pt presents with cognitive impairment s/p right subcortical bleed specific to impulsivity, decreased awareness, left inattention, decreased functional problem-solving and attention.  He is oriented and short-term recall is a strength.  Expressive/receptive language are WNL.  Speech output is fluent. He presents with a moderate dysarthria of speech.  Pt will benefit from SLP f/u while admitted; he will require 24 supervision due to his cognitive deficits - will await OT/PT evals for their input on disposition.    SLP Assessment  SLP Recommendation/Assessment: Patient needs continued Speech Lanaguage Pathology Services SLP Visit Diagnosis: Cognitive communication deficit (R41.841)    Follow Up Recommendations  Other (comment) (tba)    Frequency and Duration min 3x week  2 weeks      SLP Evaluation Cognition  Overall Cognitive Status: Impaired/Different from baseline Arousal/Alertness: Awake/alert Orientation Level: Oriented to person;Oriented to place;Oriented to time;Oriented to situation Attention: Sustained;Selective Sustained Attention: Appears intact Selective  Attention: Impaired Selective Attention Impairment: Verbal basic Memory: Appears intact Awareness: Impaired Awareness Impairment: Emergent impairment Problem Solving: Impaired Problem Solving Impairment: Functional basic;Verbal basic Behaviors: Impulsive Safety/Judgment: Impaired       Comprehension  Auditory Comprehension Overall Auditory Comprehension: Appears within functional limits for tasks assessed Visual Recognition/Discrimination Discrimination: Not tested Reading Comprehension Reading Status: Not tested    Expression Expression Primary Mode of Expression: Verbal Written Expression Dominant Hand: Right   Oral / Motor  Oral Motor/Sensory Function Overall Oral Motor/Sensory Function: Mild impairment Facial ROM: Reduced left;Suspected CN VII (facial) dysfunction Facial Symmetry: Abnormal symmetry left;Suspected CN VII (facial) dysfunction Facial Sensation: Reduced left;Suspected CN V (Trigeminal) dysfunction Lingual Symmetry: Within Functional Limits Mandible: Within Functional Limits Motor Speech Overall Motor Speech: Impaired Respiration: Within functional limits Level of Impairment: Conversation Phonation: Normal Resonance: Within functional limits Articulation: Impaired Level of Impairment: Sentence Intelligibility: Intelligibility reduced Word: 75-100% accurate Phrase: 75-100% accurate Sentence: 50-74% accurate Conversation: 50-74% accurate Motor Planning: Witnin functional limits   GO                    [name] 02/22/2021, 11:00 AM  [name], [place] Office number [phone] Pager [phone]

## 2021-02-22 NOTE — Progress Notes (Signed)
Pt c/o 8/10 pain of headache and R shoulder pain after follow up CT head.  Pt did not pass Yale swallow and refuses suppository tylenol.  Dr. Leonel Ramsay, neurology on call, notified.  Refraining from IV meds that may sedate pt at this time.  No new meds ordered.

## 2021-02-22 NOTE — Progress Notes (Signed)
  Echocardiogram 2D Echocardiogram has been performed.  Chad Coleman 02/22/2021, 11:42 AM

## 2021-02-22 NOTE — Progress Notes (Signed)
Modified Barium Swallow Progress Note  Patient Details  Name: Chad Coleman MRN: [id number] Date of Birth: 03-26-1961  Today's Date: 02/22/2021  Modified Barium Swallow completed.  Full report located under Chart Review in the Imaging Section.  Brief recommendations include the following:  Clinical Impression  Pt presents with a primary oral dysphagia marked by decreased bolus cohesion with anterior loss left side of mouth and posterior loss over base of tongue.  Pt with poor awareness of spillage from left side/lips.  Thin liquids and purees reached pyriforms prior to initiation of pharyngeal swallow.  There was reliable laryngeal vestibule closure with no episodes of penetration nor aspiration.  Normal pharyngeal stripping with no residue post-swallow. Recommend initiating a dysphagia 2/chopped diet with thin liquids; give meds whole in puree. Pt will need assist with tray set-up and supervision for feeding and safety/spills.  SLP will follow to address oral deficits and advance diet when appropriate. D/W RN, who was present for study.   Swallow Evaluation Recommendations       SLP Diet Recommendations: Dysphagia 2 (Fine chop) solids;Thin liquid   Liquid Administration via: Straw;Cup   Medication Administration: Whole meds with puree   Supervision: Staff to assist with self feeding;Full supervision/cueing for compensatory strategies   Compensations: Minimize environmental distractions;Slow rate;Lingual sweep for clearance of pocketing;Monitor for anterior loss       Oral Care Recommendations: Oral care BID      Chad Coleman, [place] Office number [phone] Pager [phone]   [name] 02/22/2021,1:12 PM

## 2021-02-22 NOTE — Evaluation (Signed)
Occupational Therapy Evaluation Patient Details Name: Chad Coleman MRN: [id number] DOB: [birth year] Today's Date: 02/22/2021    History of Present Illness 60 yo male presents to [facility] on 3/28 with acute onset of L weakness, numbness, and L facial droop. CTH R BG ICH, suspect hypertensive. PMH includes hypertension, tobacco use, noncompliance with antihypertensive medications, and cocaine abuse.   Clinical Impression   PTA, pt was living at a group home and was independent. Pt currently requiring Max-Total A for ADLs and Mod A +2 for bed mobility. Pt presenting with decreased cognition, vision, balance, functional use of LUE, and strength. Pt motivated to participate in therapy despite fatigue. Pt will require further acute OT to facilitate safe dc. Recommend dc to CIR for intensive OT to optimize safety, independence with ADLs, and return to PLOF.     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 Verbal cuing for sequencing task, using bedrails. Increased time.    Transfers Overall transfer level: Needs assistance Equipment used: 2 person hand held assist Transfers: Sit to/from Stand Sit to Stand: Mod assist;+2 physical assistance;+2 safety/equipment;From elevated surface          General transfer comment: mod +2 for power up, rise, and steadying. Weight shifting L and R with L knee blocking, cues for upright posture multiple times and weight shifting.    Balance Overall balance assessment: Needs assistance Sitting-balance support: Single extremity supported;Feet supported Sitting balance-Leahy Scale: Poor Sitting balance - Comments: requires at least mod posterior assist to maintain upright balance, heavy L lateral leaning with R hand on bed. R elbow to upright x3, pt unable to stop in midline when cued to do so Postural control: Left lateral lean Standing balance support: Bilateral upper extremity supported;During functional activity Standing balance-Leahy Scale: Zero Standing balance comment: reliant on PT/OT                           ADL either performed or assessed with clinical judgement   ADL Overall ADL's : Needs assistance/impaired Eating/Feeding: NPO   Grooming: Maximal assistance;Minimal assistance;Sitting Grooming Details (indicate cue type and reason): Max A for sitting balance. Min A for initating to bring wash cloth to face                               General ADL Comments: Total A for bathing, dressing, and bed mobility.     Vision   Vision Assessment?: Yes Eye Alignment: Impaired (comment) (Slight disconjugate gaze) Alignment/Gaze Preference: Gaze right;Head turned Tracking/Visual Pursuits: Impaired - to be further tested in functional context Additional Comments: Decreased smooth tracking and noting L eye with delay leading to disconjugate gaze. Tendency for R gaze; able to look L     Perception  Praxis      Pertinent Vitals/Pain Pain Assessment: Faces Faces Pain Scale: Hurts even more Pain Location: R shoulder Pain Descriptors / Indicators: Aching;Guarding;Grimacing Pain Intervention(s): Monitored during session;Limited activity within patient's tolerance;Repositioned     Hand Dominance Left  (Pt reporting he is left handed but reported to SLP he was right handed)   Extremity/Trunk Assessment Upper Extremity Assessment Upper Extremity Assessment: LUE deficits/detail LUE Deficits / Details: Increased tone with tendency for flexed positioning. Able to perform PROM WFL. inattention to LUE. LUE Coordination: decreased fine motor;decreased gross motor   Lower Extremity Assessment Lower Extremity Assessment: Defer to PT evaluation LLE Deficits / Details: 1/5 knee flexion, hip flexion. 0/5 knee extension, DF/PF. Hypertonicity L hamstrings LLE Sensation: decreased light touch   Cervical / Trunk Assessment Cervical / Trunk Assessment: Other exceptions Cervical / Trunk Exceptions: Pushing towards L sitting EOB   Communication Communication Communication: No difficulties   Cognition Arousal/Alertness: Lethargic (periods of drowsiness, followed by periods of wakefulness) Behavior During Therapy: Flat affect Overall Cognitive Status: Impaired/Different from baseline Area of Impairment: Memory;Attention;Following commands;Safety/judgement;Problem solving                   Current Attention Level: Sustained Memory: Decreased short-term memory Following Commands: Follows one step commands with increased time;Follows one step commands inconsistently Safety/Judgement: Decreased awareness of safety;Decreased awareness of deficits   Problem Solving: Slow processing;Decreased initiation;Requires verbal cues;Requires tactile cues;Difficulty sequencing General Comments: pt A&Ox4, states "I had a stroke". Pt perseverative on receiving "water", even when told repeatedly he cannot. Pt with poor awareness of midline and safe sitting EOB, contraversive pushing noted towards L with little awareness of this. Pt follows most mobility commands, at times requires multimodal cuing to complete. Pt with periods of eye closing during session, opens eyes on command. L inattention, attends with visual and  verbal cuing.   General Comments  VSS on RA    Exercises     Shoulder Instructions      Home Living Family/patient expects to be discharged to:: Group home (Boarding house) Living Arrangements: Alone   Type of Home: House Home Access: Stairs to enter Technical brewer of Steps: 6-7         Bathroom Shower/Tub: Teacher, early years/pre: Standard         Additional Comments: Roommates  Lives With: Other (Comment) (roommate)    Prior Functioning/Environment Level of Independence: Independent        Comments: pt reports he likes to walk outdoors        OT Problem List: Decreased strength;Decreased range of motion;Decreased activity tolerance;Impaired balance (sitting and/or standing);Impaired vision/perception;Decreased coordination;Decreased cognition;Decreased safety awareness;Decreased knowledge of use of DME or AE;Decreased knowledge of precautions;Pain;Impaired UE functional use      OT Treatment/Interventions: Self-care/ADL training;Therapeutic exercise;Energy conservation;DME and/or AE instruction;Therapeutic activities;Patient/family education    OT Goals(Current goals can be found in the care plan section) Acute Rehab OT Goals Patient Stated Goal: get some water OT Goal Formulation: With patient Time For Goal Achievement: 03/08/21 Potential to Achieve Goals: Good  OT Frequency: Min 2X/week   Barriers to D/C:            Co-evaluation PT/OT/SLP Co-Evaluation/Treatment: Yes Reason for Co-Treatment: Necessary to address cognition/behavior during functional activity;For patient/therapist safety;To address functional/ADL transfers PT goals addressed during session: Mobility/safety with mobility;Strengthening/ROM;Balance OT goals addressed during session: ADL's and self-care      AM-PAC OT "6 Clicks" Daily Activity     Outcome Measure Help from another  person eating meals?: Total Help from another person taking care of personal grooming?:  A Lot Help from another person toileting, which includes using toliet, bedpan, or urinal?: Total Help from another person bathing (including washing, rinsing, drying)?: Total Help from another person to put on and taking off regular upper body clothing?: Total Help from another person to put on and taking off regular lower body clothing?: Total 6 Click Score: 7   End of Session Nurse Communication: Mobility status  Activity Tolerance: Patient tolerated treatment well Patient left: in bed;with call bell/phone within reach;with bed alarm set  OT Visit Diagnosis: Unsteadiness on feet (R26.81);Other abnormalities of gait and mobility (R26.89);Muscle weakness (generalized) (M62.81);Hemiplegia and hemiparesis Hemiplegia - Right/Left: Left Hemiplegia - dominant/non-dominant: Dominant Hemiplegia - caused by: Cerebral infarction                Time: 3533-1740 OT Time Calculation (min): 23 min Charges:  OT General Charges $OT Visit: 1 Visit OT Evaluation $OT Eval Moderate Complexity: [place], OTR/L Acute Rehab Pager: [phone] Office: [place] 02/22/2021, 4:30 PM

## 2021-02-23 DIAGNOSIS — Z720 Tobacco use: Secondary | ICD-10-CM

## 2021-02-23 DIAGNOSIS — I10 Essential (primary) hypertension: Secondary | ICD-10-CM

## 2021-02-23 DIAGNOSIS — E1165 Type 2 diabetes mellitus with hyperglycemia: Secondary | ICD-10-CM

## 2021-02-23 DIAGNOSIS — E871 Hypo-osmolality and hyponatremia: Secondary | ICD-10-CM

## 2021-02-23 DIAGNOSIS — I610 Nontraumatic intracerebral hemorrhage in hemisphere, subcortical: Principal | ICD-10-CM

## 2021-02-23 DIAGNOSIS — D72829 Elevated white blood cell count, unspecified: Secondary | ICD-10-CM

## 2021-02-23 LAB — GLUCOSE, CAPILLARY
Glucose-Capillary: 136 mg/dL — ABNORMAL HIGH (ref 70–99)
Glucose-Capillary: 140 mg/dL — ABNORMAL HIGH (ref 70–99)
Glucose-Capillary: 147 mg/dL — ABNORMAL HIGH (ref 70–99)
Glucose-Capillary: 156 mg/dL — ABNORMAL HIGH (ref 70–99)
Glucose-Capillary: 169 mg/dL — ABNORMAL HIGH (ref 70–99)
Glucose-Capillary: 171 mg/dL — ABNORMAL HIGH (ref 70–99)

## 2021-02-23 LAB — BASIC METABOLIC PANEL
Anion gap: 11 (ref 5–15)
BUN: 11 mg/dL (ref 6–20)
CO2: 21 mmol/L — ABNORMAL LOW (ref 22–32)
Calcium: 8.9 mg/dL (ref 8.9–10.3)
Chloride: 100 mmol/L (ref 98–111)
Creatinine, Ser: 1.12 mg/dL (ref 0.61–1.24)
GFR, Estimated: >60 mL/min (ref >60)
Glucose, Bld: 157 mg/dL — ABNORMAL HIGH (ref 70–99)
Potassium: 3.8 mmol/L (ref 3.5–5.1)
Sodium: 132 mmol/L — ABNORMAL LOW (ref 135–145)

## 2021-02-23 LAB — CBC
HCT: 45.6 % (ref 39.0–52.0)
Hemoglobin: 15.4 g/dL (ref 13.0–17.0)
MCH: 29.2 pg (ref 26.0–34.0)
MCHC: 33.8 g/dL (ref 30.0–36.0)
MCV: 86.5 fL (ref 80.0–100.0)
Platelets: 254 10*3/uL (ref 150–400)
RBC: 5.27 MIL/uL (ref 4.22–5.81)
RDW: 12.3 % (ref 11.5–15.5)
WBC: 13.2 10*3/uL — ABNORMAL HIGH (ref 4.0–10.5)
nRBC: 0 % (ref 0.0–0.2)

## 2021-02-23 LAB — TRIGLYCERIDES: Triglycerides: 380 mg/dL — ABNORMAL HIGH (ref <150)

## 2021-02-23 MED ORDER — LABETALOL HCL 5 MG/ML IV SOLN
20.0000 mg | INTRAVENOUS | Status: DC | PRN
  Administered 2021-02-23: 20 mg via INTRAVENOUS
  Administered 2021-02-23 (×2): 10 mg via INTRAVENOUS
  Administered 2021-02-24 – 2021-02-25 (×4): 20 mg via INTRAVENOUS
  Filled 2021-02-23 (×8): qty 4

## 2021-02-23 MED ORDER — HYDROCHLOROTHIAZIDE 25 MG PO TABS
25.0000 mg | ORAL_TABLET | Freq: Every day | ORAL | Status: DC
  Administered 2021-02-23 – 2021-02-25 (×3): 25 mg via ORAL
  Filled 2021-02-23 (×3): qty 1

## 2021-02-23 MED ORDER — ENOXAPARIN SODIUM 40 MG/0.4ML ~~LOC~~ SOLN
40.0000 mg | SUBCUTANEOUS | Status: DC
  Administered 2021-02-23 – 2021-03-03 (×9): 40 mg via SUBCUTANEOUS
  Filled 2021-02-23 (×11): qty 0.4

## 2021-02-23 MED ORDER — ATORVASTATIN CALCIUM 10 MG PO TABS
10.0000 mg | ORAL_TABLET | Freq: Every day | ORAL | Status: DC
  Administered 2021-02-23 – 2021-02-26 (×4): 10 mg via ORAL
  Filled 2021-02-23 (×4): qty 1

## 2021-02-23 NOTE — Progress Notes (Signed)
Inpatient Rehab Admissions Coordinator Note:   Per therapy recommendations, pt was screened for CIR candidacy by Shann Medal, PT, DPT.  At this time we are recommending a CIR consult and I will place an order per our protocol.  Please contact me with questions.   Shann Medal, PT, DPT [phone] 02/23/21 10:48 AM

## 2021-02-23 NOTE — Progress Notes (Addendum)
STROKE TEAM PROGRESS NOTE   INTERVAL HISTORY No acute events overnight.  He reports that he is doing well today. He is still unable to move his left leg but is able to move his left arm. He is more alert today. Discussed getting him to the floor tomorrow. Discussed MRI results showing slight increase in hemorrhage but is otherwise stable. When asked about the mass on his Left neck he reports that it has been there for about 1.5 yrs. Discussed following up with his PCP for surgical referral after 3-6 months after his stroke.  Vitals:   02/23/21 0630 02/23/21 0645 02/23/21 0700 02/23/21 0715  BP: 124/75 133/76 130/82 131/73  Pulse: 67 81 78 79  Resp: 17 20 18 17   Temp:      TempSrc:      SpO2: 96% 97% 95% 95%   CBC:  Recent Labs  Lab 02/21/21 1730 02/21/21 1737  WBC 6.5  --   NEUTROABS 4.3  --   HGB 15.7 16.3  HCT 49.3 48.0  MCV 91.0  --   PLT 315  --    Basic Metabolic Panel:  Recent Labs  Lab 02/21/21 1730 02/21/21 1737  NA 138 141  K 4.6 4.4  CL 106 105  CO2 24  --   GLUCOSE 118* 118*  BUN 12 16  CREATININE 1.17 1.10  CALCIUM 8.9  --    Lipid Panel: No results for input(s): CHOL, TRIG, HDL, CHOLHDL, VLDL, LDLCALC in the last 168 hours. HgbA1c:  Recent Labs  Lab 02/22/21 0536  HGBA1C 6.4*   Urine Drug Screen:  Recent Labs  Lab 02/22/21 0005  LABOPIA NONE DETECTED  COCAINSCRNUR POSITIVE*  LABBENZ NONE DETECTED  AMPHETMU NONE DETECTED  THCU NONE DETECTED  LABBARB NONE DETECTED    Alcohol Level  Recent Labs  Lab 02/21/21 1730  ETH <10    IMAGING past 24 hours MR BRAIN W WO CONTRAST  Result Date: 02/22/2021 CLINICAL DATA:  Brain mass or lesion; presenting with ICH, evaluate for an underlying lesion. EXAM: MRI HEAD WITHOUT AND WITH CONTRAST TECHNIQUE: Multiplanar, multiecho pulse sequences of the brain and surrounding structures were obtained without and with intravenous contrast. CONTRAST:  75mL GADAVIST GADOBUTROL 1 MMOL/ML IV SOLN COMPARISON:   Noncontrast head CT 02/22/2021. CT angiogram head/neck 02/21/2021. Noncontrast head CT 02/21/2021. FINDINGS: Brain: Intermittently motion degraded examination. Most notably, there is moderate/severe motion degradation of the coronal T1 weighted postcontrast sequence and moderate/severe motion degradation of the sagittal T1 weighted postcontrast sequence. Cerebral volume is normal. Redemonstrated acute parenchymal hemorrhage centered within the right basal ganglia. The parenchymal hemorrhage has slightly increased in size as compared to the head CT performed earlier today, now measuring 5.5 x 3.1 cm in transaxial dimensions (previously measuring 5.5 x 2.8 cm in transaxial dimensions). Unchanged surrounding edema. No appreciable masslike or nodular enhancement at the hemorrhage site on the current exam. Mass effect has increased with increased partial effacement of the right lateral and third ventricles. 3 mm leftward midline shift measured at the level of the septum lucent has also slightly increased. There is a subcentimeter cortical infarct along the posterior aspect of the hemorrhage within the right temporoparietal cortex (series 3, image 31). Additional subcentimeter focus of diffusion weighted hyperintensity along the high posterior right frontal lobe (series 3, image 49) (series 4, image 12). Moderate multifocal T2/FLAIR hyperintensity within the cerebral white matter is nonspecific, but compatible with chronic small vessel ischemic disease. 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 No extra-axial  fluid collection. No evidence of hydrocephalus. Vascular: Expected proximal arterial flow voids. Skull and upper cervical spine: No focal marrow lesion. Incompletely assessed upper cervical spondylosis. A C3-C4 posterior disc osteophyte contributes to at least mild spinal canal stenosis, contacting and mildly flattening the ventral spinal cord. Sinuses/Orbits: Visualized orbits show no acute finding. Trace  right frontal and bilateral ethmoid sinus mucosal thickening. Small and moderate-sized bilateral maxillary sinus mucous retention cysts. IMPRESSION: Significant intermittent motion degradation, as described and most notably affecting the T1 weighted postcontrast imaging. An acute parenchymal hemorrhage centered within the right basal ganglia has slightly increased in size since the head CT performed earlier today, now measuring 5.5 x 3.1 cm in transaxial dimensions. No evidence of an underlying mass on the current exam. However, contrast-enhanced MRI follow-up is recommended once the hematoma involutes for further evaluation. Edema surrounding the hemorrhage is similar. However, there is increased mass effect with increased partial effacement of the right lateral and third ventricles. 3 mm leftward midline shift measured at the level of the septum pellucidum has also slightly increased. Subcentimeter acute cortical infarct adjacent to the hemorrhage within the right temporoparietal cortex. Additional subcentimeter focus of apparent diffusion weighted hyperintensity along the posterior high right frontal lobe, which is favored to reflect artifact arising from the calvarium. However, an additional small acute infarct cannot be excluded. Moderate cerebral white matter chronic small vessel ischemic disease. 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 Paranasal sinus disease as described. Electronically Signed   By: Kellie Simmering DO   On: 02/22/2021 14:35   ECHOCARDIOGRAM COMPLETE  Result Date: 02/22/2021    ECHOCARDIOGRAM REPORT   Patient Name:   Chad Coleman Date of Exam: 02/22/2021 Medical Rec #:  [id number]        Height:       69.0 in Accession #:    [id number]       Weight:       163.0 lb Date of Birth:  January 26, 1961        BSA:          1.894 m Patient Age:    60 years         BP:           149/89 mmHg Patient Gender: M                HR:           76 bpm. Exam Location:  Inpatient Procedure: 2D Echo,  Cardiac Doppler and Color Doppler Indications:    CVA  History:        Patient has no prior history of Echocardiogram examinations.                 Risk Factors:Hypertension and Current Smoker. Polysubstance                 abuse, noncompliance.  Sonographer:    Dustin Flock Referring Phys: [id number]  1. Left ventricular ejection fraction, by estimation, is 65 to 70%. The left ventricle has normal function. The left ventricle has no regional wall motion abnormalities. There is moderate concentric left ventricular hypertrophy. Left ventricular diastolic parameters are consistent with Grade I diastolic dysfunction (impaired relaxation).  2. Right ventricular systolic function is normal. The right ventricular size is normal. There is normal pulmonary artery systolic pressure.  3. Left atrial size was moderately dilated.  4. Right atrial size was severely dilated.  5. The mitral valve is normal in structure. Trivial mitral valve regurgitation.  No evidence of mitral stenosis.  6. The aortic valve is tricuspid. Aortic valve regurgitation is not visualized. No aortic stenosis is present.  7. The inferior vena cava is normal in size with greater than 50% respiratory variability, suggesting right atrial pressure of 3 mmHg. FINDINGS  Left Ventricle: Left ventricular ejection fraction, by estimation, is 65 to 70%. The left ventricle has normal function. The left ventricle has no regional wall motion abnormalities. The left ventricular internal cavity size was normal in size. There is  moderate concentric left ventricular hypertrophy. Left ventricular diastolic parameters are consistent with Grade I diastolic dysfunction (impaired relaxation). Normal left ventricular filling pressure. Right Ventricle: The right ventricular size is normal. No increase in right ventricular wall thickness. Right ventricular systolic function is normal. There is normal pulmonary artery systolic pressure. The  tricuspid regurgitant velocity is 2.82 m/s, and  with an assumed right atrial pressure of 3 mmHg, the estimated right ventricular systolic pressure is 76.1 mmHg. Left Atrium: Left atrial size was moderately dilated. Right Atrium: Right atrial size was severely dilated. Pericardium: There is no evidence of pericardial effusion. Mitral Valve: The mitral valve is normal in structure. Trivial mitral valve regurgitation. No evidence of mitral valve stenosis. Tricuspid Valve: The tricuspid valve is normal in structure. Tricuspid valve regurgitation is mild . No evidence of tricuspid stenosis. Aortic Valve: The aortic valve is tricuspid. Aortic valve regurgitation is not visualized. No aortic stenosis is present. Pulmonic Valve: The pulmonic valve was normal in structure. Pulmonic valve regurgitation is not visualized. No evidence of pulmonic stenosis. Aorta: The aortic root is normal in size and structure. Venous: The inferior vena cava is normal in size with greater than 50% respiratory variability, suggesting right atrial pressure of 3 mmHg. IAS/Shunts: No atrial level shunt detected by color flow Doppler.  LEFT VENTRICLE PLAX 2D LVIDd:         4.80 cm  Diastology LVIDs:         3.70 cm  LV e' medial:    5.77 cm/s LV PW:         1.60 cm  LV E/e' medial:  8.4 LV IVS:        1.60 cm  LV e' lateral:   8.81 cm/s LVOT diam:     2.50 cm  LV E/e' lateral: 5.5 LV SV:         105 LV SV Index:   55 LVOT Area:     4.91 cm  RIGHT VENTRICLE RV Basal diam:  3.00 cm RV S prime:     28.30 cm/s TAPSE (M-mode): 3.1 cm LEFT ATRIUM             Index       RIGHT ATRIUM           Index LA diam:        2.90 cm 1.53 cm/m  RA Area:     23.30 cm LA Vol (A2C):   74.5 ml 39.34 ml/m RA Volume:   83.00 ml  43.82 ml/m LA Vol (A4C):   46.2 ml 24.39 ml/m LA Biplane Vol: 59.7 ml 31.52 ml/m  AORTIC VALVE LVOT Vmax:   125.00 cm/s LVOT Vmean:  76.200 cm/s LVOT VTI:    0.213 m  AORTA Ao Root diam: 3.40 cm MITRAL VALVE               TRICUSPID VALVE MV  Area (PHT): 2.34 cm    TR Peak grad:   31.8 mmHg MV Decel Time:  324 msec    TR Vmax:        282.00 cm/s MV E velocity: 48.40 cm/s MV A velocity: 63.80 cm/s  SHUNTS MV E/A ratio:  0.76        Systemic VTI:  0.21 m                            Systemic Diam: 2.50 cm Skeet Latch MD Electronically signed by Skeet Latch MD Signature Date/Time: 02/22/2021/2:41:44 PM    Final     PHYSICAL EXAM Blood pressure 131/73, pulse 79, temperature 99.6 F (37.6 C), temperature source Oral, resp. rate 17, SpO2 95 %.  General: alert and awake, african [place] male, no apparent distress  Lungs: Symmetrical Chest rise, no labored breathing  Cardio: Irregular Rhythm  Abdomen: Soft, non-tender  Neuro: Alert, oriented, thought content appropriate. Speech fluent without evidence of aphasia.  Able to follow all commands without difficulty. Cranial Nerves: II:  Visual fields grossly normal, pupils equal, round, reactive to light and accommodation III,IV, VI: ptosis not present, extra-ocular motions intact bilaterally V,VII: left sided facial droop, facial light touch sensation normal bilaterally VIII: hearing normal bilaterally IX,X: uvula rises symmetrically XI: bilateral shoulder shrug XII: midline tongue extension without atrophy or fasciculations  Motor: Right : Upper extremity   5/5    Left:     Upper extremity   2/5  Lower extremity   5/5     Lower extremity   3/5 Tone and bulk:normal tone throughout; no atrophy noted Reflexes present on Right but absent on Left Right plantar down going Left equivocal Sensory: Reports no sensation on his Left side Gait: deferred    ASSESSMENT/PLAN Chad Coleman is a 59 y.o. male with history of hypertension, tobacco use, noncompliance with antihypertensive medications, and cocaine abuse presenting with Left sided weakness and left facial droop. Brought to the ED via EMS as a Code Stroke for evaluation of acute onset of left-sided weakness, numbness,  and left facial droop. Last seen normal by his roommate at 11:30 this morning but the patient states that his weakness onset was approximately 45-minutes prior to hospital arrival (arrivaed at 17:27). Of note was a recent ER visit where he was found to be hypertensive and reported that he had been out of his hydrochlorothiazide and felt that it did not help with his hypertension but just caused polyuria.   He is more alert today. Will wean off Cleviprex and restart his HCTZ. Will hopefully be able to move him to the floor tomorrow. Current recommendation is CIR. 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 Recommended he follow up with his PCP in 6 months for Surgical referral.   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  Code Stroke CT head - Approximately 5.2 cm acute intraparenchymal hemorrhage in the lateral right basal ganglia, as detailed above. Mass effect results in effacement of the right lateral ventricle and slight (2 mm) of leftward midline shift.  CTA head - No contrast blush in the region of the acute right basal ganglia hemorrhage (spot sign). No appreciable intracranial aneurysm or AVM. No intracranial large vessel occlusion or proximal high-grade arterial stenosis.  CTA neck - The common carotid, internal carotid and vertebral arteries are patent within the neck without stenosis. 7.4 x 7.4 x 8.5 cm lobulated low-attenuation  lesion within the mid to lower left neck. 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 Retained metallic fragments within the right neck soft tissues.  CT Head 3/29 - Stable right basal ganglia intraparenchymal hematoma and  associated mild mass effect. No hydrocephalus. No interval hemorrhage.  MRI - 5.5 x 3.1 cm right basal ganglia hemorrhage with mild surrounding edema and 3 mm leftward midline shift. This is slightly increased compared with previous CT scan. Tiny sub centimeter infarcts adjacent to the hemorrhage in the right temporoparietal cortex and posterior high right frontal lobe.  2D Echo - EF: 65-70%. No wall motion abnormality  LDL 52  HgbA1c 6.4  VTE prophylaxis - Lovenox         Diet     DIET DYS 2 Room service appropriate? No; Fluid consistency: Thin         No antithrombotic prior to admission, now on No antithrombotic for now given intracranial hemorrhage.   Therapy recommendations:  CIR  Disposition:  Pending  Hypertension  Home meds:  Amlodipine, HCTZ  Stable  Currently on Cleviprex will attempt to wean  Currently on Amlodipine 5 mg daily  Restart HCTZ 25 mg daily  Currently on Labetalol 20 mg q2 PRN Systolic >354  Currently on Hydralazine 20 mg q6 PRN Systolic>160   Permissive hypertension (OK if < 220/120) but gradually normalize in 5-7 days  Long-term BP goal normotensive  Hyperlipidemia  Home meds:  none  LDL 52, goal < 70  High intensity statin Atorvastatin 10 mg daily  Continue statin at discharge  Pre-Diabetes  Home meds: None  HgbA1c 6.4, goal < 7.0  CBGs Recent Labs (last 2 labs)         Recent Labs      02/22/21  0339  02/22/21  0747  02/22/21  1140   GLUCAP  140*  127*  179*    ?    SSI  Other Stroke Risk Factors  Cigarette smoker - advised to stop smoking  Substance abuse - UDS:  THC NONE DETECTED, Cocaine POSITIVE. Patient advised to stop using due to stroke risk.   Hospital day # 2  Fatima Sanger MD Resident I have personally obtained history,examined this patient, reviewed notes, independently viewed imaging studies, participated in medical decision making and plan of care.ROS completed by me  personally and pertinent positives fully documented  I have made any additions or clarifications directly to the above note. Agree with note above.  Patient continues to show gradual neurological recovery and is more alert and interactive today though he still has significant left hemiparesis.  Is still on Codeprex drip for blood pressure control but plan to wean drip today and use as needed IV labetalol and hydralazine and increase oral blood pressure medication and add hydrochlorothiazide to Norvasc.  Mobilize out of bed.  Therapy consults.  Hopefully transfer to the floor tomorrow and then to inpatient rehab in a few days.  No family available at the bedside today.This patient is critically ill and at significant risk of neurological worsening, death and care requires constant monitoring of vital signs, hemodynamics,respiratory and cardiac monitoring, extensive review of multiple databases, frequent neurological assessment, discussion with family, other specialists and medical decision making of high complexity.I have made any additions or clarifications directly to the above note.This critical care time does not reflect procedure time, or  teaching time or supervisory time of PA/NP/Med Resident etc but could involve care discussion time.  I spent 30 minutes of neurocritical care time  in the care of  this patient.      Antony Contras, MD Medical Director [facility] [place] Pager: [phone] 02/23/2021 3:49 PM   To contact Stroke Continuity provider, please refer to [email or web address] After hours, contact General Neurology

## 2021-02-23 NOTE — Consult Note (Signed)
Physical Medicine and Rehabilitation Consult Reason for Consult: Left side weakness with facial droop Referring Physician: Dr. Leonie Man  HPI: Chad Coleman is a 60 y.o. right-handed male with history of hypertension as well as tobacco/polysubstance use.  History taken from chart review and patient.  Patient lives alone.  Independent prior to admission in a boarding home.  He presented on 02/21/2021 with left hemiparesis and facial droop.  Cranial CT scan showed approximately 5.2cm acute intraparenchymal hemorrhage in the lateral right basal ganglia.  Mass-effect resulting in effacement of right lateral ventricle and slight 2 mm leftward midline shift.  CT angiogram of head and neck showed common carotid, internal carotid and vertebral arteries patent within the neck mild stenosis.  There was a 7.4 x 7.4 x 8.5 cm lobulated low-attenuation lesion within the mid to lower left neck.  Findings suggestive of lymphovascular malformation. MRI follow-up again noted acute parenchymal hemorrhage right basal ganglia slightly increased in size since head CT performed now measuring 5.5 x 3.1 cm in transaxial dimensions.  Echocardiogram with ejection fraction of 65--70%, no wall motion abnormalities grade 1 diastolic dysfunction.  Admission chemistries unremarkable except glucose 118, hemoglobin 13.7, alcohol negative.  Placed on Cleviprex for blood pressure control.  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  He was cleared to begin Lovenox for DVT prophylaxis 02/23/2021.  Therapy evaluations completed due to patient's left side hemiparesis recommendations of physical medicine rehab consult.  Please see preadmission assessment from earlier as well.  Review of Systems  Constitutional: Negative for chills and fever.  HENT: Negative for hearing loss.   Eyes: Negative for blurred vision and double vision.  Respiratory: Negative for cough and shortness  of breath.   Cardiovascular: Negative for chest pain, palpitations and leg swelling.  Gastrointestinal: Positive for constipation. Negative for heartburn and nausea.  Genitourinary: Negative for dysuria and hematuria.  Musculoskeletal: Positive for myalgias.  Skin: Negative for rash.  Neurological: Positive for dizziness, speech change, focal weakness, weakness and headaches.  All other systems reviewed and are negative.  Past Medical History:  Diagnosis Date  . Hypertension    No past surgical history. No pertinent family history of premature CVA. Social History:  reports that he has been smoking. He has never used smokeless tobacco. No history on file for alcohol use and drug use. Allergies: No Known Allergies Medications Prior to Admission  Medication Sig Dispense Refill  . amLODipine (NORVASC) 5 MG tablet Take 1 tablet (5 mg total) by mouth daily. 30 tablet 1  . hydrochlorothiazide (HYDRODIURIL) 25 MG tablet Take 1 tablet (25 mg total) by mouth daily. (Patient not taking: No sig reported) 30 tablet 1    Home: [place] expects to be discharged to:: Group home (Boarding house) Living Arrangements: Alone Type of Home: House Home Access: Stairs to enter Technical brewer of Steps: 6-7 Bathroom Shower/Tub: Chiropodist: Standard Additional Comments: Roommates  Lives With: Other (Comment) (roommate)  Functional History: Prior Function Level of Independence: Independent Comments: pt reports he likes to walk outdoors Functional Status:  Mobility: Bed Mobility Overal bed mobility: Needs Assistance Bed Mobility: Supine to Sit,Sit to Supine Supine to sit: Mod assist,HOB elevated,+2 for physical assistance Sit to supine: Mod assist,HOB elevated,+2 for physical assistance General bed mobility comments: mod assist +2 for trunk elevation off of bed, righting posture, and lifting LLE into and out of bed. Verbal cuing for sequencing task,  using bedrails.  Increased time. Transfers Overall transfer level: Needs assistance Equipment used: 2 person hand held assist Transfers: Sit to/from Stand Sit to Stand: Mod assist,+2 physical assistance,+2 safety/equipment,From elevated surface General transfer comment: mod +2 for power up, rise, and steadying. Weight shifting L and R with L knee blocking, cues for upright posture multiple times and weight shifting. Ambulation/Gait General Gait Details: NT today, pt fatiguing and reporting increased R shoulder pain in standing    ADL: ADL Overall ADL's : Needs assistance/impaired Eating/Feeding: NPO Grooming: Maximal assistance,Minimal assistance,Sitting Grooming Details (indicate cue type and reason): Max A for sitting balance. Min A for initating to bring wash cloth to face General ADL Comments: Total A for bathing, dressing, and bed mobility.  Cognition: Cognition Overall Cognitive Status: Impaired/Different from baseline Arousal/Alertness: Awake/alert Orientation Level: Oriented X4 Attention: Sustained,Selective Sustained Attention: Appears intact Selective Attention: Impaired Selective Attention Impairment: Verbal basic Memory: Appears intact Awareness: Impaired Awareness Impairment: Emergent impairment Problem Solving: Impaired Problem Solving Impairment: Functional basic,Verbal basic Behaviors: Impulsive Safety/Judgment: Impaired Cognition Arousal/Alertness: Lethargic (periods of drowsiness, followed by periods of wakefulness) Behavior During Therapy: Flat affect Overall Cognitive Status: Impaired/Different from baseline Area of Impairment: Memory,Attention,Following commands,Safety/judgement,Problem solving Current Attention Level: Sustained Memory: Decreased short-term memory Following Commands: Follows one step commands with increased time,Follows one step commands inconsistently Safety/Judgement: Decreased awareness of safety,Decreased awareness of  deficits Problem Solving: Slow processing,Decreased initiation,Requires verbal cues,Requires tactile cues,Difficulty sequencing General Comments: pt A&Ox4, states "I had a stroke". Pt perseverative on receiving "water", even when told repeatedly he cannot. Pt with poor awareness of midline and safe sitting EOB, contraversive pushing noted towards L with little awareness of this. Pt follows most mobility commands, at times requires multimodal cuing to complete. Pt with periods of eye closing during session, opens eyes on command. L inattention, attends with visual and verbal cuing.  Blood pressure (!) 108/91, pulse 79, temperature 98.7 F (37.1 C), temperature source Oral, resp. rate 19, SpO2 96 %. Physical Exam Vitals reviewed.  Constitutional:      General: He is not in acute distress.    Appearance: He is not ill-appearing.  HENT:     Head: Normocephalic and atraumatic.     Right Ear: External ear normal.     Left Ear: External ear normal.     Nose: Nose normal.  Eyes:     General:        Right eye: No discharge.        Left eye: No discharge.     Extraocular Movements: Extraocular movements intact.  Neck:     Comments: Left neck mass Cardiovascular:     Rate and Rhythm: Normal rate and regular rhythm.  Pulmonary:     Effort: Pulmonary effort is normal. No respiratory distress.     Breath sounds: No stridor.  Abdominal:     General: Abdomen is flat. Bowel sounds are normal. There is no distension.  Musculoskeletal:     Cervical back: Normal range of motion.     Comments: No edema or tenderness in extremities  Skin:    General: Skin is warm and dry.  Neurological:     Mental Status: He is alert.     Comments: Alert Makes eye contact with examiner.   Provides name and age.   Follows simple commands. Left neglect Motor: LUE: 0/5 proximal distal LLE: 2+/5 proximal and distal RUE/RLE: 5/5 proximal distal Left facial weakness Increased tone in left upper extremity   Psychiatric:        Speech:  Speech is slurred.        Behavior: Behavior is slowed.     Results for orders placed or performed during the hospital encounter of 02/21/21 (from the past 24 hour(s))  Glucose, capillary     Status: Abnormal   Collection Time: 02/22/21 11:40 AM  Result Value Ref Range   Glucose-Capillary 179 (H) 70 - 99 mg/dL  Glucose, capillary     Status: Abnormal   Collection Time: 02/22/21  4:02 PM  Result Value Ref Range   Glucose-Capillary 183 (H) 70 - 99 mg/dL  Glucose, capillary     Status: Abnormal   Collection Time: 02/22/21  7:49 PM  Result Value Ref Range   Glucose-Capillary 161 (H) 70 - 99 mg/dL  Glucose, capillary     Status: Abnormal   Collection Time: 02/22/21 11:35 PM  Result Value Ref Range   Glucose-Capillary 144 (H) 70 - 99 mg/dL  Glucose, capillary     Status: Abnormal   Collection Time: 02/23/21  3:47 AM  Result Value Ref Range   Glucose-Capillary 169 (H) 70 - 99 mg/dL  Glucose, capillary     Status: Abnormal   Collection Time: 02/23/21  8:22 AM  Result Value Ref Range   Glucose-Capillary 171 (H) 70 - 99 mg/dL  CBC     Status: Abnormal   Collection Time: 02/23/21  8:58 AM  Result Value Ref Range   WBC 13.2 (H) 4.0 - 10.5 K/uL   RBC 5.27 4.22 - 5.81 MIL/uL   Hemoglobin 15.4 13.0 - 17.0 g/dL   HCT 45.6 39.0 - 52.0 %   MCV 86.5 80.0 - 100.0 fL   MCH 29.2 26.0 - 34.0 pg   MCHC 33.8 30.0 - 36.0 g/dL   RDW 12.3 11.5 - 15.5 %   Platelets 254 150 - 400 K/uL   nRBC 0.0 0.0 - 0.2 %   CT HEAD WO CONTRAST  Result Date: 02/22/2021 CLINICAL DATA:  Altered mental status, intracranial hemorrhage EXAM: CT HEAD WITHOUT CONTRAST TECHNIQUE: Contiguous axial images were obtained from the base of the skull through the vertex without intravenous contrast. COMPARISON:  02/21/2021 FINDINGS: Brain: Intraparenchymal hematoma within the right basal ganglia is stable measuring 28 x 55 x 40 mm at axial image # 15/3 and coronal image # 29/5. Mild surrounding  cytotoxic edema and mass effect with effacement of the right lateral ventricle and sylvian fissure appears stable. 1-2 mm right to left midline shift appears stable. No interval hemorrhage. Ventricular size is stable. Cerebellum is unremarkable. Vascular: No asymmetric hyperdense vasculature at the skull base. Skull: The calvarium is intact. Sinuses/Orbits: Mucous retention cysts are seen within the maxillary sinuses bilaterally, incompletely visualized. The remaining paranasal sinuses are clear. Orbits are unremarkable. Other: Mastoid air cells and middle ear cavities are clear. IMPRESSION: Stable right basal ganglia intraparenchymal hematoma and associated mild mass effect. No hydrocephalus. No interval hemorrhage. Electronically Signed   By: Fidela Salisbury MD   On: 02/22/2021 01:32   MR BRAIN W WO CONTRAST  Result Date: 02/22/2021 CLINICAL DATA:  Brain mass or lesion; presenting with ICH, evaluate for an underlying lesion. EXAM: MRI HEAD WITHOUT AND WITH CONTRAST TECHNIQUE: Multiplanar, multiecho pulse sequences of the brain and surrounding structures were obtained without and with intravenous contrast. CONTRAST:  58mL GADAVIST GADOBUTROL 1 MMOL/ML IV SOLN COMPARISON:  Noncontrast head CT 02/22/2021. CT angiogram head/neck 02/21/2021. Noncontrast head CT 02/21/2021. FINDINGS: Brain: Intermittently motion degraded examination. Most notably, there is moderate/severe motion degradation of the coronal T1  weighted postcontrast sequence and moderate/severe motion degradation of the sagittal T1 weighted postcontrast sequence. Cerebral volume is normal. Redemonstrated acute parenchymal hemorrhage centered within the right basal ganglia. The parenchymal hemorrhage has slightly increased in size as compared to the head CT performed earlier today, now measuring 5.5 x 3.1 cm in transaxial dimensions (previously measuring 5.5 x 2.8 cm in transaxial dimensions). Unchanged surrounding edema. No appreciable masslike or  nodular enhancement at the hemorrhage site on the current exam. Mass effect has increased with increased partial effacement of the right lateral and third ventricles. 3 mm leftward midline shift measured at the level of the septum lucent has also slightly increased. There is a subcentimeter cortical infarct along the posterior aspect of the hemorrhage within the right temporoparietal cortex (series 3, image 31). Additional subcentimeter focus of diffusion weighted hyperintensity along the high posterior right frontal lobe (series 3, image 49) (series 4, image 12). Moderate multifocal T2/FLAIR hyperintensity within the cerebral white matter is nonspecific, but compatible with chronic small vessel ischemic disease. 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 No extra-axial fluid collection. No evidence of hydrocephalus. Vascular: Expected proximal arterial flow voids. Skull and upper cervical spine: No focal marrow lesion. Incompletely assessed upper cervical spondylosis. A C3-C4 posterior disc osteophyte contributes to at least mild spinal canal stenosis, contacting and mildly flattening the ventral spinal cord. Sinuses/Orbits: Visualized orbits show no acute finding. Trace right frontal and bilateral ethmoid sinus mucosal thickening. Small and moderate-sized bilateral maxillary sinus mucous retention cysts. IMPRESSION: Significant intermittent motion degradation, as described and most notably affecting the T1 weighted postcontrast imaging. An acute parenchymal hemorrhage centered within the right basal ganglia has slightly increased in size since the head CT performed earlier today, now measuring 5.5 x 3.1 cm in transaxial dimensions. No evidence of an underlying mass on the current exam. However, contrast-enhanced MRI follow-up is recommended once the hematoma involutes for further evaluation. Edema surrounding the hemorrhage is similar. However, there is increased mass effect with increased partial effacement  of the right lateral and third ventricles. 3 mm leftward midline shift measured at the level of the septum pellucidum has also slightly increased. Subcentimeter acute cortical infarct adjacent to the hemorrhage within the right temporoparietal cortex. Additional subcentimeter focus of apparent diffusion weighted hyperintensity along the posterior high right frontal lobe, which is favored to reflect artifact arising from the calvarium. However, an additional small acute infarct cannot be excluded. Moderate cerebral white matter chronic small vessel ischemic disease. 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 Paranasal sinus disease as described. Electronically Signed   By: Kellie Simmering DO   On: 02/22/2021 14:35   ECHOCARDIOGRAM COMPLETE  Result Date: 02/22/2021    ECHOCARDIOGRAM REPORT   Patient Name:   Ainsley Marchi Date of Exam: 02/22/2021 Medical Rec #:  [id number]        Height:       69.0 in Accession #:    [id number]       Weight:       163.0 lb Date of Birth:  [birth year]        BSA:          1.894 m Patient Age:    21 years         BP:           149/89 mmHg Patient Gender: M                HR:           76 bpm.  Exam Location:  Inpatient Procedure: 2D Echo, Cardiac Doppler and Color Doppler Indications:    CVA  History:        Patient has no prior history of Echocardiogram examinations.                 Risk Factors:Hypertension and Current Smoker. Polysubstance                 abuse, noncompliance.  Sonographer:    Dustin Flock Referring Phys: [id number] Spry  1. Left ventricular ejection fraction, by estimation, is 65 to 70%. The left ventricle has normal function. The left ventricle has no regional wall motion abnormalities. There is moderate concentric left ventricular hypertrophy. Left ventricular diastolic parameters are consistent with Grade I diastolic dysfunction (impaired relaxation).  2. Right ventricular systolic function is normal. The right ventricular size is  normal. There is normal pulmonary artery systolic pressure.  3. Left atrial size was moderately dilated.  4. Right atrial size was severely dilated.  5. The mitral valve is normal in structure. Trivial mitral valve regurgitation. No evidence of mitral stenosis.  6. The aortic valve is tricuspid. Aortic valve regurgitation is not visualized. No aortic stenosis is present.  7. The inferior vena cava is normal in size with greater than 50% respiratory variability, suggesting right atrial pressure of 3 mmHg. FINDINGS  Left Ventricle: Left ventricular ejection fraction, by estimation, is 65 to 70%. The left ventricle has normal function. The left ventricle has no regional wall motion abnormalities. The left ventricular internal cavity size was normal in size. There is  moderate concentric left ventricular hypertrophy. Left ventricular diastolic parameters are consistent with Grade I diastolic dysfunction (impaired relaxation). Normal left ventricular filling pressure. Right Ventricle: The right ventricular size is normal. No increase in right ventricular wall thickness. Right ventricular systolic function is normal. There is normal pulmonary artery systolic pressure. The tricuspid regurgitant velocity is 2.82 m/s, and  with an assumed right atrial pressure of 3 mmHg, the estimated right ventricular systolic pressure is 14.4 mmHg. Left Atrium: Left atrial size was moderately dilated. Right Atrium: Right atrial size was severely dilated. Pericardium: There is no evidence of pericardial effusion. Mitral Valve: The mitral valve is normal in structure. Trivial mitral valve regurgitation. No evidence of mitral valve stenosis. Tricuspid Valve: The tricuspid valve is normal in structure. Tricuspid valve regurgitation is mild . No evidence of tricuspid stenosis. Aortic Valve: The aortic valve is tricuspid. Aortic valve regurgitation is not visualized. No aortic stenosis is present. Pulmonic Valve: The pulmonic valve was normal in  structure. Pulmonic valve regurgitation is not visualized. No evidence of pulmonic stenosis. Aorta: The aortic root is normal in size and structure. Venous: The inferior vena cava is normal in size with greater than 50% respiratory variability, suggesting right atrial pressure of 3 mmHg. IAS/Shunts: No atrial level shunt detected by color flow Doppler.  LEFT VENTRICLE PLAX 2D LVIDd:         4.80 cm  Diastology LVIDs:         3.70 cm  LV e' medial:    5.77 cm/s LV PW:         1.60 cm  LV E/e' medial:  8.4 LV IVS:        1.60 cm  LV e' lateral:   8.81 cm/s LVOT diam:     2.50 cm  LV E/e' lateral: 5.5 LV SV:         105 LV SV Index:   55  LVOT Area:     4.91 cm  RIGHT VENTRICLE RV Basal diam:  3.00 cm RV S prime:     28.30 cm/s TAPSE (M-mode): 3.1 cm LEFT ATRIUM             Index       RIGHT ATRIUM           Index LA diam:        2.90 cm 1.53 cm/m  RA Area:     23.30 cm LA Vol (A2C):   74.5 ml 39.34 ml/m RA Volume:   83.00 ml  43.82 ml/m LA Vol (A4C):   46.2 ml 24.39 ml/m LA Biplane Vol: 59.7 ml 31.52 ml/m  AORTIC VALVE LVOT Vmax:   125.00 cm/s LVOT Vmean:  76.200 cm/s LVOT VTI:    0.213 m  AORTA Ao Root diam: 3.40 cm MITRAL VALVE               TRICUSPID VALVE MV Area (PHT): 2.34 cm    TR Peak grad:   31.8 mmHg MV Decel Time: 324 msec    TR Vmax:        282.00 cm/s MV E velocity: 48.40 cm/s MV A velocity: 63.80 cm/s  SHUNTS MV E/A ratio:  0.76        Systemic VTI:  0.21 m                            Systemic Diam: 2.50 cm Skeet Latch MD Electronically signed by Skeet Latch MD Signature Date/Time: 02/22/2021/2:41:44 PM    Final    CT HEAD CODE STROKE WO CONTRAST  Result Date: 02/21/2021 CLINICAL DATA:  Code stroke. EXAM: CT HEAD WITHOUT CONTRAST TECHNIQUE: Contiguous axial images were obtained from the base of the skull through the vertex without intravenous contrast. COMPARISON:  None. FINDINGS: Brain: Acute intraparenchymal hemorrhage centered in the lateral aspect of the right basal ganglia,  measuring up to 5.2 by 2.8 by 4.0 cm (AP by transverse by craniocaudal) with estimated volume of approximately 29 mL. Mild surrounding edema. Associated mass effect with partial effacement of the right lateral ventricle and trace (2 mm) of leftward midline shift at the foramina [place]. No evidence of intraventricular extension. No hydrocephalus. No specific evidence of acute large vascular territory infarct. Vascular: No hyperdense vessel identified. Skull: No acute fracture. Sinuses/Orbits: Polyps versus retention cyst in bilateral maxillary sinuses. No acute orbital findings. Other: No mastoid effusions. IMPRESSION: Approximately 5.2 cm acute intraparenchymal hemorrhage in the lateral right basal ganglia, as detailed above. Mass effect results in effacement of the right lateral ventricle and slight (2 mm) of leftward midline shift. Code stroke imaging results were communicated on 02/21/2021 at 5:42 pm to provider Dr. Annice Pih via secure text paging. Also discussed with Dr. Milas Gain at 5:46 p.m. via telephone. Electronically Signed   By: Margaretha Sheffield MD   On: 02/21/2021 17:51   CT ANGIO HEAD CODE STROKE  Addendum Date: 02/21/2021   ADDENDUM REPORT: 02/21/2021 18:37 ADDENDUM: These results were called by telephone at the time of interpretation on 02/21/2021 at 6:35 pm to provider [facility] , who verbally acknowledged these results. Additionally, given the presence of retained metallic fragments within the right neck, a contrast-enhanced neck CT (rather than an MRI) should be performed for further evaluation of the described left neck mass. This was also discussed with Dr. Donnetta Simpers at 6:35 p.m. on 02/21/2021. Electronically Signed   By: Marylyn Ishihara  Golden DO   On: 02/21/2021 18:37   Result Date: 02/21/2021 CLINICAL DATA:  Stroke/TIA, assess extracranial arteries. Stroke/TIA, assess intracranial arteries. EXAM: CT ANGIOGRAPHY HEAD AND NECK TECHNIQUE: Multidetector CT imaging of the head and neck  was performed using the standard protocol during bolus administration of intravenous contrast. Multiplanar CT image reconstructions and MIPs were obtained to evaluate the vascular anatomy. Carotid stenosis measurements (when applicable) are obtained utilizing NASCET criteria, using the distal internal carotid diameter as the denominator. CONTRAST:  51mL OMNIPAQUE IOHEXOL 350 MG/ML SOLN COMPARISON:  Noncontrast head CT performed earlier today 02/21/2021. FINDINGS: CTA NECK FINDINGS Aortic arch: Standard aortic branching. The visualized aortic arch is normal in caliber. No hemodynamically significant innominate or proximal subclavian artery stenosis. Right carotid system: CCA and ICA patent within the neck without stenosis. No significant atherosclerotic disease. Left carotid system: CCA and ICA patent within the neck without stenosis. No significant atherosclerotic disease. Vertebral arteries: Vertebral arteries patent within the neck without stenosis. The right vertebral artery is dominant. Skeleton: Cervical dextrocurvature reversal of the expected cervical lordosis. Trace C2-C3 grade 1 anterolisthesis. Cervical spondylosis with multilevel disc space narrowing, disc bulges/disc protrusions, uncovertebral hypertrophy and facet arthrosis. Other neck: Large lobular low-attenuation lesion within the mid to lower left neck, posterior to the left sternocleidomastoid muscle, measuring 7.4 x 7.4 x 8.5 cm (AP x TV x CC) (for instance as seen on series 6, image 104) (series 9, image 155). Additionally, there are retained metallic foreign bodies within the right neck. Upper chest: No consolidation within the imaged lung apices. Review of the MIP images confirms the above findings CTA HEAD FINDINGS Anterior circulation: The intracranial internal carotid arteries are patent. The M1 middle cerebral arteries are patent. No M2 proximal branch occlusion or high-grade proximal stenosis is identified. The anterior cerebral arteries  are patent. Hypoplastic A1 left anterior cerebral artery. No intracranial aneurysm or AVM is identified. Specifically, no abnormal enhancement is appreciated in the region of the acute right basal ganglia hemorrhage. Posterior circulation: The intracranial vertebral arteries are patent. The basilar artery is patent. The posterior cerebral arteries are patent. Posterior communicating arteries are hypoplastic or at bilaterally. Venous sinuses: Within the limitations of contrast timing, no convincing thrombus. Anatomic variants: As described Review of the MIP images confirms the above findings IMPRESSION: CTA neck: 1. The common carotid, internal carotid and vertebral arteries are patent within the neck without stenosis. 2. 7.4 x 7.4 x 8.5 cm lobulated low-attenuation lesion within the mid to lower left neck. 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 3. Retained metallic fragments within the right neck soft tissues. CTA head: 1. No contrast blush in the region of the acute right basal ganglia hemorrhage (spot sign). 2. No appreciable intracranial aneurysm or AVM. 3. No intracranial large vessel occlusion or proximal high-grade arterial stenosis. Electronically Signed: By: Kellie Simmering DO On: 02/21/2021 18:31   CT ANGIO NECK CODE STROKE  Addendum Date: 02/21/2021   ADDENDUM REPORT: 02/21/2021 18:37 ADDENDUM: These results were called by telephone at the time of interpretation on 02/21/2021 at 6:35 pm to provider [facility] , who verbally acknowledged these results. Additionally, given the presence of retained metallic fragments within the right neck, a contrast-enhanced neck CT (rather than an MRI) should be performed for further evaluation of the described left neck mass. This was also discussed with Dr.  SALMAN KHALIQDINA at 6:35  p.m. on 02/21/2021. Electronically Signed   By: Kellie Simmering DO   On: 02/21/2021 18:37   Result Date: 02/21/2021 CLINICAL DATA:  Stroke/TIA, assess extracranial arteries. Stroke/TIA, assess intracranial arteries. EXAM: CT ANGIOGRAPHY HEAD AND NECK TECHNIQUE: Multidetector CT imaging of the head and neck was performed using the standard protocol during bolus administration of intravenous contrast. Multiplanar CT image reconstructions and MIPs were obtained to evaluate the vascular anatomy. Carotid stenosis measurements (when applicable) are obtained utilizing NASCET criteria, using the distal internal carotid diameter as the denominator. CONTRAST:  55mL OMNIPAQUE IOHEXOL 350 MG/ML SOLN COMPARISON:  Noncontrast head CT performed earlier today 02/21/2021. FINDINGS: CTA NECK FINDINGS Aortic arch: Standard aortic branching. The visualized aortic arch is normal in caliber. No hemodynamically significant innominate or proximal subclavian artery stenosis. Right carotid system: CCA and ICA patent within the neck without stenosis. No significant atherosclerotic disease. Left carotid system: CCA and ICA patent within the neck without stenosis. No significant atherosclerotic disease. Vertebral arteries: Vertebral arteries patent within the neck without stenosis. The right vertebral artery is dominant. Skeleton: Cervical dextrocurvature reversal of the expected cervical lordosis. Trace C2-C3 grade 1 anterolisthesis. Cervical spondylosis with multilevel disc space narrowing, disc bulges/disc protrusions, uncovertebral hypertrophy and facet arthrosis. Other neck: Large lobular low-attenuation lesion within the mid to lower left neck, posterior to the left sternocleidomastoid muscle, measuring 7.4 x 7.4 x 8.5 cm (AP x TV x CC) (for instance as seen on series 6, image 104) (series 9, image 155). Additionally, there are retained metallic foreign bodies within the right neck. Upper chest: No consolidation within the imaged lung  apices. Review of the MIP images confirms the above findings CTA HEAD FINDINGS Anterior circulation: The intracranial internal carotid arteries are patent. The M1 middle cerebral arteries are patent. No M2 proximal branch occlusion or high-grade proximal stenosis is identified. The anterior cerebral arteries are patent. Hypoplastic A1 left anterior cerebral artery. No intracranial aneurysm or AVM is identified. Specifically, no abnormal enhancement is appreciated in the region of the acute right basal ganglia hemorrhage. Posterior circulation: The intracranial vertebral arteries are patent. The basilar artery is patent. The posterior cerebral arteries are patent. Posterior communicating arteries are hypoplastic or at bilaterally. Venous sinuses: Within the limitations of contrast timing, no convincing thrombus. Anatomic variants: As described Review of the MIP images confirms the above findings IMPRESSION: CTA neck: 1. The common carotid, internal carotid and vertebral arteries are patent within the neck without stenosis. 2. 7.4 x 7.4 x 8.5 cm lobulated low-attenuation lesion within the mid to lower left neck. 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 3. Retained metallic fragments within the right neck soft tissues. CTA head: 1. No contrast blush in the region of the acute right basal ganglia hemorrhage (spot sign). 2. No appreciable intracranial aneurysm or AVM. 3. No intracranial large vessel occlusion or proximal high-grade arterial stenosis. Electronically Signed: By: Kellie Simmering DO On: 02/21/2021 18:31    Assessment/Plan: Diagnosis: acute parenchymal hemorrhage right basal ganglia  Stroke: Continue secondary stroke prophylaxis and Risk Factor Modification listed below:   Blood Pressure Management:  Continue current  medication with prn's with permisive HTN per primary team Tobacco abuse:   Left sided hemiparesis: fit for orthosis to prevent contractures (resting hand splint for day, wrist cock up splint at night, PRAFO, etc) PT/OT  for mobility, ADL training  Motor recovery: Fluoxetine Labs independently reviewed.  Records reviewed and summated above.  1. Does the need for close, 24 hr/day medical supervision in concert with the patient's rehab needs make it unreasonable for this patient to be served in a less intensive setting? Yes  2. Co-Morbidities requiring supervision/potential complications: HTN (monitor and provide prns in accordance with increased physical exertion and pain), tobacco/polysubstance use (counsel), DM with hyperglycemia (Monitor in accordance with exercise and adjust meds as necessary), hyponatremia (repeat labs, treat if necessary), leukocytosis (repeat labs, cont to monitor for signs and symptoms of infection, further workup if indicated) 3. Due to bladder management, bowel management, safety, skin/wound care, disease management, medication administration, pain management and patient education, does the patient require 24 hr/day rehab nursing? Yes 4. Does the patient require coordinated care of a physician, rehab nurse, therapy disciplines of PT/OT/SLP to address physical and functional deficits in the context of the above medical diagnosis(es)? Yes Addressing deficits in the following areas: balance, endurance, locomotion, strength, transferring, bathing, dressing, toileting, cognition, speech and psychosocial support 5. Can the patient actively participate in an intensive therapy program of at least 3 hrs of therapy per day at least 5 days per week? Potentially 6. The potential for patient to make measurable gains while on inpatient rehab is excellent 7. Anticipated functional outcomes upon discharge from inpatient rehab are supervision and min assist  with PT, supervision and min assist  with OT, modified independent with SLP. 8. Estimated rehab length of stay to reach the above functional goals is: 14-17 days. 9. Anticipated discharge destination: Home 10. Overall Rehab/Functional Prognosis: good  RECOMMENDATIONS: This patient's condition is appropriate for continued rehabilitative care in the following setting: CIR if adequate caregiver support available upon discharge. Patient has agreed to participate in recommended program. Yes Note that insurance prior authorization may be required for reimbursement for recommended care.  Comment: Rehab Admissions Coordinator to follow up.  I have personally performed a face to face diagnostic evaluation, including, but not limited to relevant history and physical exam findings, of this patient and developed relevant assessment and plan.  Additionally, I have reviewed and concur with the physician assistant's documentation above.   Delice Lesch, MD, ABPMR Lavon Paganini Angiulli, PA-C 02/23/2021

## 2021-02-23 NOTE — Progress Notes (Signed)
Physical Therapy Treatment Patient Details Name: Chad Coleman MRN: [id number] DOB: 11/11/1961 Today's Date: 02/23/2021    History of Present Illness 60 yo male presents to [facility] on 3/28 with acute onset of L weakness, numbness, and L facial droop. CTH R BG ICH, suspect hypertensive. PMH includes hypertension, tobacco use, noncompliance with antihypertensive medications, and cocaine abuse.    PT Comments    Pt with improved motor sequencing with multi-modal cues this date, transitioning supine > sit with HOB elevated with modAx1 this date. However, pt continues to require up to [facility] for static sitting balance and mod-maxAx2 for standing and transfers, primarily due to his L neglect, L weakness, and pushing syndrome to the L. Pt reports his believed midline in sitting was ~30-40 degrees tilted to the L and was only able to maintain proper midline for ~5 seconds with min guard once positioned there. Pt needing continual redirection for proper R hand and foot placement in standing to avoid pushing also. Focused on weight shifting to the R when standing with pt being cued to push his R hip into PT's hand, noted success. Will continue to follow acutely. Current recommendations remain appropriate.    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 Pt able to reach  and pull on L bed rail with R hand when cued verbally and visually to look at rail. ModA to complete movement of L leg off EOB and ascend trunk.    Transfers Overall transfer level: Needs assistance Equipment used: 2 person hand held assist Transfers: Sit to/from [facility] Sit to Stand: Mod assist;+2 physical assistance;+2 safety/equipment Stand pivot transfers: Mod assist;Max assist;+2 physical assistance;+2 safety/equipment       General transfer comment: ModAx2 with bil knee block, esp on L, and physical assistance under buttocks to extend hips to come to full stand. Pt tends to try to move his R leg laterally and place his R hand on a surface to push himself to L, needing repeated cues to not do so and readjust positioning. Mod-maxAx2 with bil knee block to stand pivot and direct buttocks safely into chair EOB > chair towards L. Pt tends to try to overshoot chair towards L when descending to sit.  Ambulation/Gait             General Gait Details: Pre-gait training, cuing pt to shift weight laterally, esp to R, with bil knees blocked with L knee buckling on occasion, and mod-maxAx2 to prevent L lean/push. PT anterior to pt and tech on L side to block pushing, cuing pt to keep R hand on therapist's waist instead of reaching for chair to push off, success. Improved R weight shifting when cued to push R hip into PT's hand, x~10 reps.   Stairs             Wheelchair  Mobility    Modified Rankin (Stroke Patients Only) Modified Rankin (Stroke Patients Only) Pre-Morbid Rankin Score: No symptoms Modified Rankin: Severe disability     Balance Overall balance assessment: Needs assistance Sitting-balance support: Single extremity supported;Feet supported Sitting balance-Leahy Scale: Poor Sitting balance - Comments: Pt with L push with R leg and hand. Provided verbal and visual cues to match his trunk to PT's anterior to him, match his trunk to PT's arm tilting to  vertal position, and recognize proximity of L arm to bed/chair compared to R. Once placed in midline pt able to maintain for up to ~5 sec with min guard, but then reverts back to L lean/push with up to modA to block. Postural control: Left lateral lean Standing balance support: Single extremity supported Standing balance-Leahy Scale: Poor Standing balance comment: Rehab tech on pt's L to block pushing and PT anterior to pt with bil knee block and cues for pt to keep R hand on PT's waist rather than push off chair, mod-maxA for balance with cues to shift weight to R.                            Cognition Arousal/Alertness: Lethargic (periods of drowsiness, followed by periods of wakefulness) Behavior During Therapy: Flat affect Overall Cognitive Status: Impaired/Different from baseline Area of Impairment: Memory;Attention;Following commands;Safety/judgement;Problem solving;Awareness                   Current Attention Level: Sustained Memory: Decreased short-term memory;Decreased recall of precautions Following Commands: Follows one step commands with increased time;Follows one step commands inconsistently Safety/Judgement: Decreased awareness of safety;Decreased awareness of deficits Awareness: Intellectual Problem Solving: Slow processing;Decreased initiation;Requires verbal cues;Requires tactile cues;Difficulty sequencing General Comments: Pt with poor awareness of midline and safe sitting EOB, contraversive pushing noted towards L with little awareness of this despite repeated multi-modal cues to attend to it and correct. Pt follows most mobility commands, at times requires multimodal cuing to complete. Pt with periods of eye closing during session, opens eyes on command. L inattention, attends with visual and verbal cuing and extra time.      Exercises      General Comments General comments (skin integrity, edema, etc.): BP 167/85 end of session, RN aware of high BP  and cleared pt to work with PT prior to session      Pertinent Vitals/Pain Pain Assessment: Faces Faces Pain Scale: Hurts a little bit Pain Location: headache Pain Descriptors / Indicators: Guarding;Grimacing;Headache Pain Intervention(s): Limited activity within patient's tolerance;Monitored during session;Repositioned;Premedicated before session    Home Living                      Prior Function            PT Goals (current goals can now be found in the care plan section) Acute Rehab PT Goals Patient Stated Goal: did not state but agreeable to participate PT Goal Formulation: With patient Time For Goal Achievement: 03/08/21 Potential to Achieve Goals: Good Progress towards PT goals: Progressing toward goals    Frequency    Min 4X/week      PT Plan Current plan remains appropriate    Co-evaluation              AM-PAC PT "6 Clicks" Mobility   Outcome Measure  Help needed turning from your back to your side while in a flat bed without using bedrails?: A Lot Help needed moving from  lying on your back to sitting on the side of a flat bed without using bedrails?: A Lot Help needed moving to and from a bed to a chair (including a wheelchair)?: A Lot Help needed standing up from a chair using your arms (e.g., wheelchair or bedside chair)?: A Lot Help needed to walk in hospital room?: Total Help needed climbing 3-5 steps with a railing? : Total 6 Click Score: 10    End of Session Equipment Utilized During Treatment: Gait belt Activity Tolerance: Patient limited by fatigue Patient left: in chair;with call bell/phone within reach;with chair alarm set Nurse Communication: Mobility status;Other (comment) (BP) PT Visit Diagnosis: Hemiplegia and hemiparesis;Unsteadiness on feet (R26.81);Muscle weakness (generalized) (M62.81);Difficulty in walking, not elsewhere classified (R26.2);Other symptoms and signs involving the nervous system (R29.898) Hemiplegia -  Right/Left: Left Hemiplegia - dominant/non-dominant: Dominant Hemiplegia - caused by: Nontraumatic intracerebral hemorrhage     Time: 9802-2179 PT Time Calculation (min) (ACUTE ONLY): 28 min  Charges:  $Therapeutic Activity: 8-22 mins $Neuromuscular Re-education: 8-22 mins                     Chad Coleman, PT, DPT Acute Rehabilitation Services  Pager: [phone] Office: [phone]    Chad Coleman 02/23/2021, 3:07 PM

## 2021-02-24 LAB — BASIC METABOLIC PANEL
Anion gap: 10 (ref 5–15)
BUN: 13 mg/dL (ref 6–20)
CO2: 24 mmol/L (ref 22–32)
Calcium: 8.5 mg/dL — ABNORMAL LOW (ref 8.9–10.3)
Chloride: 92 mmol/L — ABNORMAL LOW (ref 98–111)
Creatinine, Ser: 1 mg/dL (ref 0.61–1.24)
GFR, Estimated: >60 mL/min (ref >60)
Glucose, Bld: 185 mg/dL — ABNORMAL HIGH (ref 70–99)
Potassium: 3 mmol/L — ABNORMAL LOW (ref 3.5–5.1)
Sodium: 126 mmol/L — ABNORMAL LOW (ref 135–145)

## 2021-02-24 LAB — GLUCOSE, CAPILLARY
Glucose-Capillary: 142 mg/dL — ABNORMAL HIGH (ref 70–99)
Glucose-Capillary: 131 mg/dL — ABNORMAL HIGH (ref 70–99)
Glucose-Capillary: 171 mg/dL — ABNORMAL HIGH (ref 70–99)
Glucose-Capillary: 195 mg/dL — ABNORMAL HIGH (ref 70–99)
Glucose-Capillary: 137 mg/dL — ABNORMAL HIGH (ref 70–99)
Glucose-Capillary: 149 mg/dL — ABNORMAL HIGH (ref 70–99)

## 2021-02-24 MED ORDER — INSULIN ASPART 100 UNIT/ML ~~LOC~~ SOLN
0.0000 [IU] | SUBCUTANEOUS | Status: DC
  Administered 2021-02-24 (×2): 1 [IU] via SUBCUTANEOUS
  Administered 2021-02-24: 2 [IU] via SUBCUTANEOUS
  Administered 2021-02-25: 1 [IU] via SUBCUTANEOUS
  Administered 2021-02-25: 2 [IU] via SUBCUTANEOUS
  Administered 2021-02-25 – 2021-02-28 (×12): 1 [IU] via SUBCUTANEOUS
  Administered 2021-02-28: 2 [IU] via SUBCUTANEOUS
  Administered 2021-02-28 – 2021-03-01 (×3): 1 [IU] via SUBCUTANEOUS
  Administered 2021-03-01: 7 [IU] via SUBCUTANEOUS
  Administered 2021-03-01: 2 [IU] via SUBCUTANEOUS
  Administered 2021-03-01: 1 [IU] via SUBCUTANEOUS
  Administered 2021-03-02: 2 [IU] via SUBCUTANEOUS
  Administered 2021-03-02 (×3): 1 [IU] via SUBCUTANEOUS
  Administered 2021-03-02: 2 [IU] via SUBCUTANEOUS
  Administered 2021-03-03 (×2): 1 [IU] via SUBCUTANEOUS
  Administered 2021-03-03: 2 [IU] via SUBCUTANEOUS
  Administered 2021-03-03 (×2): 1 [IU] via SUBCUTANEOUS
  Administered 2021-03-03: 2 [IU] via SUBCUTANEOUS
  Administered 2021-03-04 (×3): 1 [IU] via SUBCUTANEOUS
  Administered 2021-03-05: 2 [IU] via SUBCUTANEOUS
  Administered 2021-03-05 – 2021-03-07 (×6): 1 [IU] via SUBCUTANEOUS

## 2021-02-24 MED ORDER — AMLODIPINE BESYLATE 10 MG PO TABS
10.0000 mg | ORAL_TABLET | Freq: Every day | ORAL | Status: DC
  Administered 2021-02-25 – 2021-04-02 (×37): 10 mg via ORAL
  Filled 2021-02-24 (×31): qty 1
  Filled 2021-02-24: qty 2
  Filled 2021-02-24 (×9): qty 1

## 2021-02-24 MED ORDER — AMLODIPINE BESYLATE 5 MG PO TABS
5.0000 mg | ORAL_TABLET | Freq: Once | ORAL | Status: AC
  Administered 2021-02-24: 5 mg via ORAL
  Filled 2021-02-24: qty 1

## 2021-02-24 MED ORDER — PANTOPRAZOLE SODIUM 40 MG PO TBEC
40.0000 mg | DELAYED_RELEASE_TABLET | Freq: Every day | ORAL | Status: DC
  Administered 2021-02-24 – 2021-03-26 (×31): 40 mg via ORAL
  Filled 2021-02-24 (×31): qty 1

## 2021-02-24 NOTE — Progress Notes (Signed)
  Speech Language Pathology Treatment: Dysphagia;Cognitive-Linquistic  Patient Details Name: Chad Coleman MRN: [id number] DOB: 1961-03-14 Today's Date: 02/24/2021 Time: 1450-1506 SLP Time Calculation (min) (ACUTE ONLY): 16 min  Assessment / Plan / Recommendation Clinical Impression  Pt assisted with lunch meal. Has been eating minimally.  Needed verbal/visual cues to attend to midline to find containers.  Pt repeated asked where his water was- when cued to find at midline he intermittently needed physical assist to locate it.  Mod verbal cues needed to reduce speed of self-feeding and to attend to left cheek for pocketing, left anterior spillage.  Impulsivity remains obstacle; though pt can verbalize now that it impacts function, he still requires cues in real time to manage it. Pt repeatedly asked for phone (he states roommate has it).  He demonstrated no concerns for aspiration during lunch; deficits are primarily oral.  Required verbal cue to wash face after meal. He attended to left side of face spontaneously.  Pt will benefit from ongoing SLP for cognition and swallowing.     HPI HPI: 60 y.o. male with a medical history significant for hypertension, tobacco use, noncompliance with antihypertensive medications, and cocaine abuse who presented to the ED via EMS as a Code Stroke for evaluation of acute onset of left-sided weakness, numbness, and left facial droop. Dx right basal ganglia ICH.      SLP Plan  Continue with current plan of care       Recommendations  Diet recommendations: Dysphagia 2 (fine chop);Thin liquid Liquids provided via: Cup;Straw Medication Administration: Whole meds with puree Supervision: Patient able to self feed;Intermittent supervision to cue for compensatory strategies Compensations: Minimize environmental distractions;Slow rate;Lingual sweep for clearance of pocketing;Monitor for anterior loss                Oral Care Recommendations: Oral care  BID Follow up Recommendations: Inpatient Rehab SLP Visit Diagnosis: Dysphagia, oral phase (R13.11) Plan: Continue with current plan of care       GO                Chad Coleman 02/24/2021, 3:09 PM  Welma Mccombs L. Tivis Ringer, [place] Office number [phone]

## 2021-02-24 NOTE — Progress Notes (Signed)
Physical Therapy Treatment Patient Details Name: Chad Coleman MRN: [id number] DOB: 1961-11-08 Today's Date: 02/24/2021    History of Present Illness 60 yo male presents to [facility] on 3/28 with acute onset of L weakness, numbness, and L facial droop. CTH R BG ICH, suspect hypertensive. PMH includes hypertension, tobacco use, noncompliance with antihypertensive medications, and cocaine abuse.    PT Comments    Focused session on breaking pushing syndrome in sitting and standing, with noted improvements in problem-solving to find midline this date. Pt required extensive cues but was able to compare his body position in regards to proximity to objects on each side of him to find the middle and compare his body position to PT's anterior to him to correct himself in sitting. However, he was unable to maintain midline for > 15 seconds before he began to push to the L again. Attempted to reduced pushing through eliminating feet support and progressing from leaning on R elbow > R hand on bed, min success. Improved midline standing this date once R foot was blocked from being placed more laterally to push also. Will continue to follow acutely. Current recommendations remain appropriate.    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 Cues to reach for R bed rail with R UE and ascend trunk, success.  ModA to manage trunk and legs back into bed end of session.    Transfers Overall transfer level: Needs assistance Equipment used: 2 person hand held assist Transfers: Sit to/from Stand Sit to Stand: Mod assist;+2 physical assistance;+2 safety/equipment         General transfer comment: ModAx2 with bil knee block, esp on L, and physical assistance under buttocks to extend hips to come to full stand. Pt tends to try to move his R leg laterally and place his R hand on a surface to push himself to L, needing repeated cues to not do so and readjust positioning.  Ambulation/Gait             General Gait Details: Pre-gait training, cuing pt to shift weight laterally, esp to R, with bil knees blocked with L knee buckling on occasion, and mod-maxAx2 to prevent L lean/push. PT anterior to pt and tech on L side to block pushing, cuing pt to keep R hand on therapist's waist instead of reaching for bed to push off, success. Improved R weight shifting when cued to push R hip into PT's hand, x~5 reps. Blocked R fooot from scooting laterally to push.   Stairs             Wheelchair Mobility    Modified Rankin (Stroke Patients Only) Modified Rankin (Stroke Patients Only) Pre-Morbid Rankin Score: No symptoms Modified Rankin: Severe disability     Balance Overall balance assessment: Needs assistance Sitting-balance support: Single extremity supported;Feet supported Sitting balance-Leahy Scale: Poor Sitting balance - Comments: Pt with L push with  R leg and hand. Provided verbal and visual cues to match his trunk to PT's anterior to him and recognize proximity of L side to objects or people compared to R to find middle. Improved recognition and correction when cued, but pt still believes his midline is far to the L. Once placed in midline pt able to maintain for up to ~15 sec with min guard, but then reverts back to L lean/push with up to modA to block. Pt placed sitting leaning to R on R  elbow > R hand lateral to body > R hand more proximal and feet floating off floor in attempts to break pushing tendency, min success. Postural control: Left lateral lean Standing balance support: Single extremity supported Standing balance-Leahy Scale: Poor Standing balance comment: Rehab tech on pt's L to block pushing and PT anterior to pt with bil knee block and cues for pt to keep R hand on PT's waist rather than push off bed and blocking of R foot from scooting laterally to push, mod-maxA for balance with cues to shift weight to R.                            Cognition Arousal/Alertness: Awake/alert (periods of drowsiness with sitting up though) Behavior During Therapy: Flat affect Overall Cognitive Status: Impaired/Different from baseline Area of Impairment: Memory;Attention;Following commands;Safety/judgement;Problem solving;Awareness                   Current Attention Level: Sustained Memory: Decreased short-term memory;Decreased recall of precautions Following Commands: Follows one step commands with increased time;Follows one step commands consistently;Follows multi-step commands inconsistently Safety/Judgement: Decreased awareness of safety;Decreased awareness of deficits Awareness: Intellectual Problem Solving: Slow processing;Decreased initiation;Requires verbal cues;Requires tactile cues;Difficulty sequencing General Comments: Pt with poor awareness of midline and safe sitting EOB, contraversive pushing noted towards L with little awareness of this despite repeated multi-modal cues to attend to it and correct. However, improved problem-solving when cued to acknowledge his proximity to objects and adjust to be in middle. Pt follows most mobility commands, at times requires multimodal cuing to complete. Pt with periods of eye closing during session sitting EOB. L inattention, attends with visual and verbal cuing and extra time.      Exercises      General  Comments General comments (skin integrity, edema, etc.): BP 162/88 end of session      Pertinent Vitals/Pain Pain Assessment: 0-10 Pain Score: 7  Pain Location: R shoulder Pain Descriptors / Indicators: Guarding;Grimacing;Aching Pain Intervention(s): Limited activity within patient's tolerance;Monitored during session;Repositioned;Patient requesting pain meds-RN notified    Home Living                      Prior Function            PT Goals (current goals can now be found in the care plan section) Acute Rehab PT Goals Patient Stated Goal: to stand and get water PT Goal Formulation: With patient Time For Goal Achievement: 03/08/21 Potential to Achieve Goals: Good Progress towards PT goals: Progressing toward goals    Frequency    Min 4X/week      PT Plan Current plan remains appropriate    Co-evaluation              AM-PAC PT "6 Clicks" Mobility   Outcome Measure  Help needed turning from your back to your side while in a flat bed without using bedrails?: A Lot Help  needed moving from lying on your back to sitting on the side of a flat bed without using bedrails?: A Lot Help needed moving to and from a bed to a chair (including a wheelchair)?: A Lot Help needed standing up from a chair using your arms (e.g., wheelchair or bedside chair)?: A Lot Help needed to walk in hospital room?: Total Help needed climbing 3-5 steps with a railing? : Total 6 Click Score: 10    End of Session Equipment Utilized During Treatment: Gait belt Activity Tolerance: Patient limited by fatigue;Patient limited by pain Patient left: with call bell/phone within reach;in bed;with bed alarm set;with restraints reapplied (posey belt per RN request) Nurse Communication: Mobility status;Other (comment);Patient requests pain meds (BP) PT Visit Diagnosis: Hemiplegia and hemiparesis;Unsteadiness on feet (R26.81);Muscle weakness (generalized) (M62.81);Difficulty in walking, not  elsewhere classified (R26.2);Other symptoms and signs involving the nervous system (R29.898) Hemiplegia - Right/Left: Left Hemiplegia - dominant/non-dominant: Dominant Hemiplegia - caused by: Nontraumatic intracerebral hemorrhage     Time: 1203-1227 PT Time Calculation (min) (ACUTE ONLY): 24 min  Charges:  $Neuromuscular Re-education: 23-37 mins                     Moishe Spice, PT, DPT Acute Rehabilitation Services  Pager: [phone] Office: [place] 02/24/2021, 1:35 PM

## 2021-02-24 NOTE — Progress Notes (Addendum)
STROKE TEAM PROGRESS NOTE   INTERVAL HISTORY No acute events overnight.  He reports that he is doing alright today. He reports he has some sensation in his Left leg today. Discussed needing to increase his Amlodipine to better control his BP with the goal of getting him off the Cleviprex and out of the ICU to the floor. He had no other concerns at present.  Vitals:   02/24/21 0715 02/24/21 0730 02/24/21 0745 02/24/21 0753  BP: (!) 155/81 (!) 159/81 (!) 167/79 (!) 151/92  Pulse: 77 78 77 80  Resp: 16 16 15 15   Temp:      TempSrc:      SpO2: 95% 95% 96% 96%   CBC:  Recent Labs  Lab 02/21/21 1730 02/21/21 1737 02/23/21 0858  WBC 6.5  --  13.2*  NEUTROABS 4.3  --   --   HGB 15.7 16.3 15.4  HCT 49.3 48.0 45.6  MCV 91.0  --  86.5  PLT 315  --  378   Basic Metabolic Panel:  Recent Labs  Lab 02/21/21 1730 02/21/21 1737 02/23/21 0858  NA 138 141 132*  K 4.6 4.4 3.8  CL 106 105 100  CO2 24  --  21*  GLUCOSE 118* 118* 157*  BUN 12 16 11   CREATININE 1.17 1.10 1.12  CALCIUM 8.9  --  8.9   Lipid Panel:  Recent Labs  Lab 02/23/21 0858  TRIG 380*   HgbA1c:  Recent Labs  Lab 02/22/21 0536  HGBA1C 6.4*   Urine Drug Screen:  Recent Labs  Lab 02/22/21 0005  LABOPIA NONE DETECTED  COCAINSCRNUR POSITIVE*  LABBENZ NONE DETECTED  AMPHETMU NONE DETECTED  THCU NONE DETECTED  LABBARB NONE DETECTED    Alcohol Level  Recent Labs  Lab 02/21/21 1730  ETH <10    IMAGING past 24 hours No results found.  PHYSICAL EXAM Blood pressure (!) 151/92, pulse 80, temperature 99.3 F (37.4 C), temperature source Axillary, resp. rate 15, SpO2 96 %.  General: alert and awake, african [place] male, no apparent distress  Lungs: Symmetrical Chest rise, no labored breathing  Cardio: Regular Rhythm  Abdomen: Soft, non-tender  Neuro: Alert, oriented, thought content appropriate. Speech fluent without evidence of aphasia.  Able to follow all commands without  difficulty. Cranial Nerves: II:  Visual fields grossly normal, pupils equal, round, reactive to light and accommodation III,IV, VI: ptosis not present, extra-ocular motions intact bilaterally V,VII: left sided facial droop, facial light touch sensation normal bilaterally VIII: hearing normal bilaterally IX,X: uvula rises symmetrically XI: bilateral shoulder shrug XII: midline tongue extension without atrophy or fasciculations  Motor: Right :  Upper extremity   5/5                                      Left:     Upper extremity   2/5             Lower extremity   5/5                                                  Lower extremity   3/5 Tone and bulk:normal tone throughout; no atrophy noted Reflexes present on Right but absent on Left Right plantar down going Left equivocal Sensory: Reports  some sensation in his left leg Gait: deferred    ASSESSMENT/PLAN Chad Coleman is a 60 y.o. male with history of hypertension, tobacco use, noncompliance with antihypertensive medications, and cocaine abusepresenting with Left sided weakness and left facial droop.[facility] as a Code Stroke for evaluation of acute onset of left-sided weakness, numbness, and left facial droop. Last seen normal by his roommate at 11:30 this morning but the patient states that his weakness onset was approximately 45-minutes prior to hospital arrival (arrivaed at 17:27). Of note was a recent ER visit where he was found to be hypertensive and reported that he had been out of his hydrochlorothiazide and felt that it did not help with his hypertension but just caused polyuria.   Overnight he was able to be taken off the Cleviprex and his BP was managed with the PRN medications. However, when he awoke his pressure increased and to maintain systolic < 166 the Cleviprex was restarted. Increased his Amlodipine to 10 mg and will wean again. Hopeful to get him to the floor today pending his BP control. Reports  some sensation in his left leg today which is an improvement from yesterday when he only had sensation in his Right Arm and Right Leg.  Plan is to discharge to CIR when stable.   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  Code Stroke CT head-Approximately 5.2 cm acute intraparenchymal hemorrhage in the lateral right basal ganglia, as detailed above. Mass effect results in effacement of the right lateral ventricle and slight (2 mm) of leftward midline shift.  CTA head-No contrast blush in the region of the acute right basal ganglia hemorrhage (spot sign). No appreciable intracranial aneurysm or AVM. No intracranial large vessel occlusion or proximal high-grade arterial stenosis.  CTA neck-The common carotid, internal carotid and vertebral arteries are patent within the neck without stenosis. 7.4 x 7.4 x 8.5 cm lobulated low-attenuation lesion within the mid to lower left neck. 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 Retained metallic fragments within the right neck soft tissues.  CT Head 3/29 -Stable right basal ganglia intraparenchymal hematoma and associated mild mass effect. No hydrocephalus. No interval hemorrhage.  MRI- 5.5 x 3.1 cm right basal ganglia hemorrhage with mild surrounding edema and 3 mm leftward midline shift. This is slightly increased compared with previous CT scan. Tiny sub centimeter infarcts adjacent to the hemorrhage in the right temporoparietal cortex and posterior high right frontal lobe.  2D Echo - EF: 65-70%. No wall motion abnormality  LDL52  HgbA1c6.4  VTE prophylaxis -Lovenox        Diet     DIET DYS 2 Room service appropriate? No;  Fluid consistency: Thin         No antithromboticprior to admission, now on No antithromboticfor now given intracranial hemorrhage.  Therapy recommendations:CIR  Disposition:Pending  Hypertension  Home meds:Amlodipine, HCTZ  Stable  Restarted on Cleviprexthis AM will attempt to wean again  Increase Amlodipine to 10 mg daily  Continue HCTZ 25 mg daily  Currently on Labetalol 20 mg q2 PRN Systolic >063  Currently on Hydralazine 20 mg q6 PRN Systolic>160   Long-term BP goal normotensive  Hyperlipidemia  Home meds:none  LDL 52, goal < 70  High intensity statinAtorvastatin 10 mg daily  Continue statin at discharge  [place] meds:None  HgbA1c 6.4, goal < 7.0  CBGs    Recent Labs (last 2 labs)          Recent Labs     02/22/21  0339  02/22/21  0747  02/22/21  1140   GLUCAP  140*  127*  179*    ?   SSI  Other Stroke Risk Factors  Cigarette smoker-advised to stop smoking  Substance abuse - UDS: THC NONE DETECTED, Cocaine POSITIVE. Patient advised to stop using due to stroke risk.   Hospital day # 3  Fatima Sanger MD Resident I have personally obtained history,examined this patient, reviewed notes, independently viewed imaging studies, participated in medical decision making and plan of care.ROS completed by me personally and pertinent positives fully documented  I have made any additions or clarifications directly to the above note. Agree with note above. . Neurological exam is unchanged but his blood pressure still is difficult to control requiring Cleviprex drip.  Plan to increase oral blood pressure medications and continue as needed labetalol and hydralazine and try to wean Cleviprex drip.  Hopefully transfer to floor bed in the next couple of days and inpatient rehab later this week.  No family available at the bedside for discussion today. This patient is critically ill and at significant risk of  neurological worsening, death and care requires constant monitoring of vital signs, hemodynamics,respiratory and cardiac monitoring, extensive review of multiple databases, frequent neurological assessment, discussion with family, other specialists and medical decision making of high complexity.I have made any additions or clarifications directly to the above note.This critical care time does not reflect procedure time, or teaching time or supervisory time of PA/NP/Med Resident etc but could involve care discussion time.  I spent 30 minutes of neurocritical care time  in the care of  this patient.      Antony Contras, MD Medical Director [place] Pager: [phone] 02/24/2021 3:34 PM  To contact Stroke Continuity provider, please refer to [email or web address] After hours, contact General Neurology

## 2021-02-24 NOTE — Progress Notes (Signed)
PHARMACIST - PHYSICIAN COMMUNICATION  DR:   Leonie Man  CONCERNING: IV to Oral Route Change Policy  RECOMMENDATION: This patient is receiving Protonix by the intravenous route.  Based on criteria approved by the Pharmacy and Therapeutics Committee, the intravenous medication(s) is/are being converted to the equivalent oral dose form(s).   DESCRIPTION: These criteria include:  The patient is eating (either orally or via tube) and/or has been taking other orally administered medications for a least 24 hours  The patient has no evidence of active gastrointestinal bleeding or impaired GI absorption (gastrectomy, short bowel, patient on TNA or NPO).  If you have questions about this conversion, please contact the Pharmacy Department  []   [phone] )  [facility] []   [phone] )  [facility] [x]   [phone] )  [facility] []   [phone] )  [facility] []   [phone] )  [facility]

## 2021-02-24 NOTE — Progress Notes (Signed)
SBP >160 despite PRN hydralazine and labetalol.  Trying to keep off cleviprex gtt per Dr. Leonie Man.  Dr. Leonie Man paged to request further action.

## 2021-02-25 LAB — GLUCOSE, CAPILLARY
Glucose-Capillary: 119 mg/dL — ABNORMAL HIGH (ref 70–99)
Glucose-Capillary: 121 mg/dL — ABNORMAL HIGH (ref 70–99)
Glucose-Capillary: 166 mg/dL — ABNORMAL HIGH (ref 70–99)
Glucose-Capillary: 140 mg/dL — ABNORMAL HIGH (ref 70–99)
Glucose-Capillary: 134 mg/dL — ABNORMAL HIGH (ref 70–99)

## 2021-02-25 MED ORDER — LISINOPRIL 5 MG PO TABS: 5.0000 mg | ORAL_TABLET | Freq: Every day | ORAL | Status: DC

## 2021-02-25 MED ORDER — HYPROMELLOSE (GONIOSCOPIC) 2.5 % OP SOLN
2.0000 [drp] | OPHTHALMIC | Status: DC | PRN
  Administered 2021-02-25 – 2021-04-12 (×4): 2 [drp] via OPHTHALMIC
  Filled 2021-02-25: qty 15

## 2021-02-25 MED ORDER — ACETAMINOPHEN-CODEINE #3 300-30 MG PO TABS
2.0000 | ORAL_TABLET | Freq: Four times a day (QID) | ORAL | Status: DC | PRN
  Administered 2021-02-25 – 2021-03-04 (×16): 2 via ORAL
  Filled 2021-02-25 (×16): qty 2

## 2021-02-25 MED ORDER — POLYETHYLENE GLYCOL 3350 17 G PO PACK
17.0000 g | PACK | Freq: Every day | ORAL | Status: DC
  Administered 2021-02-25 – 2021-04-12 (×33): 17 g via ORAL
  Filled 2021-02-25 (×41): qty 1

## 2021-02-25 MED ORDER — LISINOPRIL 5 MG PO TABS
5.0000 mg | ORAL_TABLET | Freq: Every day | ORAL | Status: DC
  Administered 2021-02-25 – 2021-03-02 (×6): 5 mg via ORAL
  Filled 2021-02-25 (×6): qty 1

## 2021-02-25 MED ORDER — POTASSIUM CHLORIDE CRYS ER 20 MEQ PO TBCR
40.0000 meq | EXTENDED_RELEASE_TABLET | Freq: Once | ORAL | Status: AC
  Administered 2021-02-25: 40 meq via ORAL
  Filled 2021-02-25: qty 2

## 2021-02-25 MED ORDER — CLONIDINE HCL 0.1 MG/24HR TD PTWK
0.1000 mg | MEDICATED_PATCH | TRANSDERMAL | Status: DC
  Administered 2021-02-25: 0.1 mg via TRANSDERMAL
  Filled 2021-02-25: qty 1

## 2021-02-25 MED ORDER — ALUM & MAG HYDROXIDE-SIMETH 200-200-20 MG/5ML PO SUSP
30.0000 mL | ORAL | Status: DC | PRN
  Administered 2021-02-25: 30 mL via ORAL
  Filled 2021-02-25: qty 30

## 2021-02-25 NOTE — Plan of Care (Signed)
  Problem: Nutrition: Goal: Adequate nutrition will be maintained Outcome: Progressing   

## 2021-02-25 NOTE — Plan of Care (Signed)
  Problem: Coping: Goal: Will verbalize positive feelings about self Outcome: Progressing   Problem: Self-Care: Goal: Ability to communicate needs accurately will improve Outcome: Progressing   Problem: Nutrition: Goal: Risk of aspiration will decrease Outcome: Progressing   Problem: Intracerebral Hemorrhage Tissue Perfusion: Goal: Complications of Intracerebral Hemorrhage will be minimized Outcome: Progressing   Problem: Safety: Goal: Non-violent Restraint(s) Outcome: Progressing   Problem: Clinical Measurements: Goal: Will remain free from infection Outcome: Progressing Goal: Respiratory complications will improve Outcome: Progressing Goal: Cardiovascular complication will be avoided Outcome: Progressing   Problem: Elimination: Goal: Will not experience complications related to bowel motility Outcome: Progressing   Problem: Safety: Goal: Ability to remain free from injury will improve Outcome: Progressing

## 2021-02-25 NOTE — Progress Notes (Signed)
Occupational Therapy Treatment Patient Details Name: Chad Coleman MRN: [id number] DOB: Mar 04, 1961 Today's Date: 02/25/2021    History of present illness 60 yo male presents to [facility] on 3/28 with acute onset of L weakness, numbness, and L facial droop. CTH R BG ICH, suspect hypertensive. PMH includes hypertension, tobacco use, noncompliance with antihypertensive medications, and cocaine abuse.   OT comments  Pt progressing towards established OT goals. Pt continues to present with poor cognition, balance, and functional use of L side. Focused session on sitting balance during ADLs and exercises. Pt participating in right reactions with use of midline visual aide. Pt requiring Max A to achieve midline with tendency for left lateral lean; also pushing with RUE intermittently. Pt washing his face at EOB with Max A for sitting balance and cues for sequencing. Despite fatigue and decreased cognition, pt very motivated to participatein therapy. Continue to recommend dc to CIR for intensive OT and will continue to follow acutely as admitted.    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 ModA to manage trunk and legs back into bed end of session.    Transfers Overall transfer level: Needs assistance Equipment used: 2 person  hand held assist Transfers: Sit to/from Stand Sit to Stand: Mod assist;+2 physical assistance;+2 safety/equipment         General transfer comment: ModAx2 with bil knee block, esp on L, and physical assistance under buttocks to extend hips to come to full stand. Blockage of R foot to prevent lateral placement for pushing.    Balance Overall balance assessment: Needs assistance Sitting-balance support: Single extremity supported;Feet supported Sitting balance-Leahy Scale: Poor Sitting balance - Comments: Pt with L push with R leg and hand. Provided verbal and visual cues to match his trunk to therapist's anterior to him and recognize proximity of L or R side to objects or people to find middle. Poor problem-solving and attention to span to recognize he is pushing himself over to L, so much so that he places his L shoulder on the bed without reactional strategies. Pt believes his midline is far to the L. Pt placed sitting leaning to R on R elbow > R hand lateral to body > R hand more proximal to break pushing tendency, min success. Cued pt to reach off BOS and across midline for washcloth to improve dynamic balance and L attention, poor L attention crossing midline. Mod-maxA for balance majority of time. Postural control: Left lateral lean Standing balance support: Single extremity supported Standing balance-Leahy Scale: Poor Standing balance comment: L knee blocked and mod-maxAx2 for balance with pt holding onto therapist with R arm                           ADL either performed or assessed with clinical judgement   ADL Overall  ADL's : Needs assistance/impaired     Grooming: Maximal assistance;Minimal assistance;Sitting Grooming Details (indicate cue type and reason): Max A for sitting balance. Min cues for sequencing and completing task fully. Pt with tendency to only wipe R eye.                             Functional mobility during ADLs: Moderate assistance;+2  for physical assistance (sit<>stand only) General ADL Comments: Pt performing sitting balance activity, grooming, and sit <>stand     Vision   Vision Assessment?: Vision impaired- to be further tested in functional context Additional Comments: Decreased smooth tracking and noting L eye with delay leading to disconjugate gaze. Tendency for R gaze; able to look L   Perception     Praxis      Cognition Arousal/Alertness: Awake/alert (periods of drowsiness increasing as session progressed) Behavior During Therapy: Flat affect Overall Cognitive Status: Impaired/Different from baseline Area of Impairment: Memory;Attention;Following commands;Safety/judgement;Problem solving;Awareness                   Current Attention Level: Sustained Memory: Decreased short-term memory;Decreased recall of precautions Following Commands: Follows one step commands with increased time;Follows one step commands consistently;Follows multi-step commands inconsistently Safety/Judgement: Decreased awareness of safety;Decreased awareness of deficits Awareness: Intellectual Problem Solving: Slow processing;Decreased initiation;Requires verbal cues;Requires tactile cues;Difficulty sequencing General Comments: Pt with poor awareness of midline and safe sitting EOB, contraversive pushing noted towards L with little awareness of this despite repeated multi-modal cues to attend to it and correct. Pt will push himself all the way to the L without reactional strategies to stop him from landing his L shoulder on the bed surface. Pt with poor attention span. Pt follows most mobility commands, at times requires multimodal cuing to complete. Pt with periods of eye closing during session sitting EOB, especially as pt fatigued with session. L inattention, attends with max visual and verbal cuing and extra time.        Exercises Exercises: Other exercises Other Exercises Other Exercises: Lateral leaning both to L and R.  Weight bearing onto L elbow. Other Exercises: Righting reaction and sitting balance. Providing midline visual cue. Pt continued to require Max A for gaining midline sitting balance and then able to maintain for ~30 sec.   Shoulder Instructions       General Comments VSS    Pertinent Vitals/ Pain       Pain Assessment: Faces Faces Pain Scale: Hurts little more Pain Location: R shoulder Pain Descriptors / Indicators: Guarding;Grimacing Pain Intervention(s): Monitored during session;Limited activity within patient's tolerance;Repositioned  Home Living                                          Prior Functioning/Environment              Frequency  Min 2X/week        Progress Toward Goals  OT Goals(current goals can now be found in the care plan section)  Progress towards OT goals: Progressing toward goals  Acute Rehab OT Goals Patient Stated Goal: to stand and get water OT Goal Formulation: With patient Time For Goal Achievement: 03/08/21 Potential to Achieve Goals: Good ADL Goals Pt Will Perform Grooming: with min assist;sitting Pt Will Perform Upper Body Bathing: with min assist;sitting Pt Will Transfer to Toilet: with min assist;with +2 assist;bedside  commode;stand pivot transfer Additional ADL Goal #1: Pt will perform bed mobility with Min A in preparation for ADLs Additional ADL Goal #2: Pt will tolerate sitting at EOB for 10 minutes with Min A in preparation for ADLs  Plan Discharge plan remains appropriate    Co-evaluation    PT/OT/SLP Co-Evaluation/Treatment: Yes Reason for Co-Treatment: For patient/therapist safety;To address functional/ADL transfers PT goals addressed during session: Mobility/safety with mobility;Balance OT goals addressed during session: ADL's and self-care      AM-PAC OT "6 Clicks" Daily Activity     Outcome Measure   Help from another person eating meals?: A Lot Help from another person taking care of personal  grooming?: A Lot Help from another person toileting, which includes using toliet, bedpan, or urinal?: Total Help from another person bathing (including washing, rinsing, drying)?: Total Help from another person to put on and taking off regular upper body clothing?: Total Help from another person to put on and taking off regular lower body clothing?: Total 6 Click Score: 8    End of Session Equipment Utilized During Treatment: Gait belt  OT Visit Diagnosis: Unsteadiness on feet (R26.81);Other abnormalities of gait and mobility (R26.89);Muscle weakness (generalized) (M62.81);Hemiplegia and hemiparesis Hemiplegia - Right/Left: Left Hemiplegia - dominant/non-dominant: Dominant Hemiplegia - caused by: Cerebral infarction   Activity Tolerance Patient tolerated treatment well   Patient Left in bed;with call bell/phone within reach;with bed alarm set   Nurse Communication Mobility status        Time: 1761-6073 OT Time Calculation (min): 25 min  Charges: OT General Charges $OT Visit: 1 Visit OT Treatments $Self Care/Home Management : 8-22 mins  [place], OTR/L Acute Rehab Pager: [phone] Office: [place] 02/25/2021, 5:24 PM

## 2021-02-25 NOTE — Progress Notes (Signed)
  Speech Language Pathology Treatment: Dysphagia;Cognitive-Linquistic  Patient Details Name: Chad Coleman MRN: [id number] DOB: Jul 27, 1961 Today's Date: 02/25/2021 Time: 7989-2119 SLP Time Calculation (min) (ACUTE ONLY): 27 min  Assessment / Plan / Recommendation Clinical Impression  Therapist requested by RN to see pt due to severe holding this morning with breakfast and seemingly significant difficulty with motor planning/auditory processing to execute mastication. Assisted with lunch meal feeding himself with steadying/scooping assist with chicken noodle soup. Suspect his cognition fluctuates as his oral timing and efficiency was adequate without noted pocketing compared to report this morning. No concerns with pt aspirating during session. He was somewhat impulsive with self feeding. He was cooperative but frustrated when experienced difficulty expressing this thought and 1-2 utterances were random and irrelevant. Sustained attention to meal with supervision and intermittent cues. Continue intervention.    HPI HPI: 60 y.o. male with a medical history significant for hypertension, tobacco use, noncompliance with antihypertensive medications, and cocaine abuse who presented to the ED via EMS as a Code Stroke for evaluation of acute onset of left-sided weakness, numbness, and left facial droop. Dx right basal ganglia ICH.      SLP Plan  Continue with current plan of care       Recommendations  Diet recommendations: Dysphagia 2 (fine chop);Thin liquid Liquids provided via: Cup;Straw Medication Administration: Whole meds with puree Supervision: Patient able to self feed;Staff to assist with self feeding;Intermittent supervision to cue for compensatory strategies Compensations: Minimize environmental distractions;Slow rate;Small sips/bites Postural Changes and/or Swallow Maneuvers: Seated upright 90 degrees                Oral Care Recommendations: Oral care BID Follow up  Recommendations: Inpatient Rehab SLP Visit Diagnosis: Dysphagia, oral phase (R13.11);Cognitive communication deficit (E17.408) Plan: Continue with current plan of care       Lamar, Claudeen Leason Willis 02/25/2021, 2:34 PM

## 2021-02-25 NOTE — Progress Notes (Addendum)
STROKE TEAM PROGRESS NOTE   INTERVAL HISTORY No acute events overnight.  Remains intermittently agitated but can be redirected.  Blood pressure yet requiring Cleviprex drip for control despite as needed injections and Norvasc 10 mg daily and hydrochlorothiazide 25 mg daily.  Is complaining of some dry eyes Goal to get cleviprex off and transfer to floor to hospitalist service today if pt remains stable.  Labs show further decrease in sodium to 126 and potassium 3.0  Vitals:   02/25/21 0800 02/25/21 0815 02/25/21 0830 02/25/21 0845  BP: (!) 142/83 (!) 144/85 (!) 144/82 138/89  Pulse: 79 78 78 78  Resp: 14 13 13 10   Temp: 99.3 F (37.4 C)     TempSrc: Oral     SpO2: 92% 94% 94% 94%   CBC:  Recent Labs  Lab 02/21/21 1730 02/21/21 1737 02/23/21 0858  WBC 6.5  --  13.2*  NEUTROABS 4.3  --   --   HGB 15.7 16.3 15.4  HCT 49.3 48.0 45.6  MCV 91.0  --  86.5  PLT 315  --  630   Basic Metabolic Panel:  Recent Labs  Lab 02/23/21 0858 02/24/21 1100  NA 132* 126*  K 3.8 3.0*  CL 100 92*  CO2 21* 24  GLUCOSE 157* 185*  BUN 11 13  CREATININE 1.12 1.00  CALCIUM 8.9 8.5*   Lipid Panel:  Recent Labs  Lab 02/23/21 0858  TRIG 380*   HgbA1c:  Recent Labs  Lab 02/22/21 0536  HGBA1C 6.4*   Urine Drug Screen:  Recent Labs  Lab 02/22/21 0005  LABOPIA NONE DETECTED  COCAINSCRNUR POSITIVE*  LABBENZ NONE DETECTED  AMPHETMU NONE DETECTED  THCU NONE DETECTED  LABBARB NONE DETECTED    Alcohol Level  Recent Labs  Lab 02/21/21 1730  ETH <10    IMAGING past 24 hours No results found.  PHYSICAL EXAM Blood pressure 138/89, pulse 78, temperature 99.3 F (37.4 C), temperature source Oral, resp. rate 10, SpO2 94 %.  General: alert and awake, african [place] male, no apparent distress  Lungs: Symmetrical Chest rise, no labored breathing  Cardio: Regular Rhythm  Abdomen: Soft, non-tender  Neuro: Alert, oriented, thought content appropriate. Speech fluent without  evidence of aphasia.  Able to follow all commands without difficulty. Cranial Nerves: II:  Visual fields grossly normal, pupils equal, round, reactive to light and accommodation III,IV, VI: ptosis not present, extra-ocular motions intact bilaterally V,VII: left sided facial droop, facial light touch sensation normal bilaterally VIII: hearing normal bilaterally IX,X: uvula rises symmetrically XI: bilateral shoulder shrug XII: midline tongue extension without atrophy or fasciculations  Motor: Right :  Upper extremity   5/5                                      Left:     Upper extremity   2/5             Lower extremity   5/5                                                  Lower extremity   3/5 Tone and bulk:normal tone throughout; no atrophy noted Reflexes present on Right but absent on Left Right plantar down going Left equivocal Sensory: Reports some  sensation in his left leg Gait: deferred    ASSESSMENT/PLAN Chad Coleman is a 60 y.o. male with history of hypertension, tobacco use, noncompliance with antihypertensive medications, and cocaine abusepresenting with Left sided weakness and left facial droop.Brought to the[facility] via EMS as a Code Stroke for evaluation of acute onset of left-sided weakness, numbness, and left facial droop. Last seen normal by his roommate at 11:30 this morning but the patient states that his weakness onset was approximately 45-minutes prior to hospital arrival (arrivaed at 17:27). Of note was a recent ER visit where he was found to be hypertensive and reported that he had been out of his hydrochlorothiazide and felt that it did not help with his hypertension but just caused polyuria.   Overnight he was able to be taken off the Cleviprex and his BP was managed with the PRN medications. However, when he awoke his pressure increased and to maintain systolic < 664 the Cleviprex was restarted. Increased his Amlodipine to 10 mg and will wean again and added  lisinopril 5mg  today  . Hopeful to get him to the floor today pending his BP control. Reports some sensation in his left leg today which is an improvement from yesterday when he only had sensation in his Right Arm and Right Leg.  Plan is to discharge to CIR when stable.   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  Code Stroke CT head-Approximately 5.2 cm acute intraparenchymal hemorrhage in the lateral right basal ganglia, as detailed above. Mass effect results in effacement of the right lateral ventricle and slight (2 mm) of leftward midline shift.  CTA head-No contrast blush in the region of the acute right basal ganglia hemorrhage (spot sign). No appreciable intracranial aneurysm or AVM. No intracranial large vessel occlusion or proximal high-grade arterial stenosis.  CTA neck-The common carotid, internal carotid and vertebral arteries are patent within the neck without stenosis. 7.4 x 7.4 x 8.5 cm lobulated low-attenuation lesion within the mid to lower left neck. 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 Retained metallic fragments within the right neck soft tissues.  CT Head 3/29 -Stable right basal ganglia intraparenchymal hematoma and associated mild mass effect. No hydrocephalus. No interval hemorrhage.  MRI- 5.5 x 3.1 cm right basal ganglia hemorrhage with mild surrounding edema and 3 mm leftward midline shift. This is slightly increased compared with previous CT scan. Tiny sub centimeter infarcts adjacent to the hemorrhage in the right temporoparietal cortex and posterior high right frontal lobe.  2D Echo - EF: 65-70%. No wall motion  abnormality  LDL52  HgbA1c6.4  VTE prophylaxis -Lovenox        Diet     DIET DYS 2 Room service appropriate? No; Fluid consistency: Thin         No antithromboticprior to admission, now on No antithromboticfor now given intracranial hemorrhage.  Therapy recommendations:CIR  Disposition:Pending  Hypertension  Home meds:Amlodipine, HCTZ  Stable  Restarted on Cleviprexthis AM will attempt to wean again  Increase Amlodipine to 10 mg daily  Continue HCTZ 25 mg daily  Currently on Labetalol 20 mg q2 PRN Systolic >403  Currently on Hydralazine 20 mg q6 PRN Systolic>160   Long-term BP goal normotensive  Hyperlipidemia  Home meds:none  LDL 52, goal < 70  High intensity statinAtorvastatin 10 mg daily  Continue statin at discharge  [place] meds:None  HgbA1c 6.4, goal < 7.0  CBGs    Recent Labs (last 2 labs)          Recent Labs     02/22/21  0339  02/22/21  0747  02/22/21  1140   GLUCAP  140*  127*  179*    ?   SSI  Other Stroke Risk Factors  Cigarette smoker-advised to stop smoking  Substance abuse - UDS: THC NONE DETECTED, Cocaine POSITIVE. Patient advised to stop using due to stroke risk.   Hospital day # 4  Replace potassium.  Recheck electrolytes in the morning.  Discontinue hydrochlorothiazide and start lisinopril 5 mg daily instead.  Continue to wean Cleviprex drip and hopefully transfer to the floor tomorrow.  Artificial tears for dry eyes.  Mobilize out of bed.  May use restraints for agitation.  Consult medical hospitalist team for medical management and to take over his care. .This patient is critically ill and at significant risk of neurological worsening, death and care requires constant monitoring of vital signs, hemodynamics,respiratory and cardiac monitoring, extensive review of multiple databases, frequent neurological assessment, discussion with family, other  specialists and medical decision making of high complexity.I have made any additions or clarifications directly to the above note.This critical care time does not reflect procedure time, or teaching time or supervisory time of PA/NP/Med Resident etc but could involve care discussion time.  I spent 30 minutes of neurocritical care time  in the care of  this patient.    Antony Contras, MD   To contact Stroke Continuity provider, please refer to [email or web address] After hours, contact General Neurology

## 2021-02-25 NOTE — Progress Notes (Signed)
Physical Therapy Treatment Patient Details Name: Chad Coleman MRN: [id number] DOB: 1961-02-26 Today's Date: 02/25/2021    History of Present Illness 60 yo male presents to [facility] on 3/28 with acute onset of L weakness, numbness, and L facial droop. CTH R BG ICH, suspect hypertensive. PMH includes hypertension, tobacco use, noncompliance with antihypertensive medications, and cocaine abuse.    PT Comments    Pt continues to remain limited in mobility progression by his pushing syndrome to the L. Pt with poor attention span, benefiting from simple and less cues/stimulation. Poor awareness into his deficits as he will push himself all the way to the L so that the L shoulder is on the bed without reactional strategies. Pt attempting to match his body position to therapist's to find and maintain midline, but poor success this date. Improved weight shifting and less pushing in standing this date though. Will continue to follow acutely. Current recommendations remain appropriate.    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 ModA to manage trunk and legs back into bed end of session.    Transfers Overall transfer level: Needs assistance Equipment used: 2 person hand held assist Transfers: Sit to/from Stand Sit to Stand:  Mod assist;+2 physical assistance;+2 safety/equipment         General transfer comment: ModAx2 with bil knee block, esp on L, and physical assistance under buttocks to extend hips to come to full stand. Blockage of R foot to prevent lateral placement for pushing.  Ambulation/Gait             General Gait Details: Pre-gait training, cuing pt to shift weight laterally, esp to R, with bil knees blocked with L knee buckling on occasion, and mod-maxAx2 for steadying and to prevent L lean/push. Allowed L knee to flex intermittently with cues to extend in response to facilitate quads contraction for improved knee stability in standing, mod success.   Stairs             Wheelchair Mobility    Modified Rankin (Stroke Patients Only) Modified Rankin (Stroke Patients Only) Pre-Morbid Rankin Score: No symptoms Modified Rankin: Severe disability     Balance Overall balance assessment: Needs assistance Sitting-balance support: Single extremity supported;Feet supported Sitting balance-Leahy Scale: Poor Sitting balance - Comments: Pt with L push with R leg and hand. Provided verbal and visual cues to match his trunk to therapist's anterior to him and recognize proximity of L or R side to objects or people to find middle. Poor problem-solving and attention to span to recognize he is pushing himself over to L, so much so that he places his L shoulder on the bed without reactional strategies. Pt believes his midline is far to the L. Pt placed sitting leaning to R on R elbow > R hand lateral to body > R hand more  proximal to break pushing tendency, min success. Cued pt to reach off BOS and across midline for washcloth to improve dynamic balance and L attention, poor L attention crossing midline. Mod-maxA for balance majority of time. Postural control: Left lateral lean Standing balance support: Single extremity supported Standing balance-Leahy Scale: Poor Standing balance comment: L knee blocked  and mod-maxAx2 for balance with pt holding onto therapist with R arm                            Cognition Arousal/Alertness: Awake/alert (periods of drowsiness increasing as session progressed) Behavior During Therapy: Flat affect Overall Cognitive Status: Impaired/Different from baseline Area of Impairment: Memory;Attention;Following commands;Safety/judgement;Problem solving;Awareness                   Current Attention Level: Sustained Memory: Decreased short-term memory;Decreased recall of precautions Following Commands: Follows one step commands with increased time;Follows one step commands consistently;Follows multi-step commands inconsistently Safety/Judgement: Decreased awareness of safety;Decreased awareness of deficits Awareness: Intellectual Problem Solving: Slow processing;Decreased initiation;Requires verbal cues;Requires tactile cues;Difficulty sequencing General Comments: Pt with poor awareness of midline and safe sitting EOB, contraversive pushing noted towards L with little awareness of this despite repeated multi-modal cues to attend to it and correct. Pt will push himself all the way to the L without reactional strategies to stop him from landing his L shoulder on the bed surface. Pt with poor attention span. Pt follows most mobility commands, at times requires multimodal cuing to complete. Pt with periods of eye closing during session sitting EOB, especially as pt fatigued with session. L inattention, attends with max visual and verbal cuing and extra time.      Exercises      General Comments        Pertinent Vitals/Pain Pain Assessment: Faces Faces Pain Scale: Hurts little more Pain Location: R shoulder Pain Descriptors / Indicators: Guarding;Grimacing Pain Intervention(s): Limited activity within patient's tolerance;Monitored during session;Repositioned    Home Living                      Prior Function            PT Goals  (current goals can now be found in the care plan section) Acute Rehab PT Goals Patient Stated Goal: to stand and get water PT Goal Formulation: With patient Time For Goal Achievement: 03/08/21 Potential to Achieve Goals: Good Progress towards PT goals: Progressing toward goals    Frequency    Min 4X/week      PT Plan Current plan remains appropriate    Co-evaluation PT/OT/SLP Co-Evaluation/Treatment: Yes Reason for Co-Treatment: Necessary to address cognition/behavior during functional activity;For patient/therapist safety;To address functional/ADL transfers PT goals addressed during session: Mobility/safety with mobility;Balance        AM-PAC PT "6 Clicks" Mobility   Outcome Measure  Help needed turning from your back to your side while in a flat bed without using bedrails?: A Lot Help needed moving from lying on your back to sitting on the side of a flat bed without using bedrails?: A Lot Help needed moving to and from a bed to a chair (including a wheelchair)?: A Lot Help needed standing up from a chair using your arms (e.g., wheelchair or bedside chair)?: A Lot Help needed to walk in hospital room?: Total Help needed climbing 3-5 steps with a railing? : Total 6 Click Score: 10    End of Session Equipment Utilized During Treatment:  Gait belt Activity Tolerance: Patient limited by fatigue;Patient limited by pain Patient left: with call bell/phone within reach;in bed;with bed alarm set;with restraints reapplied Nurse Communication: Mobility status;Patient requests pain meds PT Visit Diagnosis: Hemiplegia and hemiparesis;Unsteadiness on feet (R26.81);Muscle weakness (generalized) (M62.81);Difficulty in walking, not elsewhere classified (R26.2);Other symptoms and signs involving the nervous system (R29.898) Hemiplegia - Right/Left: Left Hemiplegia - dominant/non-dominant: Dominant Hemiplegia - caused by: Nontraumatic intracerebral hemorrhage     Time: 1071-2524 PT Time  Calculation (min) (ACUTE ONLY): 25 min  Charges:  $Neuromuscular Re-education: 8-22 mins                     Moishe Spice, PT, DPT Acute Rehabilitation Services  Pager: [phone] Office: [place] 02/25/2021, 5:06 PM

## 2021-02-25 NOTE — Progress Notes (Signed)
Report given to [facility]. Patient taken to 3W09--no belongings at bedside.

## 2021-02-25 NOTE — Progress Notes (Signed)
Ok to give Kdur 43meq x1 for k+ 3 yesterday per Dr. Leonie Man and recheck bmet in AM.   Onnie Boer, PharmD, BCIDP, AAHIVP, CPP Infectious Disease Pharmacist 02/25/2021 10:08 AM

## 2021-02-26 DIAGNOSIS — I161 Hypertensive emergency: Secondary | ICD-10-CM

## 2021-02-26 LAB — GLUCOSE, CAPILLARY
Glucose-Capillary: 107 mg/dL — ABNORMAL HIGH (ref 70–99)
Glucose-Capillary: 118 mg/dL — ABNORMAL HIGH (ref 70–99)
Glucose-Capillary: 122 mg/dL — ABNORMAL HIGH (ref 70–99)
Glucose-Capillary: 126 mg/dL — ABNORMAL HIGH (ref 70–99)
Glucose-Capillary: 136 mg/dL — ABNORMAL HIGH (ref 70–99)
Glucose-Capillary: 137 mg/dL — ABNORMAL HIGH (ref 70–99)

## 2021-02-26 LAB — HEPATIC FUNCTION PANEL
ALT: 50 U/L — ABNORMAL HIGH (ref 0–44)
AST: 53 U/L — ABNORMAL HIGH (ref 15–41)
Albumin: 3.3 g/dL — ABNORMAL LOW (ref 3.5–5.0)
Alkaline Phosphatase: 39 U/L (ref 38–126)
Bilirubin, Direct: 0.4 mg/dL — ABNORMAL HIGH (ref 0.0–0.2)
Indirect Bilirubin: 1 mg/dL — ABNORMAL HIGH (ref 0.3–0.9)
Total Bilirubin: 1.4 mg/dL — ABNORMAL HIGH (ref 0.3–1.2)
Total Protein: 6.5 g/dL (ref 6.5–8.1)

## 2021-02-26 LAB — BASIC METABOLIC PANEL
Anion gap: 11 (ref 5–15)
BUN: 20 mg/dL (ref 6–20)
CO2: 25 mmol/L (ref 22–32)
Calcium: 8.7 mg/dL — ABNORMAL LOW (ref 8.9–10.3)
Chloride: 91 mmol/L — ABNORMAL LOW (ref 98–111)
Creatinine, Ser: 1.21 mg/dL (ref 0.61–1.24)
GFR, Estimated: >60 mL/min (ref >60)
Glucose, Bld: 114 mg/dL — ABNORMAL HIGH (ref 70–99)
Potassium: 3.1 mmol/L — ABNORMAL LOW (ref 3.5–5.1)
Sodium: 127 mmol/L — ABNORMAL LOW (ref 135–145)

## 2021-02-26 LAB — CBC
HCT: 43.8 % (ref 39.0–52.0)
Hemoglobin: 15.3 g/dL (ref 13.0–17.0)
MCH: 29.6 pg (ref 26.0–34.0)
MCHC: 34.9 g/dL (ref 30.0–36.0)
MCV: 84.7 fL (ref 80.0–100.0)
Platelets: 300 10*3/uL (ref 150–400)
RBC: 5.17 MIL/uL (ref 4.22–5.81)
RDW: 12.2 % (ref 11.5–15.5)
WBC: 7.3 10*3/uL (ref 4.0–10.5)
nRBC: 0 % (ref 0.0–0.2)

## 2021-02-26 LAB — MAGNESIUM: Magnesium: 2.3 mg/dL (ref 1.7–2.4)

## 2021-02-26 LAB — SODIUM, URINE, RANDOM: Sodium, Ur: <10 mmol/L

## 2021-02-26 LAB — OSMOLALITY, URINE: Osmolality, Ur: 299 mOsm/kg — ABNORMAL LOW (ref 300–900)

## 2021-02-26 MED ORDER — BOOST / RESOURCE BREEZE PO LIQD CUSTOM
1.0000 | Freq: Three times a day (TID) | ORAL | Status: DC
  Administered 2021-02-26 – 2021-03-20 (×55): 1 via ORAL
  Filled 2021-02-26 (×3): qty 1

## 2021-02-26 MED ORDER — POTASSIUM CHLORIDE CRYS ER 20 MEQ PO TBCR
40.0000 meq | EXTENDED_RELEASE_TABLET | Freq: Two times a day (BID) | ORAL | Status: DC
  Administered 2021-02-26 – 2021-02-27 (×3): 40 meq via ORAL
  Filled 2021-02-26 (×3): qty 2

## 2021-02-26 MED ORDER — METFORMIN HCL 500 MG PO TABS
500.0000 mg | ORAL_TABLET | Freq: Every day | ORAL | Status: DC
  Administered 2021-02-26 – 2021-04-13 (×45): 500 mg via ORAL
  Filled 2021-02-26 (×49): qty 1

## 2021-02-26 MED ORDER — ATORVASTATIN CALCIUM 10 MG PO TABS
20.0000 mg | ORAL_TABLET | Freq: Every day | ORAL | Status: DC
  Administered 2021-02-27 – 2021-04-13 (×46): 20 mg via ORAL
  Filled 2021-02-26 (×46): qty 2

## 2021-02-26 MED ORDER — ATORVASTATIN CALCIUM 80 MG PO TABS: 80.0000 mg | ORAL_TABLET | Freq: Every day | ORAL | Status: DC

## 2021-02-26 NOTE — Progress Notes (Signed)
STROKE TEAM PROGRESS NOTE   INTERVAL HISTORY No family at the bedside. Pt lying in bed, sleepy drowsy but arousable. Fell back to sleep if without repetitive stimulation. Still has left hemiplegia and right gaze preference. Na 127 today, but leukocytosis improved.   Vitals:   02/25/21 2030 02/25/21 2356 02/26/21 0314 02/26/21 0746  BP: 102/61 (!) 149/91 127/79 (!) 158/83  Pulse: 68 74 79 69  Resp: 17 18 18 16   Temp: 98 F (36.7 C) 97.7 F (36.5 C) 98.2 F (36.8 C) 98.6 F (37 C)  TempSrc: Oral Axillary Oral Oral  SpO2: 98% 99% 100% 96%  Weight:      Height:       CBC:  Recent Labs  Lab 02/21/21 1730 02/21/21 1737 02/23/21 0858 02/26/21 0254  WBC 6.5  --  13.2* 7.3  NEUTROABS 4.3  --   --   --   HGB 15.7   < > 15.4 15.3  HCT 49.3   < > 45.6 43.8  MCV 91.0  --  86.5 84.7  PLT 315  --  254 300   < > = values in this interval not displayed.   Basic Metabolic Panel:  Recent Labs  Lab 02/24/21 1100 02/26/21 0254  NA 126* 127*  K 3.0* 3.1*  CL 92* 91*  CO2 24 25  GLUCOSE 185* 114*  BUN 13 20  CREATININE 1.00 1.21  CALCIUM 8.5* 8.7*  MG  --  2.3   Lipid Panel:  Recent Labs  Lab 02/23/21 0858  TRIG 380*   HgbA1c:  Recent Labs  Lab 02/22/21 0536  HGBA1C 6.4*   Urine Drug Screen:  Recent Labs  Lab 02/22/21 0005  LABOPIA NONE DETECTED  COCAINSCRNUR POSITIVE*  LABBENZ NONE DETECTED  AMPHETMU NONE DETECTED  THCU NONE DETECTED  LABBARB NONE DETECTED    Alcohol Level  Recent Labs  Lab 02/21/21 1730  ETH <10    IMAGING past 24 hours No results found.  PHYSICAL EXAM Blood pressure (!) 158/83, pulse 69, temperature 98.6 F (37 C), temperature source Oral, resp. rate 16, height 5\' 9"  (1.753 m), weight 73.2 kg, SpO2 96 %.   Temp:  [97.7 F (36.5 C)-98.6 F (37 C)] 98.6 F (37 C) (04/02 0746) Pulse Rate:  [60-79] 69 (04/02 0746) Resp:  [14-18] 16 (04/02 0746) BP: (102-158)/(61-98) 158/83 (04/02 0746) SpO2:  [95 %-100 %] 96 % (04/02  0746) Weight:  [73.2 kg] 73.2 kg (04/01 1357)  General - Well nourished, well developed, drowsy sleepy  Ophthalmologic - fundi not visualized due to noncooperation.  Cardiovascular - Regular rhythm and rate.  Neuro - drowsy sleepy, able to open eyes and answer orientation questions but fall back to sleepy if no repetitive stimulation. Orientated to place, year, but wrong on month and his age. Moderate dysarthria. Following simple commands. Paucity of speech. Not cooperative on repeating and naming. Right gaze preference and left gaze incomplete but able to cross midline. Not cooperative with visual field testing. Left facial droop. Left UE and LE 0/5 today with drowsy sleepy state. Sensation subjectively symmetrical. FTN not cooperative.    ASSESSMENT/PLAN [name] is a 60 y.o. male with history of hypertension, tobacco use, noncompliance with antihypertensive medications, and cocaine abusepresenting with Left sided weakness and left facial droop.  ICH - Right BG ICH secondary to uncontrolled hypertension and cocaine use  CT head-ICH at lateral right BG. Mass effect results in effacement of the right lateral ventricle and slight (2 mm) of leftward  midline shift.  CTA head and neck -No spot sign. Otherwise, unremarkable   CT Head 3/29 -Stable right BG ICH and associated mild mass effect. No hydrocephalus. No interval hemorrhage.  MRI- 5.5 x 3.1 cm right BG hemorrhage with mild surrounding edema and 3 mm leftward midline shift. Tiny sub centimeter infarcts adjacent to the hemorrhage in the right temporoparietal cortex and posterior high right frontal lobe (not much appreciated on my review of images).  2D Echo - EF: 65-70%. No wall motion abnormality  LDL52  HgbA1c6.4  VTE prophylaxis -Lovenox  No antithromboticprior to admission, now on No antithromboticfor now given intracranial hemorrhage.  Therapy  recommendations:CIR  Disposition:Pending  Hyponatremia   Na 141-132-126-127  SIADH vs. 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 This finding has an appearance suggestive of a lymphovascular malformation  Recommend non-emergent contrast-enhanced MRI of the neck  Discussed with Dr. Jobe Igo, Radiology, appears to be benign, will consider non-urgent MRI neck soft tissue as outpt   Other Stroke Risk Factors  Leukocytosis WBC 13.2-> 7.3  Hospital day # 5  Neurology will sign off. Please call with questions. Pt will follow up with stroke clinic NP at [facility] in about 4 weeks. Thanks for the consult.  Rosalin Hawking, MD PhD Stroke Neurology 02/26/2021 3:49 PM    To contact Stroke Continuity provider, please refer to [email or web address] After hours, contact General Neurology

## 2021-02-26 NOTE — Progress Notes (Signed)
PROGRESS NOTE   Chad Coleman  OZH:[id number] DOB: 09-29-1961 DOA: 02/21/2021 PCP: Patient, No Pcp Per (Inactive)  Brief Narrative:   60 year old male HTN, tobacco, cocaine-noncompliant on HCTZ Admit 3/28-code stroke NIHSS 13 intracranial hemorrhage right basal ganglia 2/2 HTN/cocaine--UDS on admission positive for cocaine Deficits =-visual field cut left sensory deficit right gaze preference left-sided weakness  3/28 admit-MRI brain 5.5X 3.1 cm hemorrhage + edema 3 mm leftward shift 3/29 LVEF 65-70% grade 1 DD-LDL 52 4/1 dysphagia 2 diet 4/2 transferred to hospitalist service  Has developed in the interim hyponatremia thought to be secondary to HCTZ, increasing free water so med adjustments performed Has been seen and recommended for CIR by therapy services   Hospital-Problem based course  5 x 3 intracranial hemorrhage with 3 mm leftward shift Long recovery expected-we will discuss with family implications of the same Secondary prevention measures = stop smoking/polysubstances, atorvastatin increased to high intensity dosing 80 mg Strict blood pressure control lisinopril 5 hydralazine as needed BP >160 clonidine added 4/1 Hyponatremia hypovolemic Urine osmolality/sodium are both low Will give patient fluid restriction of 1200 cc as this is primarily a water problem not sodium 1 and fluid restriction should automatically raise the sodium over the next 24 to 48 hours Risk of azotemia without AKI Allow supplements with solute to prevent volume depletion-see above discussion New onset DM A1c  6.4 Cont SSI Adding Metformin 500 daily HTN Uncontrolled on admission tight control as above-monitor trends Hypokalemia ?  Related to HCTZ which has been discontinued Continue K. Dur 40 twice daily Daily magnesium Transaminitis likely related to chronic EtOH? Labs done today-no baseline-indicate the same Follow trend tomorrow   DVT prophylaxis: lovenox Code Status: Full Family  Communication: [name] discussed [phone] Disposition:  Status is: Inpatient  Remains inpatient appropriate because:Hemodynamically unstable, Altered mental status, IV treatments appropriate due to intensity of illness or inability to take PO and Inpatient level of care appropriate due to severity of illness   Dispo: The patient is from: Home              Anticipated d/c is to: SNF              Patient currently is not medically stable to d/c.   Difficult to place patient No    Consultants:   Assumed care from CCM  Procedures:   Antimicrobials:     Subjective: Incoherent, intermittently confused Asking for grits--is however able to tell me he used to work in [employer] and lifts weights Then becomes disoriented again and does not know where he is Nursing wondering if his Posey belt can be removed although patient seems a little bit impulsive  Objective: Vitals:   02/25/21 2030 02/25/21 2356 02/26/21 0314 02/26/21 0746  BP: 102/61 (!) 149/91 127/79 (!) 158/83  Pulse: 68 74 79 69  Resp: 17 18 18 16   Temp: 98 F (36.7 C) 97.7 F (36.5 C) 98.2 F (36.8 C) 98.6 F (37 C)  TempSrc: Oral Axillary Oral Oral  SpO2: 98% 99% 100% 96%  Weight:      Height:        Intake/Output Summary (Last 24 hours) at 02/26/2021 1055 Last data filed at 02/26/2021 1000 Gross per 24 hour  Intake 1150.91 ml  Output 2050 ml  Net -899.09 ml   Filed Weights   02/25/21 1357  Weight: 73.2 kg    Examination:  Dense left-sided hemiparesis-power 0/5 drawing up of left leg sensory is intact Did not examine mouth for ocular  reflexes S1-S2 no murmur no rub no gallop on monitors has PVCs bradycardia without pauses No icterus no pallor No lower extremity edema Chest clear no added sound Abdomen soft  Data Reviewed: personally reviewed  Data Sodium 126-->127 Potassium 3.0---3.1 CO2 25 BUNs/creatinine up from 11/1.12-->20/1.2  Hemoglobin 15.3, WBC 7.3   Radiology Studies: No  results found.   Scheduled Meds: . amLODipine  10 mg Oral Daily  . atorvastatin  10 mg Oral Daily  . Chlorhexidine Gluconate Cloth  6 each Topical Q0600  . cloNIDine  0.1 mg Transdermal Weekly  . enoxaparin (LOVENOX) injection  40 mg Subcutaneous Q24H  . influenza vac split quadrivalent PF  0.5 mL Intramuscular Tomorrow-1000  . insulin aspart  0-9 Units Subcutaneous Q4H  . lisinopril  5 mg Oral Daily  . pantoprazole  40 mg Oral QHS  . polyethylene glycol  17 g Oral Daily  . potassium chloride  40 mEq Oral BID  . senna-docusate  1 tablet Oral BID   Continuous Infusions:   LOS: 5 days   Time spent: [place], MD [facility] To contact the attending provider between 7A-7P or the covering provider during after hours 7P-7A, please log into the web site [email or web address] and access using universal [place] password for that web site. If you do not have the password, please call the hospital operator.  02/26/2021, 10:55 AM

## 2021-02-27 LAB — COMPREHENSIVE METABOLIC PANEL
ALT: 56 U/L — ABNORMAL HIGH (ref 0–44)
AST: 51 U/L — ABNORMAL HIGH (ref 15–41)
Albumin: 3.3 g/dL — ABNORMAL LOW (ref 3.5–5.0)
Alkaline Phosphatase: 40 U/L (ref 38–126)
Anion gap: 7 (ref 5–15)
BUN: 21 mg/dL — ABNORMAL HIGH (ref 6–20)
CO2: 26 mmol/L (ref 22–32)
Calcium: 8.7 mg/dL — ABNORMAL LOW (ref 8.9–10.3)
Chloride: 98 mmol/L (ref 98–111)
Creatinine, Ser: 1.25 mg/dL — ABNORMAL HIGH (ref 0.61–1.24)
GFR, Estimated: >60 mL/min (ref >60)
Glucose, Bld: 116 mg/dL — ABNORMAL HIGH (ref 70–99)
Potassium: 4.3 mmol/L (ref 3.5–5.1)
Sodium: 131 mmol/L — ABNORMAL LOW (ref 135–145)
Total Bilirubin: 1.1 mg/dL (ref 0.3–1.2)
Total Protein: 6.4 g/dL — ABNORMAL LOW (ref 6.5–8.1)

## 2021-02-27 LAB — GLUCOSE, CAPILLARY
Glucose-Capillary: 118 mg/dL — ABNORMAL HIGH (ref 70–99)
Glucose-Capillary: 118 mg/dL — ABNORMAL HIGH (ref 70–99)
Glucose-Capillary: 122 mg/dL — ABNORMAL HIGH (ref 70–99)
Glucose-Capillary: 132 mg/dL — ABNORMAL HIGH (ref 70–99)
Glucose-Capillary: 135 mg/dL — ABNORMAL HIGH (ref 70–99)
Glucose-Capillary: 135 mg/dL — ABNORMAL HIGH (ref 70–99)

## 2021-02-27 LAB — CBC
HCT: 45.8 % (ref 39.0–52.0)
Hemoglobin: 15.2 g/dL (ref 13.0–17.0)
MCH: 28.6 pg (ref 26.0–34.0)
MCHC: 33.2 g/dL (ref 30.0–36.0)
MCV: 86.1 fL (ref 80.0–100.0)
Platelets: 227 10*3/uL (ref 150–400)
RBC: 5.32 MIL/uL (ref 4.22–5.81)
RDW: 12.2 % (ref 11.5–15.5)
WBC: 6.6 10*3/uL (ref 4.0–10.5)
nRBC: 0 % (ref 0.0–0.2)

## 2021-02-27 LAB — MAGNESIUM: Magnesium: 2.5 mg/dL — ABNORMAL HIGH (ref 1.7–2.4)

## 2021-02-27 MED ORDER — CLONIDINE HCL 0.1 MG PO TABS
0.3000 mg | ORAL_TABLET | Freq: Two times a day (BID) | ORAL | Status: DC
  Administered 2021-02-27 – 2021-02-28 (×2): 0.3 mg via ORAL
  Filled 2021-02-27 (×2): qty 3

## 2021-02-27 NOTE — TOC Progression Note (Signed)
Transition of Care [facility]) - Progression Note    Patient Details  Name: Chad Coleman MRN: [id number] Date of Birth: 07/15/1961  Transition of Care [facility]) CM/SW Contact  Coralee Pesa, [place] Phone Number: 02/27/2021, 3:17 PM  Clinical Narrative:    CSW was notified by MD that pt is currently medically ready to DC. The plan is CIR and they are following. MD requested that a SNF back up be done, however, pt is extremely unlikely to get placement due to his barriers. CIR noted they would follow up with pt's family to see if CIR will be able to offer a bed. Pt has a sister and a roommate who may be able to offer assistance. If CIR is not an option, charity HH may be able to assist. SW will continue to follow for DC planning.        Expected Discharge Plan and Services                                                 Social Determinants of Health (SDOH) Interventions    Readmission Risk Interventions No flowsheet data found.

## 2021-02-27 NOTE — Progress Notes (Signed)
PROGRESS NOTE   Chad Coleman  ZOX:[id number] DOB: Jun 14, 1961 DOA: 02/21/2021 PCP: Patient, No Pcp Per (Inactive)  Brief Narrative:   60 year old male HTN, tobacco, cocaine-noncompliant on HCTZ Admit 3/28-code stroke NIHSS 13 intracranial hemorrhage right basal ganglia 2/2 HTN/cocaine--UDS on admission positive for cocaine Deficits =-visual field cut left sensory deficit right gaze preference left-sided weakness  3/28 admit-MRI brain 5.5X 3.1 cm hemorrhage + edema 3 mm leftward shift 3/29 LVEF 65-70% grade 1 DD-LDL 52 4/1 dysphagia 2 diet 4/2 transferred to hospitalist service   developed interim hyponatremia 2/2 HCTZ, increasing free water so med adjustments performed Has been seen and recommended for CIR by therapy services He has a roommate his sister lives in [place] so disposition planning at this time-both CIR as well as social work are aware of need for planning   Hospital-Problem based course  5 x 3 intracranial hemorrhage with 3 mm leftward shift Long recovery expected-discussed in detail with the sister on 4/2 long road to recovery Secondary prevention measures = stop smoking/polysubstances, atorvastatin increased to high intensity dosing 80 mg Strict blood pressure control lisinopril 5 hydralazine as needed clonidine increased to 0.3  Hypernatremia hypovolemic/reset osmole stat Urine osmolality/sodium are both low Continue fluid restriction-sodium improved Risk of azotemia without AKI Allow supplements with solute to prevent volume depletion-see above discussion Liberalize fluid intake to allow for more volume Offer fluids New onset DM A1c  6.4 Cont SSI Adding Metformin 500 daily HTN Uncontrolled on admission tight control as above-monitor trends Hypokalemia ? Related to HCTZ which has been discontinued Continue K. Dur 40 twice daily Daily magnesium Transaminitis likely related to chronic EtOH? Labs done today-no baseline-indicate the same Follow trend  tomorrow   DVT prophylaxis: lovenox Code Status: Full Family Communication: [name] discussed [phone] 4/2 fully with me  Disposition:  Status is: Inpatient  Remains inpatient appropriate because:Hemodynamically unstable, Altered mental status, IV treatments appropriate due to intensity of illness or inability to take PO and Inpatient level of care appropriate due to severity of illness   Dispo: The patient is from: Home              Anticipated d/c is to: SNF              Patient currently is not medically stable to d/c.   Difficult to place patient No    Consultants:   Assumed care from CCM  Procedures:   Antimicrobials:     Subjective:  Coherent to some degree today able to verbalize a little better Seems stronger on the right side and more coherent to time day date season questions Dense plegia left side with some hemineglect as well  Objective: Vitals:   02/27/21 0414 02/27/21 0816 02/27/21 1100 02/27/21 1101  BP: (!) 160/102 (!) 169/103 (!) 148/98 (!) 148/90  Pulse: (!) 57 64 74   Resp: 17 14 16    Temp: 99.1 F (37.3 C) 98.2 F (36.8 C) 98.7 F (37.1 C)   TempSrc: Oral Oral Oral   SpO2: 100% 100% 99%   Weight:      Height:        Intake/Output Summary (Last 24 hours) at 02/27/2021 1456 Last data filed at 02/27/2021 1309 Gross per 24 hour  Intake 604 ml  Output 700 ml  Net -96 ml   Filed Weights   02/25/21 1357  Weight: 73.2 kg    Examination:  Dense left-sided hemiparesis-power 0/5 drawing up of left leg sensory is intact S1-S2 no murmur no rub no  gallop on monitors has PVCs bradycardia without pauses No icterus no pallor No lower extremity edema Chest clear no added sound Abdomen soft  Data Reviewed: personally reviewed  Data Sodium 126-->127-->131 Potassium 3.0---3.1-->4.3 CO2 25 BUNs/creatinine up from 11/1.12-->20/1.2-->21/1.25  Hemoglobin 15.2, WBC 6.6   Radiology Studies: No results found.   Scheduled Meds: .  amLODipine  10 mg Oral Daily  . atorvastatin  20 mg Oral Daily  . Chlorhexidine Gluconate Cloth  6 each Topical Q0600  . cloNIDine  0.1 mg Transdermal Weekly  . enoxaparin (LOVENOX) injection  40 mg Subcutaneous Q24H  . feeding supplement  1 Container Oral TID BM  . influenza vac split quadrivalent PF  0.5 mL Intramuscular Tomorrow-1000  . insulin aspart  0-9 Units Subcutaneous Q4H  . lisinopril  5 mg Oral Daily  . metFORMIN  500 mg Oral Q breakfast  . pantoprazole  40 mg Oral QHS  . polyethylene glycol  17 g Oral Daily  . potassium chloride  40 mEq Oral BID  . senna-docusate  1 tablet Oral BID   Continuous Infusions:   LOS: 6 days   Time spent: 15  Nita Sells, MD [facility] To contact the attending provider between 7A-7P or the covering provider during after hours 7P-7A, please log into the web site [email or web address] and access using universal [place] password for that web site. If you do not have the password, please call the hospital operator.  02/27/2021, 2:56 PM

## 2021-02-28 LAB — GLUCOSE, CAPILLARY
Glucose-Capillary: 110 mg/dL — ABNORMAL HIGH (ref 70–99)
Glucose-Capillary: 112 mg/dL — ABNORMAL HIGH (ref 70–99)
Glucose-Capillary: 117 mg/dL — ABNORMAL HIGH (ref 70–99)
Glucose-Capillary: 131 mg/dL — ABNORMAL HIGH (ref 70–99)
Glucose-Capillary: 133 mg/dL — ABNORMAL HIGH (ref 70–99)
Glucose-Capillary: 141 mg/dL — ABNORMAL HIGH (ref 70–99)
Glucose-Capillary: 165 mg/dL — ABNORMAL HIGH (ref 70–99)

## 2021-02-28 LAB — RENAL FUNCTION PANEL
Albumin: 2.9 g/dL — ABNORMAL LOW (ref 3.5–5.0)
Anion gap: 7 (ref 5–15)
BUN: 16 mg/dL (ref 6–20)
CO2: 26 mmol/L (ref 22–32)
Calcium: 8.7 mg/dL — ABNORMAL LOW (ref 8.9–10.3)
Chloride: 101 mmol/L (ref 98–111)
Creatinine, Ser: 1.09 mg/dL (ref 0.61–1.24)
GFR, Estimated: >60 mL/min (ref >60)
Glucose, Bld: 118 mg/dL — ABNORMAL HIGH (ref 70–99)
Phosphorus: 2.7 mg/dL (ref 2.5–4.6)
Potassium: 4.2 mmol/L (ref 3.5–5.1)
Sodium: 134 mmol/L — ABNORMAL LOW (ref 135–145)

## 2021-02-28 LAB — CBC
HCT: 43.2 % (ref 39.0–52.0)
Hemoglobin: 14.2 g/dL (ref 13.0–17.0)
MCH: 29 pg (ref 26.0–34.0)
MCHC: 32.9 g/dL (ref 30.0–36.0)
MCV: 88.3 fL (ref 80.0–100.0)
Platelets: 300 10*3/uL (ref 150–400)
RBC: 4.89 MIL/uL (ref 4.22–5.81)
RDW: 12.2 % (ref 11.5–15.5)
WBC: 7.1 10*3/uL (ref 4.0–10.5)
nRBC: 0 % (ref 0.0–0.2)

## 2021-02-28 MED ORDER — DIPHENHYDRAMINE HCL 25 MG PO CAPS
25.0000 mg | ORAL_CAPSULE | Freq: Once | ORAL | Status: AC
  Administered 2021-02-28: 25 mg via ORAL
  Filled 2021-02-28: qty 1

## 2021-02-28 MED ORDER — IBUPROFEN 200 MG PO TABS
400.0000 mg | ORAL_TABLET | Freq: Four times a day (QID) | ORAL | Status: DC
  Administered 2021-02-28 – 2021-03-03 (×13): 400 mg via ORAL
  Filled 2021-02-28 (×13): qty 2

## 2021-02-28 MED ORDER — CLONIDINE HCL 0.1 MG PO TABS
0.1000 mg | ORAL_TABLET | Freq: Two times a day (BID) | ORAL | Status: DC
  Administered 2021-02-28: 0.1 mg via ORAL
  Filled 2021-02-28 (×2): qty 1

## 2021-02-28 NOTE — H&P (Incomplete)
Physical Medicine and Rehabilitation Admission H&P    Chief Complaint  Patient presents with  . Weakness  : HPI: Chad Coleman is a 60 year old right-handed male with history of hypertension as well as tobacco/polysubstance abuse and medical noncompliance.  Patient lives alone independent prior to admission in a boarding home.  He has a sister as well as a roommate who can provide support on discharge.  Presented 02/21/2021 with left-sided weakness and facial droop.  Cranial CT scan showed approximately 5.2 cm acute intraparenchymal hemorrhage in the lateral right basal ganglia.  Mass-effect resulting in effacement of right lateral ventricle and slight 2 mm leftward midline shift.  CT angiogram of head and neck showed common carotid, internal carotid and vertebral arteries being patent within the neck mild stenosis.  There was a 7.4 x 7.4 x 8.5 cm lobulated low-attenuation lesion within the mid to lower left neck.  Findings suggestive of lymphovascular malformation.  MRI follow-up again noted acute parenchymal hemorrhage right basal ganglia slightly increased in size since head CT performed now measuring 5.5 x 3.1 cm in transaxial dimensions.  Echocardiogram with ejection fraction of 65 to 70% no wall motion abnormality grade 1 diastolic dysfunction.  Admission chemistries unremarkable except glucose 118 hemoglobin 13.7 alcohol negative, urine drug screen positive cocaine.  Placed on Cleviprex for blood pressure control.  Findings of elevated hemoglobin A1c 6.4 placed on Glucophage.  Hospital course complicated by dysphagia currently on a dysphagia #2 thin liquid diet.  He was cleared to begin Lovenox for DVT prophylaxis 02/23/2021.  Therapy evaluations completed due to patient's left side weakness he was admitted for a comprehensive rehab program.  Review of Systems  Constitutional: Negative for chills and fever.  HENT: Negative for hearing loss.   Eyes: Negative for blurred vision and double  vision.  Respiratory: Negative for cough and shortness of breath.   Cardiovascular: Negative for chest pain, palpitations and leg swelling.  Gastrointestinal: Positive for constipation. Negative for heartburn, nausea and vomiting.  Genitourinary: Negative for dysuria, flank pain and hematuria.  Musculoskeletal: Positive for joint pain and myalgias.  Skin: Negative for rash.  Neurological: Positive for dizziness, speech change, focal weakness and headaches.  All other systems reviewed and are negative.  Past Medical History:  Diagnosis Date  . Hypertension    No past surgical history on file. No family history on file. Social History:  reports that he has been smoking. He has never used smokeless tobacco. No history on file for alcohol use and drug use. Allergies: No Known Allergies Medications Prior to Admission  Medication Sig Dispense Refill  . amLODipine (NORVASC) 5 MG tablet Take 1 tablet (5 mg total) by mouth daily. 30 tablet 1  . amLODipine (NORVASC) 5 MG tablet TAKE 1 TABLET (5 MG TOTAL) BY MOUTH DAILY. 30 tablet 1  . amLODipine (NORVASC) 5 MG tablet TAKE 1 TABLET (5 MG TOTAL) BY MOUTH DAILY. 30 tablet 0  . hydrochlorothiazide (HYDRODIURIL) 25 MG tablet Take 1 tablet (25 mg total) by mouth daily. (Patient not taking: No sig reported) 30 tablet 1    Drug Regimen Review Drug regimen was reviewed and remains appropriate with no significant issues identified  Home: Home Living Family/patient expects to be discharged to:: Group home (Boarding house) Living Arrangements: Alone Type of Home: House Home Access: Stairs to enter Technical brewer of Steps: 6-7 Bathroom Shower/Tub: Chiropodist: Standard Additional Comments: Roommates  Lives With: Other (Comment) (roommate)   Functional History: Prior Function Level of Independence:  Independent Comments: pt reports he likes to walk outdoors  Functional Status:  Mobility: Bed Mobility Overal bed  mobility: Needs Assistance Bed Mobility: Supine to Sit,Sit to Supine Supine to sit: Mod assist,HOB elevated Sit to supine: Mod assist General bed mobility comments: Cued pt to hook R foot under L leg to assist off EOB, with modA to complete trunk ascension and leg management. ModA to manage trunk and legs back into bed end of session. Transfers Overall transfer level: Needs assistance Equipment used: 2 person hand held assist Transfers: Sit to/from Stand Sit to Stand: Mod assist,+2 physical assistance,+2 safety/equipment Stand pivot transfers: Mod assist,Max assist,+2 physical assistance,+2 safety/equipment General transfer comment: ModAx2 with bil knee block, esp on L, and physical assistance under buttocks to extend hips to come to full stand. Blockage of R foot to prevent lateral placement for pushing. Ambulation/Gait General Gait Details: Pre-gait training, cuing pt to shift weight laterally, esp to R, with bil knees blocked with L knee buckling on occasion, and mod-maxAx2 for steadying and to prevent L lean/push. Allowed L knee to flex intermittently with cues to extend in response to facilitate quads contraction for improved knee stability in standing, mod success.    ADL: ADL Overall ADL's : Needs assistance/impaired Eating/Feeding: NPO Grooming: Maximal assistance,Minimal assistance,Sitting Grooming Details (indicate cue type and reason): Max A for sitting balance. Min cues for sequencing and completing task fully. Pt with tendency to only wipe R eye. Functional mobility during ADLs: Moderate assistance,+2 for physical assistance (sit<>stand only) General ADL Comments: Pt performing sitting balance activity, grooming, and sit <>stand  Cognition: Cognition Overall Cognitive Status: Impaired/Different from baseline Arousal/Alertness: Awake/alert Orientation Level: Oriented X4 Attention: Sustained,Selective Sustained Attention: Appears intact Selective Attention:  Impaired Selective Attention Impairment: Verbal basic Memory: Appears intact Awareness: Impaired Awareness Impairment: Emergent impairment Problem Solving: Impaired Problem Solving Impairment: Functional basic,Verbal basic Behaviors: Impulsive Safety/Judgment: Impaired Cognition Arousal/Alertness: Awake/alert (periods of drowsiness increasing as session progressed) Behavior During Therapy: Flat affect Overall Cognitive Status: Impaired/Different from baseline Area of Impairment: Memory,Attention,Following commands,Safety/judgement,Problem solving,Awareness Current Attention Level: Sustained Memory: Decreased short-term memory,Decreased recall of precautions Following Commands: Follows one step commands with increased time,Follows one step commands consistently,Follows multi-step commands inconsistently Safety/Judgement: Decreased awareness of safety,Decreased awareness of deficits Awareness: Intellectual Problem Solving: Slow processing,Decreased initiation,Requires verbal cues,Requires tactile cues,Difficulty sequencing General Comments: Pt with poor awareness of midline and safe sitting EOB, contraversive pushing noted towards L with little awareness of this despite repeated multi-modal cues to attend to it and correct. Pt will push himself all the way to the L without reactional strategies to stop him from landing his L shoulder on the bed surface. Pt with poor attention span. Pt follows most mobility commands, at times requires multimodal cuing to complete. Pt with periods of eye closing during session sitting EOB, especially as pt fatigued with session. L inattention, attends with max visual and verbal cuing and extra time.  Physical Exam: Blood pressure (!) 147/96, pulse (!) 58, temperature 98.9 F (37.2 C), temperature source Oral, resp. rate 18, height 5\' 9"  (1.753 m), weight 73.2 kg, SpO2 99 %. Physical Exam Neurological:     Comments: Patient is alert in no acute distress.   Oriented to person and place.  He does have some left neglect.  Follows commands.     Results for orders placed or performed during the hospital encounter of 02/21/21 (from the past 48 hour(s))  Glucose, capillary     Status: Abnormal   Collection Time: 02/26/21 12:58 PM  Result Value Ref Range   Glucose-Capillary 118 (H) 70 - 99 mg/dL    Comment: Glucose reference range applies only to samples taken after fasting for at least 8 hours.   Comment 1 Notify RN    Comment 2 Document in Chart   Glucose, capillary     Status: Abnormal   Collection Time: 02/26/21  4:27 PM  Result Value Ref Range   Glucose-Capillary 126 (H) 70 - 99 mg/dL    Comment: Glucose reference range applies only to samples taken after fasting for at least 8 hours.   Comment 1 Notify RN    Comment 2 Document in Chart   Glucose, capillary     Status: Abnormal   Collection Time: 02/26/21  8:09 PM  Result Value Ref Range   Glucose-Capillary 107 (H) 70 - 99 mg/dL    Comment: Glucose reference range applies only to samples taken after fasting for at least 8 hours.   Comment 1 Notify RN    Comment 2 Document in Chart   Glucose, capillary     Status: Abnormal   Collection Time: 02/27/21 12:22 AM  Result Value Ref Range   Glucose-Capillary 118 (H) 70 - 99 mg/dL    Comment: Glucose reference range applies only to samples taken after fasting for at least 8 hours.   Comment 1 Notify RN    Comment 2 Document in Chart   CBC     Status: None   Collection Time: 02/27/21  1:27 AM  Result Value Ref Range   WBC 6.6 4.0 - 10.5 K/uL   RBC 5.32 4.22 - 5.81 MIL/uL   Hemoglobin 15.2 13.0 - 17.0 g/dL   HCT 45.8 39.0 - 52.0 %   MCV 86.1 80.0 - 100.0 fL   MCH 28.6 26.0 - 34.0 pg   MCHC 33.2 30.0 - 36.0 g/dL   RDW 12.2 11.5 - 15.5 %   Platelets 227 150 - 400 K/uL   nRBC 0.0 0.0 - 0.2 %    Comment: Performed at [facility], [place] [address], [place], [place] [address]  Comprehensive metabolic panel     Status: Abnormal    Collection Time: 02/27/21  1:27 AM  Result Value Ref Range   Sodium 131 (L) 135 - 145 mmol/L   Potassium 4.3 3.5 - 5.1 mmol/L    Comment: NO VISIBLE HEMOLYSIS   Chloride 98 98 - 111 mmol/L   CO2 26 22 - 32 mmol/L   Glucose, Bld 116 (H) 70 - 99 mg/dL    Comment: Glucose reference range applies only to samples taken after fasting for at least 8 hours.   BUN 21 (H) 6 - 20 mg/dL   Creatinine, Ser 1.25 (H) 0.61 - 1.24 mg/dL   Calcium 8.7 (L) 8.9 - 10.3 mg/dL   Total Protein 6.4 (L) 6.5 - 8.1 g/dL   Albumin 3.3 (L) 3.5 - 5.0 g/dL   AST 51 (H) 15 - 41 U/L   ALT 56 (H) 0 - 44 U/L   Alkaline Phosphatase 40 38 - 126 U/L   Total Bilirubin 1.1 0.3 - 1.2 mg/dL   GFR, Estimated >60 >60 mL/min    Comment: (NOTE) Calculated using the CKD-EPI Creatinine Equation (2021)    Anion gap 7 5 - 15    Comment: Performed at [facility], [place] [address] [place], [place] [address]  Magnesium     Status: Abnormal   Collection Time: 02/27/21  1:27 AM  Result Value Ref  Range   Magnesium 2.5 (H) 1.7 - 2.4 mg/dL    Comment: Performed at [facility] [address], [place], [place] [address]  Glucose, capillary     Status: Abnormal   Collection Time: 02/27/21  4:15 AM  Result Value Ref Range   Glucose-Capillary 135 (H) 70 - 99 mg/dL    Comment: Glucose reference range applies only to samples taken after fasting for at least 8 hours.   Comment 1 Notify RN    Comment 2 Document in Chart   Glucose, capillary     Status: Abnormal   Collection Time: 02/27/21  8:10 AM  Result Value Ref Range   Glucose-Capillary 132 (H) 70 - 99 mg/dL    Comment: Glucose reference range applies only to samples taken after fasting for at least 8 hours.  Glucose, capillary     Status: Abnormal   Collection Time: 02/27/21 11:03 AM  Result Value Ref Range   Glucose-Capillary 118 (H) 70 - 99 mg/dL    Comment: Glucose reference range applies only to samples taken after fasting for at least 8 hours.  Glucose, capillary      Status: Abnormal   Collection Time: 02/27/21  3:13 PM  Result Value Ref Range   Glucose-Capillary 122 (H) 70 - 99 mg/dL    Comment: Glucose reference range applies only to samples taken after fasting for at least 8 hours.  Glucose, capillary     Status: Abnormal   Collection Time: 02/27/21  8:27 PM  Result Value Ref Range   Glucose-Capillary 135 (H) 70 - 99 mg/dL    Comment: Glucose reference range applies only to samples taken after fasting for at least 8 hours.   Comment 1 Notify RN    Comment 2 Document in Chart   Glucose, capillary     Status: Abnormal   Collection Time: 02/28/21 12:10 AM  Result Value Ref Range   Glucose-Capillary 141 (H) 70 - 99 mg/dL    Comment: Glucose reference range applies only to samples taken after fasting for at least 8 hours.   Comment 1 Notify RN    Comment 2 Document in Chart   Glucose, capillary     Status: Abnormal   Collection Time: 02/28/21  4:11 AM  Result Value Ref Range   Glucose-Capillary 112 (H) 70 - 99 mg/dL    Comment: Glucose reference range applies only to samples taken after fasting for at least 8 hours.   Comment 1 Notify RN    Comment 2 Document in Chart   CBC     Status: None   Collection Time: 02/28/21  5:05 AM  Result Value Ref Range   WBC 7.1 4.0 - 10.5 K/uL   RBC 4.89 4.22 - 5.81 MIL/uL   Hemoglobin 14.2 13.0 - 17.0 g/dL   HCT 43.2 39.0 - 52.0 %   MCV 88.3 80.0 - 100.0 fL   MCH 29.0 26.0 - 34.0 pg   MCHC 32.9 30.0 - 36.0 g/dL   RDW 12.2 11.5 - 15.5 %   Platelets 300 150 - 400 K/uL   nRBC 0.0 0.0 - 0.2 %    Comment: Performed at [facility], [place] [address], [place], [place] [address]  Renal function panel     Status: Abnormal   Collection Time: 02/28/21  5:05 AM  Result Value Ref Range   Sodium 134 (L) 135 - 145 mmol/L   Potassium 4.2 3.5 - 5.1 mmol/L   Chloride 101 98 -  111 mmol/L   CO2 26 22 - 32 mmol/L   Glucose, Bld 118 (H) 70 - 99 mg/dL    Comment: Glucose reference range applies only to samples taken  after fasting for at least 8 hours.   BUN 16 6 - 20 mg/dL   Creatinine, Ser 1.09 0.61 - 1.24 mg/dL   Calcium 8.7 (L) 8.9 - 10.3 mg/dL   Phosphorus 2.7 2.5 - 4.6 mg/dL   Albumin 2.9 (L) 3.5 - 5.0 g/dL   GFR, Estimated >60 >60 mL/min    Comment: (NOTE) Calculated using the CKD-EPI Creatinine Equation (2021)    Anion gap 7 5 - 15    Comment: Performed at [facility] [address] [place], [place] [address]  Glucose, capillary     Status: Abnormal   Collection Time: 02/28/21  7:54 AM  Result Value Ref Range   Glucose-Capillary 131 (H) 70 - 99 mg/dL    Comment: Glucose reference range applies only to samples taken after fasting for at least 8 hours.   Comment 1 Notify RN    Comment 2 Document in Chart    No results found.     Medical Problem List and Plan: 1.  Left-sided weakness and facial droop secondary to right basal ganglia ICH secondary to uncontrolled hypertension and cocaine use  -patient may *** shower  -ELOS/Goals: *** 2.  Antithrombotics: -DVT/anticoagulation: Lovenox  -antiplatelet therapy: N/A 3. Pain Management: Tylenol 3 as needed headaches 4. Mood: Provide emotional support  -antipsychotic agents: N/A 5. Neuropsych: This patient is capable of making decisions on his own behalf. 6. Skin/Wound Care: Routine skin checks 7. Fluids/Electrolytes/Nutrition: Routine in and outs with follow-up chemistries 8.  Hypertension.  Norvasc 10 mg daily, clonidine 0.3 mg twice daily, lisinopril 5 mg daily.  Monitor with increased mobility 9.  Hyperlipidemia.  Lipitor 10.  New onset diabetes mellitus.  Hemoglobin A1c 6.4.  Continue Glucophage 500 mg daily 11.  History of tobacco polysubstance abuse.  Urine drug screen positive cocaine.  Provide counseling 12.  Medical noncompliance.  Counseling ***  Cathlyn Parsons, PA-C 02/28/2021

## 2021-02-28 NOTE — Progress Notes (Signed)
PROGRESS NOTE  Chad Coleman  XVQ:[id number] DOB: 15-Jul-1961 DOA: 02/21/2021 PCP: Patient, No Pcp Per (Inactive)  Brief Narrative:   60 year old male HTN, tobacco, cocaine-noncompliant on HCTZ Admit 3/28-code stroke NIHSS 13 intracranial hemorrhage right basal ganglia 2/2 HTN/cocaine--UDS on admission positive for cocaine Deficits =-visual field cut left sensory deficit right gaze preference left-sided weakness  3/28 admit-MRI brain 5.5X 3.1 cm hemorrhage + edema 3 mm leftward shift 3/29 LVEF 65-70% grade 1 DD-LDL 52 4/1 dysphagia 2 diet 4/2 transferred to hospitalist service   developed interim hyponatremia 2/2 HCTZ, increasing free water so med adjustments performed Has been seen and recommended for CIR by therapy services He has a roommate his sister lives in [place] so disposition planning at this time-both CIR as well as social work are aware of need for planning   Hospital-Problem based course  5 x 3 intracranial hemorrhage with 3 mm leftward shift Long recovery expected-discussed in detail with the sister on 4/2 long road to recovery Secondary prevention measures = stop smoking/polysubstances, atorvastatin increased to high intensity dosing 80 mg Strict blood pressure control-please see below Hypernatremia hypovolemic/reset osmole stat Urine osmolality/sodium are both low Continue fluid restriction-sodium improved with the discontinuation of thiazide Periodic lab checks Accidental fall Patient fell out of bed grazed left forearm-area visualized Vaseline gauze if possible Patient is stable at this time and has good range of motion to upper arm and lower arm-no requirement for imaging at this time Risk of azotemia without AKI Allow supplements with solute to prevent volume depletion-see above discussion Liberalize fluid intake to allow for more volume Offer fluids New onset DM A1c  6.4 Cont SSI Adding Metformin 500 daily HTN Uncontrolled on admission continue  above meds but scale back clonidine to 0.1 mg dosing twice daily DC the clonidine patch if not done already-amlodipine 10, lisinopril 5 Continue K. Dur 40 twice daily Daily magnesium Transaminitis likely related to chronic EtOH? Labs done today-no baseline-indicate the same Follow trend tomorrow   DVT prophylaxis: lovenox Code Status: Full Family Communication: [name] discussed [phone] and updated fully on telephone Disposition will be difficult-patient's family and next of kin is in [place]   Disposition:  Status is: Inpatient  Remains inpatient appropriate because:Hemodynamically unstable, Altered mental status, IV treatments appropriate due to intensity of illness or inability to take PO and Inpatient level of care appropriate due to severity of illness   Dispo: The patient is from: Home              Anticipated d/c is to: SNF-hopefully soon ? SNF in [place]              Patient currently is not medically stable to d/c.   Difficult to place patient No    Consultants:   Assumed care from CCM  Procedures:   Antimicrobials:     Subjective:  Seen earlier today with therapy I was on the floor and nursing reported assisted fall of the patient and I evaluated the patient immediately Mild bruise over extensor aspect of left forearm Otherwise he is a little bit more sleepy than he was yesterday  Objective: Vitals:   02/28/21 0901 02/28/21 1023 02/28/21 1200 02/28/21 1403  BP: (!) 147/96 130/83 92/74 106/72  Pulse: (!) 58  61 66  Resp: 18  17 20   Temp: 98.9 F (37.2 C)  98 F (36.7 C) 98.2 F (36.8 C)  TempSrc: Oral  Oral Oral  SpO2: 99%  98% 100%  Weight:  Height:        Intake/Output Summary (Last 24 hours) at 02/28/2021 1632 Last data filed at 02/28/2021 0930 Gross per 24 hour  Intake 886 ml  Output 500 ml  Net 386 ml   Filed Weights   02/25/21 1357  Weight: 73.2 kg    Examination:  Dense plegia on the left side remains New  bruise about 5X 4 cm CTA B no added sound rales rhonchi Slightly sleepy EOMI NCAT   Data Reviewed: personally reviewed  Data Sodium 126-->127-->131-->134 Potassium 3.0-->4.3-->4.2 BUNs/creatinine up from 11/1.12--> 21/1.2-->16/1.09  Hemoglobin 15.3, WBC 7.1  CBG trend = 112--141   Radiology Studies: No results found.   Scheduled Meds: . amLODipine  10 mg Oral Daily  . atorvastatin  20 mg Oral Daily  . Chlorhexidine Gluconate Cloth  6 each Topical Q0600  . cloNIDine  0.3 mg Oral BID  . enoxaparin (LOVENOX) injection  40 mg Subcutaneous Q24H  . feeding supplement  1 Container Oral TID BM  . ibuprofen  400 mg Oral QID  . influenza vac split quadrivalent PF  0.5 mL Intramuscular Tomorrow-1000  . insulin aspart  0-9 Units Subcutaneous Q4H  . lisinopril  5 mg Oral Daily  . metFORMIN  500 mg Oral Q breakfast  . pantoprazole  40 mg Oral QHS  . polyethylene glycol  17 g Oral Daily  . senna-docusate  1 tablet Oral BID   Continuous Infusions:   LOS: 7 days   Time spent: 15  Nita Sells, MD [facility] To contact the attending provider between 7A-7P or the covering provider during after hours 7P-7A, please log into the web site [email or web address] and access using universal [place] password for that web site. If you do not have the password, please call the hospital operator.  02/28/2021, 4:32 PM

## 2021-02-28 NOTE — Progress Notes (Signed)
Inpatient Rehab Admissions:  Inpatient Rehab Consult received.  I met with patient at the bedside, and spoke to his sister, Kathy, over the phone for rehabilitation assessment and to discuss goals and expectations of an inpatient rehab admission.  Per Kathy, pt would not have someone who could provide the expected level of support needed following a CIR stay (pt would have supervision to min assist level goals after estimated length of stay 14-17 days).  Kathy works full time, and their mother is disabled and could not provide the level of support needed.  They are not in a position to be able to hire caregivers to cover the gaps in coverage.  I am unable to offer the pt a bed at this time, and will sign off for CIR.    Signed: Caitlin Warren, PT, DPT Admissions Coordinator [phone] 02/28/21  11:05 AM    

## 2021-02-28 NOTE — Progress Notes (Signed)
Physical Therapy Treatment Patient Details Name: Chad Coleman MRN: [id number] DOB: [birth year] Today's Date: 02/28/2021    History of Present Illness 60 yo male presents to [facility] on 3/28 with acute onset of L weakness, numbness, and L facial droop. CTH R BG ICH, suspect hypertensive. PMH includes hypertension, tobacco use, noncompliance with antihypertensive medications, and cocaine abuse.    PT Comments    Pt quite lethargic during session and less verbally interactive however is improving from the standpoint of decreased pushing with LUE throughout session. Pt able to maintain sitting EOB with min A with L hand in lap.; Pt stood EOB with +2 mod A for power up and control of L knee. Pivot to recliner with max A +2 with scissoring of LLE. PT will continue to follow.    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, awaiting further imaging; pusher to L; L neglect; posey belt Restrictions Weight Bearing Restrictions: No    Mobility  Bed Mobility Overal bed mobility: Needs Assistance Bed Mobility: Supine to Sit     Supine to sit: Mod assist;HOB elevated     General bed mobility comments: mod A for LE's off EOB, pt able to grasp L rail with R hand to pull during elevation of trunk but then needed manual facilitation to let go with R hand to wt shift to R and achieve upright sitting    Transfers Overall transfer level: Needs assistance Equipment used: 2 person hand held assist Transfers: Sit to/from [facility] Sit to Stand: Mod assist;+2 physical assistance;+2 safety/equipment Stand pivot transfers: Max assist;+2 physical assistance;+2 safety/equipment       General transfer comment: mod A +2 for power up into  standing and to counteract L lean. With pivot, pt's L LE sliding behind RLE, max A +2 to counteract to get to chair  Ambulation/Gait                 Stairs             Wheelchair Mobility    Modified Rankin (Stroke Patients Only) Modified Rankin (Stroke Patients Only) Pre-Morbid Rankin Score: No symptoms Modified Rankin: Severe disability     Balance Overall balance assessment: Needs assistance Sitting-balance support: Single extremity supported;Feet supported Sitting balance-Leahy Scale: Poor Sitting balance - Comments: with pt's R hand in lap was able to sit EOB with min A, unable to accept challenge. Maintained cervical flexion unless cued to look fwd. Postural control: Left lateral lean Standing balance support: Bilateral upper extremity supported Standing balance-Leahy Scale: Poor Standing balance comment: L knee blocked and maxAx2 for balance                            Cognition Arousal/Alertness: Lethargic;Suspect due to medications Behavior During Therapy: Flat affect Overall Cognitive Status: Impaired/Different from baseline Area of Impairment: Memory;Attention;Following commands;Safety/judgement;Problem solving;Awareness                   Current Attention Level: Sustained Memory: Decreased short-term memory;Decreased recall of precautions Following Commands: Follows one step commands with increased time;Follows one step commands consistently;Follows multi-step commands inconsistently Safety/Judgement: Decreased awareness of safety;Decreased awareness of deficits Awareness: Intellectual Problem Solving: Slow processing;Decreased initiation;Requires verbal cues;Requires tactile cues;Difficulty sequencing General  Comments: moves R side when instructed to move L, improves with vc's. Increasing fatigue throughout session. Pt with minimal verbalization today, seems somewhat depressed of mood      Exercises      General Comments  General comments (skin integrity, edema, etc.): Dr Verlon Au present for transfer. Pt lethargic today, received tylenol 3 this AM for pain. VSS. Assisted pt with initiation of eating lunch. Pt first picked up pureed mac n cheese with his R hand and began eating. Obtained red foam for fork handle to make holding easier.      Pertinent Vitals/Pain Pain Assessment: Faces Faces Pain Scale: Hurts little more Pain Location: L neck Pain Descriptors / Indicators: Guarding;Grimacing Pain Intervention(s): Limited activity within patient's tolerance;Monitored during session;Premedicated before session    Home Living                      Prior Function            PT Goals (current goals can now be found in the care plan section) Acute Rehab PT Goals Patient Stated Goal: none stated PT Goal Formulation: With patient Time For Goal Achievement: 03/08/21 Potential to Achieve Goals: Good Progress towards PT goals: Progressing toward goals    Frequency    Min 4X/week      PT Plan Current plan remains appropriate    Co-evaluation              AM-PAC PT "6 Clicks" Mobility   Outcome Measure  Help needed turning from your back to your side while in a flat bed without using bedrails?: A Lot Help needed moving from lying on your back to sitting on the side of a flat bed without using bedrails?: A Lot Help needed moving to and from a bed to a chair (including a wheelchair)?: A Lot Help needed standing up from a chair using your arms (e.g., wheelchair or bedside chair)?: A Lot Help needed to walk in hospital room?: Total Help needed climbing 3-5 steps with a railing? : Total 6 Click Score: 10    End of Session Equipment Utilized During Treatment: Gait belt Activity Tolerance: Patient limited by fatigue Patient left: with call bell/phone within reach;in bed;with restraints reapplied;with chair alarm set Nurse Communication: Mobility status;Patient requests pain meds PT Visit  Diagnosis: Hemiplegia and hemiparesis;Unsteadiness on feet (R26.81);Muscle weakness (generalized) (M62.81);Difficulty in walking, not elsewhere classified (R26.2);Other symptoms and signs involving the nervous system (R29.898) Hemiplegia - Right/Left: Left Hemiplegia - dominant/non-dominant: Dominant Hemiplegia - caused by: Nontraumatic intracerebral hemorrhage     Time: 1204-1227 PT Time Calculation (min) (ACUTE ONLY): 23 min  Charges:  $Therapeutic Activity: 23-37 mins                     [place]  Pager [phone] Office [place] 02/28/2021, 1:51 PM

## 2021-02-28 NOTE — TOC Initial Note (Signed)
Transition of Care [facility]) - Initial/Assessment Note    Patient Details  Name: Chad Coleman MRN: [id number] Date of Birth: 01-18-1961  [facility] CM/SW Contact:    Geralynn Ochs, LCSW Phone Number: 02/28/2021, 11:50 AM  Clinical Narrative:     CSW notified by [facility] Admissions that patient is not appropriate due to lack of caregiver support. Patient has no insurance, will need to determine if patient is appropriate for LOG placement. CSW sent message to [place] with financial counseling to screen for Medicaid and Disability eligibility to pursue LOG SNF placement. Sister Chad Coleman in [place], would appreciate seeing if any SNF in [place] would take the patient. CSW to follow.             Expected Discharge Plan: Skilled Nursing Facility Barriers to Discharge: Continued Medical Work up,Inadequate or no insurance,Active Substance Use - Placement   Patient Goals and CMS Choice Patient states their goals for this hospitalization and ongoing recovery are:: get better CMS Medicare.gov Compare Post Acute Care list provided to:: Patient Choice offered to / list presented to : Patient  Expected Discharge Plan and Services Expected Discharge Plan: [place] arrangements for the past 2 months: [place]                                      Prior Living Arrangements/Services Living arrangements for the past 2 months: [place] with:: Self Patient language and need for interpreter reviewed:: No Do you feel safe going back to the place where you live?: Yes      Need for Family Participation in Patient Care: No (Comment) Care giver support system in place?: No (comment)   Criminal Activity/Legal Involvement Pertinent to Current Situation/Hospitalization: No - Comment as needed  Activities of Daily Living Home Assistive Devices/Equipment: None ADL Screening (condition at time of admission) Patient's cognitive  ability adequate to safely complete daily activities?: Yes Is the patient deaf or have difficulty hearing?: No Does the patient have difficulty seeing, even when wearing glasses/contacts?: No Does the patient have difficulty concentrating, remembering, or making decisions?: Yes Patient able to express need for assistance with ADLs?: No Does the patient have difficulty dressing or bathing?: Yes Independently performs ADLs?: No Does the patient have difficulty walking or climbing stairs?: Yes Weakness of Legs: Left Weakness of Arms/Hands: Left  Permission Sought/Granted Permission sought to share information with : Facility Retail banker granted to share information with : Yes, Verbal Permission Granted  Share Information with NAME: Chad Coleman  Permission granted to share info w AGENCY: SNF  Permission granted to share info w Relationship: Sister     Emotional Assessment Appearance:: Appears stated age Attitude/Demeanor/Rapport: Engaged Affect (typically observed): Appropriate Orientation: : Oriented to Self,Oriented to Place,Oriented to  Time,Oriented to Situation Alcohol / Substance Use: Illicit Drugs Psych Involvement: No (comment)  Admission diagnosis:  ICH (intracerebral hemorrhage) ([place]) [I61.9] Intraparenchymal hemorrhage of brain ([place]) [I61.9] Hypertension, unspecified type [I10] Patient Active Problem List   Diagnosis Date Noted  . Essential hypertension   . Tobacco abuse   . Hyponatremia   . Leukocytosis   . Controlled type 2 diabetes mellitus with hyperglycemia, without long-term current use of insulin ([place])   . ICH (intracerebral hemorrhage) ([place]) 02/21/2021   PCP:  Patient, No Pcp Per (Inactive) Pharmacy:   [place], [place] -  [place] [address] Phone: [phone] Fax: [place], [place] [address] [place] [address] Phone: [phone] Fax: [phone]     Social Determinants of Health (SDOH) Interventions    Readmission Risk Interventions No flowsheet data found.

## 2021-02-28 NOTE — Progress Notes (Signed)
Pt was found on the floor at the bottom of the chair. Pt remains A&O x4, Pt does have skin tear at left elbow. MD made aware and advised for Vaseline/Vaseline guaze at elbow. Pt reports no new pain related to fall. Pt now in bed with call light and telephone at hand. RN will continue to monitor

## 2021-03-01 LAB — GLUCOSE, CAPILLARY
Glucose-Capillary: 109 mg/dL — ABNORMAL HIGH (ref 70–99)
Glucose-Capillary: 122 mg/dL — ABNORMAL HIGH (ref 70–99)
Glucose-Capillary: 126 mg/dL — ABNORMAL HIGH (ref 70–99)
Glucose-Capillary: 140 mg/dL — ABNORMAL HIGH (ref 70–99)
Glucose-Capillary: 147 mg/dL — ABNORMAL HIGH (ref 70–99)
Glucose-Capillary: 156 mg/dL — ABNORMAL HIGH (ref 70–99)
Glucose-Capillary: 312 mg/dL — ABNORMAL HIGH (ref 70–99)
Glucose-Capillary: 66 mg/dL — ABNORMAL LOW (ref 70–99)

## 2021-03-01 LAB — CBC
HCT: 42.6 % (ref 39.0–52.0)
Hemoglobin: 14.3 g/dL (ref 13.0–17.0)
MCH: 29.1 pg (ref 26.0–34.0)
MCHC: 33.6 g/dL (ref 30.0–36.0)
MCV: 86.8 fL (ref 80.0–100.0)
Platelets: 326 10*3/uL (ref 150–400)
RBC: 4.91 MIL/uL (ref 4.22–5.81)
RDW: 12 % (ref 11.5–15.5)
WBC: 7.5 10*3/uL (ref 4.0–10.5)
nRBC: 0 % (ref 0.0–0.2)

## 2021-03-01 MED ORDER — CLONIDINE HCL 0.1 MG PO TABS
0.1000 mg | ORAL_TABLET | Freq: Every day | ORAL | Status: DC
  Administered 2021-03-02 – 2021-03-09 (×8): 0.1 mg via ORAL
  Filled 2021-03-01 (×8): qty 1

## 2021-03-01 MED ORDER — HYDROCHLOROTHIAZIDE 25 MG PO TABS: 25.0000 mg | ORAL_TABLET | Freq: Every day | ORAL | Status: DC

## 2021-03-01 MED ORDER — CLONIDINE HCL 0.1 MG PO TABS
0.1000 mg | ORAL_TABLET | ORAL | Status: AC
  Administered 2021-03-01: 0.1 mg via ORAL

## 2021-03-01 MED ORDER — ISOSORBIDE MONONITRATE ER 30 MG PO TB24
15.0000 mg | ORAL_TABLET | Freq: Every day | ORAL | Status: DC
  Administered 2021-03-01 – 2021-03-12 (×12): 15 mg via ORAL
  Filled 2021-03-01 (×13): qty 1

## 2021-03-01 NOTE — Plan of Care (Signed)
  Problem: Coping: Goal: Will verbalize positive feelings about self Outcome: Progressing Goal: Will identify appropriate support needs Outcome: Progressing   Problem: Health Behavior/Discharge Planning: Goal: Ability to manage health-related needs will improve Outcome: Progressing   Problem: Self-Care: Goal: Ability to participate in self-care as condition permits will improve Outcome: Progressing Goal: Verbalization of feelings and concerns over difficulty with self-care will improve Outcome: Progressing Goal: Ability to communicate needs accurately will improve Outcome: Progressing   Problem: Nutrition: Goal: Risk of aspiration will decrease Outcome: Progressing Goal: Dietary intake will improve Outcome: Progressing   Problem: Intracerebral Hemorrhage Tissue Perfusion: Goal: Complications of Intracerebral Hemorrhage will be minimized Outcome: Progressing   Problem: Education: Goal: Knowledge of General Education information will improve Description: Including pain rating scale, medication(s)/side effects and non-pharmacologic comfort measures Outcome: Progressing   Problem: Health Behavior/Discharge Planning: Goal: Ability to manage health-related needs will improve Outcome: Progressing   Problem: Clinical Measurements: Goal: Ability to maintain clinical measurements within normal limits will improve Outcome: Progressing Goal: Will remain free from infection Outcome: Progressing Goal: Diagnostic test results will improve Outcome: Progressing Goal: Respiratory complications will improve Outcome: Progressing Goal: Cardiovascular complication will be avoided Outcome: Progressing   Problem: Activity: Goal: Risk for activity intolerance will decrease Outcome: Progressing   Problem: Nutrition: Goal: Adequate nutrition will be maintained Outcome: Progressing   Problem: Coping: Goal: Level of anxiety will decrease Outcome: Progressing   Problem:  Elimination: Goal: Will not experience complications related to bowel motility Outcome: Progressing Goal: Will not experience complications related to urinary retention Outcome: Progressing   Problem: Pain Managment: Goal: General experience of comfort will improve Outcome: Progressing   Problem: Safety: Goal: Ability to remain free from injury will improve Outcome: Progressing   Problem: Skin Integrity: Goal: Risk for impaired skin integrity will decrease Outcome: Progressing

## 2021-03-01 NOTE — Progress Notes (Signed)
PROGRESS NOTE  Chad Coleman  XIP:[id number] DOB: January 07, 1961 DOA: 02/21/2021 PCP: Patient, No Pcp Per (Inactive)  Brief Narrative:   60 year old male HTN, tobacco, cocaine-noncompliant on HCTZ Admit 3/28-code stroke NIHSS 13 intracranial hemorrhage right basal ganglia 2/2 HTN/cocaine--UDS on admission positive for cocaine Deficits =-visual field cut left sensory deficit right gaze preference left-sided weakness  3/28 admit-MRI brain 5.5X 3.1 cm hemorrhage + edema 3 mm leftward shift 3/29 LVEF 65-70% grade 1 DD-LDL 52 4/1 dysphagia 2 diet 4/2 transferred to hospitalist service   developed interim hyponatremia 2/2 HCTZ, increasing free water so med adjustments performed Has been seen and recommended for CIR by therapy services He has a roommate his sister lives in [place] so disposition planning at this time would likely be skilled facility and expected that his discharge may be delayed  Hospital-Problem based course  5 x 3 intracranial hemorrhage with 3 mm leftward shift Long recovery expected- sister aware long road to recovery Secondary prevention measures = stop smoking/polysubstances, atorvastatin increased to high intensity dosing 80 mg Strict blood pressure control-please see below Hypernatremia hypovolemic/reset osmole stat Urine osmolality/sodium both ?ntinue fluid restriction-sodium ?  With discontinuation of thiazide Periodic lab checks Accidental fall 4/4 pm Patient fell out of bed grazed left forearm-area visualized Vaseline gauze Patient is stable at this time and has good range of motion to upper arm and lower arm-no requirement for imaging at this time Risk of azotemia without AKI Allow supplements with solute to prevent volume depletion-see above discussion Liberalize fluid intake to allow for more volume Offer fluids New onset DM A1c  6.4 Cont SSI Adding Metformin 500 daily HTN Uncontrolled on admission  clonidine to 0.1 mg dosing  daily DC the  clonidine patch if not done already-amlodipine 10, lisinopril 5 Add Imdur 15 Continue K. Dur 40 twice daily Am labs Transaminitis likely related to chronic EtOH? Labs done today-no baseline-indicate the same Follow trend tomorrow   DVT prophylaxis: lovenox Code Status: Full Family Communication: Chad Coleman discussed [phone] and updated fully on telephone on 4/4 Disposition will be difficult-patient's family and next of kin is in [place]  Might be looking at SNF in [place]?  Disposition:  Status is: Inpatient  Remains inpatient appropriate because:Hemodynamically unstable, Altered mental status, IV treatments appropriate due to intensity of illness or inability to take PO and Inpatient level of care appropriate due to severity of illness   Dispo: The patient is from: Home              Anticipated d/c is to: SNF-hopefully soon ? SNF in [place]              Patient currently is not medically stable to d/c.   Difficult to place patient No    Consultants:   Assumed care from CCM  Procedures:    Subjective:  Much more coherent awake No pain in left arm Still dense left-sided deficit States he has some pain in his right shoulder no other issue  Objective: Vitals:   03/01/21 0327 03/01/21 0852 03/01/21 1117 03/01/21 1210  BP: (!) 142/83 (!) 138/97 (!) 176/91 (!) 183/98  Pulse: (!) 54 (!) 54  (!) 56  Resp: 18 20  20   Temp: 98.4 F (36.9 C) 98 F (36.7 C)  98.2 F (36.8 C)  TempSrc: Oral Oral  Oral  SpO2: 100% 99%  100%  Weight:      Height:        Intake/Output Summary (Last 24 hours) at 03/01/2021 1306 Last data  filed at 03/01/2021 5625 Gross per 24 hour  Intake --  Output 500 ml  Net -500 ml   Filed Weights   02/25/21 1357  Weight: 73.2 kg    Examination:  Dense plegia on the left side remains bruise about 5X 4 cm on forearm was evaluated yesterday 4/4 CTA B no added sound rales rhonchi Slightly sleepy EOMI NCAT   Data Reviewed:  personally reviewed  Data  hemoglobin 14 platelet 326 WBC 7.5   Radiology Studies: No results found.   Scheduled Meds: . amLODipine  10 mg Oral Daily  . atorvastatin  20 mg Oral Daily  . [START ON 03/02/2021] cloNIDine  0.1 mg Oral Daily  . enoxaparin (LOVENOX) injection  40 mg Subcutaneous Q24H  . feeding supplement  1 Container Oral TID BM  . ibuprofen  400 mg Oral QID  . influenza vac split quadrivalent PF  0.5 mL Intramuscular Tomorrow-1000  . insulin aspart  0-9 Units Subcutaneous Q4H  . isosorbide mononitrate  15 mg Oral Daily  . lisinopril  5 mg Oral Daily  . metFORMIN  500 mg Oral Q breakfast  . pantoprazole  40 mg Oral QHS  . polyethylene glycol  17 g Oral Daily  . senna-docusate  1 tablet Oral BID   Continuous Infusions:   LOS: 8 days   Time spent: 25  Nita Sells, MD [facility] To contact the attending provider between 7A-7P or the covering provider during after hours 7P-7A, please log into the web site [email or web address] and access using universal [place] password for that web site. If you do not have the password, please call the hospital operator.  03/01/2021, 1:06 PM

## 2021-03-01 NOTE — Plan of Care (Signed)
  Problem: Coping: Goal: Will verbalize positive feelings about self Outcome: Progressing Goal: Will identify appropriate support needs Outcome: Progressing   Problem: Health Behavior/Discharge Planning: Goal: Ability to manage health-related needs will improve Outcome: Progressing   Problem: Self-Care: Goal: Ability to participate in self-care as condition permits will improve Outcome: Progressing Goal: Verbalization of feelings and concerns over difficulty with self-care will improve Outcome: Progressing Goal: Ability to communicate needs accurately will improve Outcome: Progressing   Problem: Nutrition: Goal: Risk of aspiration will decrease Outcome: Progressing Goal: Dietary intake will improve Outcome: Progressing   Problem: Intracerebral Hemorrhage Tissue Perfusion: Goal: Complications of Intracerebral Hemorrhage will be minimized Outcome: Progressing

## 2021-03-01 NOTE — Progress Notes (Signed)
Physical Therapy Treatment Patient Details Name: Chad Coleman MRN: [id number] DOB: [birth year] Today's Date: 03/01/2021    History of Present Illness 60 yo male presents to [facility] on 3/28 with acute onset of L weakness, numbness, and L facial droop. CTH R BG ICH, suspect hypertensive. PMH includes hypertension, tobacco use, noncompliance with antihypertensive medications, and cocaine abuse.    PT Comments    Pt making progress with midline position today with use of mirror in sitting and standing. Pt needs frequent cues to direct attention to mirror but when attending to task pt can self correct posture and maintain sitting with decreased assist (mod A to min A with less pushing to L). Pt stood with tot A and L knee blocked but was then able to maintain standing with max A +2 and perform pregait activities. Pt continues to have significant limitation to awareness and states that he could go home safely today and walk. Pt works very hard and would really benefit from [place] setting. PT will continue to follow.    Follow Up Recommendations  CIR;Supervision/Assistance - 24 hour     Equipment Recommendations  Other (comment) (TBD)    Recommendations for Other Services       Precautions / Restrictions Precautions Precautions: Fall Precaution Comments: Pusher, L neglect, impulsive Restrictions Weight Bearing Restrictions: No    Mobility  Bed Mobility Overal bed mobility: Needs Assistance Bed Mobility: Supine to Sit;Sit to Supine     Supine to sit: Mod assist;HOB elevated Sit to supine: Mod assist;+2 for physical assistance   General bed mobility comments: Pt able to pull self up to EOB with assist with mod HHA using RUE and mod A to RLE to bring to EOB.  Pt more fatigued getting back to supine requiring assist of 2.    Transfers Overall transfer level: Needs assistance Equipment used: 2 person hand held assist Transfers: Sit to/from Stand Sit to Stand: Total assist;+2 physical  assistance         General transfer comment: total assist to power up into standing. Once up with L knee blocked, pt maintained standing with max A x2 and use of mirror for visual positional feedback  Ambulation/Gait             General Gait Details: worked on sliding LLE towards R to achieve normal stance for gait   Chief Strategy Officer    Modified Rankin (Stroke Patients Only) Modified Rankin (Stroke Patients Only) Pre-Morbid Rankin Score: No symptoms Modified Rankin: Severe disability     Balance Overall balance assessment: Needs assistance Sitting-balance support: Single extremity supported;Feet supported Sitting balance-Leahy Scale: Poor Sitting balance - Comments: Pt's balance is dependent on where is R hand is. Pt tends to use RUE to push himself to the Left.  When R hand is isolated, pt maintains balance, at times with min assist to mod assist if distrancted.  At times, pt will need total assist for balance when he uses R UE to push. Postural control: Left lateral lean Standing balance support: Bilateral upper extremity supported Standing balance-Leahy Scale: Poor Standing balance comment: L knee blocked and maxAx2 for balance               High Level Balance Comments: worked on RUE reaching in sitting, when pt reaching to R able to maintain sitting with min A.            Cognition Arousal/Alertness: Awake/alert Behavior During  Therapy: Flat affect Overall Cognitive Status: Impaired/Different from baseline Area of Impairment: Attention;Memory;Following commands;Safety/judgement;Awareness;Problem solving                   Current Attention Level: Sustained Memory: Decreased short-term memory;Decreased recall of precautions Following Commands: Follows one step commands consistently Safety/Judgement: Decreased awareness of safety;Decreased awareness of deficits Awareness: Intellectual Problem Solving: Slow  processing;Decreased initiation;Requires verbal cues;Requires tactile cues;Difficulty sequencing General Comments: Pt with good participation today with use of mirror in front of him to find midline and attempt to address pushing syndrome. Pt states that he could go home today and be safe. He also states that he can walk      Exercises      General Comments General comments (skin integrity, edema, etc.): VSS      Pertinent Vitals/Pain Pain Assessment: Faces Faces Pain Scale: Hurts little more Pain Location: R shoulder Pain Descriptors / Indicators: Grimacing Pain Intervention(s): RN gave pain meds during session;Patient requesting pain meds-RN notified;Monitored during session    Home Living                      Prior Function            PT Goals (current goals can now be found in the care plan section) Acute Rehab PT Goals Patient Stated Goal: go home and he states that he is ready to do that now PT Goal Formulation: With patient Time For Goal Achievement: 03/08/21 Potential to Achieve Goals: Good Progress towards PT goals: Progressing toward goals    Frequency    Min 4X/week      PT Plan Current plan remains appropriate    Co-evaluation PT/OT/SLP Co-Evaluation/Treatment: Yes Reason for Co-Treatment: Complexity of the patient's impairments (multi-system involvement);Necessary to address cognition/behavior during functional activity;For patient/therapist safety PT goals addressed during session: Mobility/safety with mobility;Balance;Strengthening/ROM OT goals addressed during session: ADL's and self-care      AM-PAC PT "6 Clicks" Mobility   Outcome Measure  Help needed turning from your back to your side while in a flat bed without using bedrails?: A Lot Help needed moving from lying on your back to sitting on the side of a flat bed without using bedrails?: A Lot Help needed moving to and from a bed to a chair (including a wheelchair)?: A Lot Help  needed standing up from a chair using your arms (e.g., wheelchair or bedside chair)?: A Lot Help needed to walk in hospital room?: Total Help needed climbing 3-5 steps with a railing? : Total 6 Click Score: 10    End of Session Equipment Utilized During Treatment: Gait belt Activity Tolerance: Patient limited by fatigue Patient left: with call bell/phone within reach;in bed;with bed alarm set Nurse Communication: Mobility status PT Visit Diagnosis: Hemiplegia and hemiparesis;Unsteadiness on feet (R26.81);Muscle weakness (generalized) (M62.81);Difficulty in walking, not elsewhere classified (R26.2);Other symptoms and signs involving the nervous system (R29.898) Hemiplegia - Right/Left: Left Hemiplegia - dominant/non-dominant: Dominant Hemiplegia - caused by: Nontraumatic intracerebral hemorrhage     Time: 5885-0277 PT Time Calculation (min) (ACUTE ONLY): 27 min  Charges:  $Therapeutic Activity: 8-22 mins                     Leighton Roach, [place]  Pager [phone] Office [place] 03/01/2021, 2:05 PM

## 2021-03-01 NOTE — Progress Notes (Signed)
Occupational Therapy Treatment Patient Details Name: Chad Coleman MRN: [id number] DOB: 10/04/1961 Today's Date: 03/01/2021    History of present illness 60 yo male presents to [facility] on 3/28 with acute onset of L weakness, numbness, and L facial droop. CTH R BG ICH, suspect hypertensive. PMH includes hypertension, tobacco use, noncompliance with antihypertensive medications, and cocaine abuse.   OT comments  Pt making slow progress with all adls and adl mobility. Therapy utilized a full length mirror today with pt sitting/ standing at bedside to attempt to increase awareness of midline and erect standing/sitting. Pt pushes self with his RUE toward his left.  When RUE immobilized, pt able to maintain balance at times with mod a x2.  Will continue to see to focus on basic trunk strength, balance and adls.  Pt would benefit from rehab if he has assist at home post rehab.   Follow Up Recommendations  CIR    Equipment Recommendations   (tbd)    Recommendations for Other Services      Precautions / Restrictions Precautions Precautions: Fall Precaution Comments: Pusher, L neglect, impulsive Restrictions Weight Bearing Restrictions: No       Mobility Bed Mobility Overal bed mobility: Needs Assistance Bed Mobility: Supine to Sit     Supine to sit: Mod assist;HOB elevated Sit to supine: Mod assist;+2 for physical assistance   General bed mobility comments: Pt able to pull self up to EOB with assist from one therapist.  Pt more fatigued getting back to supine requiring assist of 2.    Transfers Overall transfer level: Needs assistance Equipment used: 2 person hand held assist Transfers: Sit to/from Stand Sit to Stand: Total assist;+2 physical assistance         General transfer comment: total assist to power up into standing. Once up with L knee blocked, pt has movements of standing with max A x2.    Balance Overall balance assessment: Needs assistance Sitting-balance  support: Single extremity supported;Feet supported Sitting balance-Leahy Scale: Poor Sitting balance - Comments: Pt's balance is dependent on where is R hand is. Pt tends to use RUE to push himself to the Left.  When R hand is isolated, pt maintains balance, at times with min assist to mod assist if distrancted.  At times, pt will need total assist for balance when he uses R UE to push. Postural control: Left lateral lean Standing balance support: Bilateral upper extremity supported Standing balance-Leahy Scale: Poor Standing balance comment: L knee blocked and maxAx2 for balance                           ADL either performed or assessed with clinical judgement   ADL Overall ADL's : Needs assistance/impaired Eating/Feeding: Maximal assistance;Sitting;Cueing for safety Eating/Feeding Details (indicate cue type and reason): Pt impulsive with feeding self and demonstrates swallowing difficulties. Pt is being followed by Speech Therapy. Grooming: Maximal assistance;Sitting Grooming Details (indicate cue type and reason): Pt sat EOB with mod to max assist to maintain. Built up handle and hand over hand assist used to brush teeth with LUE while isolating use of RUE so pt would not use it to push self Left.                 Toilet Transfer: Total assistance;+2 for physical assistance;Stand-pivot;BSC Toilet Transfer Details (indicate cue type and reason): Pt requires L knee to be totally blocked.  Pt with poor midline awareness. Toileting- Clothing Manipulation and Hygiene: Total assistance;Bed level  Functional mobility during ADLs: Moderate assistance;+2 for physical assistance General ADL Comments: Pt groomed at EOB with mod to max asisst to maintain balance. Pt reqiured max assist hand over hand assist use LUE (pt is L handed) to brush teeth.  Pt with significant deficits in areas of balance, perception, neglect, vision and coordination.     Vision   Vision Assessment?:  Vision impaired- to be further tested in functional context Eye Alignment: Impaired (comment)   Perception     Praxis      Cognition Arousal/Alertness: Awake/alert Behavior During Therapy: Flat affect Overall Cognitive Status: Impaired/Different from baseline Area of Impairment: Attention;Memory;Following commands;Safety/judgement;Awareness;Problem solving                   Current Attention Level: Sustained Memory: Decreased short-term memory;Decreased recall of precautions Following Commands: Follows one step commands consistently Safety/Judgement: Decreased awareness of safety;Decreased awareness of deficits Awareness: Intellectual Problem Solving: Slow processing;Decreased initiation;Requires verbal cues;Requires tactile cues;Difficulty sequencing General Comments: Pt with good participation today with use of mirror in front of him to find midline and attempt to address pushing syndrome and change in his midline.        Exercises     Shoulder Instructions       General Comments Pt will need significant rehab due to balance, coordination and cognitive deficits.    Pertinent Vitals/ Pain       Pain Assessment: Faces Faces Pain Scale: Hurts little more Pain Location: R shoulder Pain Descriptors / Indicators: Grimacing Pain Intervention(s): Monitored during session;Patient requesting pain meds-RN notified;RN gave pain meds during session;Repositioned  Home Living                                          Prior Functioning/Environment              Frequency  Min 2X/week        Progress Toward Goals  OT Goals(current goals can now be found in the care plan section)  Progress towards OT goals: Progressing toward goals  Acute Rehab OT Goals Patient Stated Goal: none stated OT Goal Formulation: With patient Time For Goal Achievement: 03/08/21 Potential to Achieve Goals: Good ADL Goals Pt Will Perform Grooming: with min  assist;sitting Pt Will Perform Upper Body Bathing: with min assist;sitting Pt Will Transfer to Toilet: with min assist;with +2 assist;bedside commode;stand pivot transfer Additional ADL Goal #1: Pt will perform bed mobility with Min A in preparation for ADLs Additional ADL Goal #2: Pt will tolerate sitting at EOB for 10 minutes with Min A in preparation for ADLs  Plan Discharge plan remains appropriate    Co-evaluation    PT/OT/SLP Co-Evaluation/Treatment: Yes Reason for Co-Treatment: Complexity of the patient's impairments (multi-system involvement) PT goals addressed during session: Mobility/safety with mobility OT goals addressed during session: ADL's and self-care      AM-PAC OT "6 Clicks" Daily Activity     Outcome Measure   Help from another person eating meals?: A Lot Help from another person taking care of personal grooming?: A Lot Help from another person toileting, which includes using toliet, bedpan, or urinal?: Total Help from another person bathing (including washing, rinsing, drying)?: Total Help from another person to put on and taking off regular upper body clothing?: Total Help from another person to put on and taking off regular lower body clothing?: Total 6 Click Score: 8  End of Session    OT Visit Diagnosis: Unsteadiness on feet (R26.81);Other abnormalities of gait and mobility (R26.89);Muscle weakness (generalized) (M62.81);Hemiplegia and hemiparesis Hemiplegia - Right/Left: Left Hemiplegia - dominant/non-dominant: Dominant Hemiplegia - caused by: Cerebral infarction   Activity Tolerance Patient tolerated treatment well   Patient Left in bed;with call bell/phone within reach;with bed alarm set   Nurse Communication Mobility status        Time: 1660-6301 OT Time Calculation (min): 27 min  Charges: OT General Charges $OT Visit: 1 Visit OT Treatments $Self Care/Home Management : 8-22 mins   Glenford Peers 03/01/2021, 11:52 AM

## 2021-03-02 ENCOUNTER — Inpatient Hospital Stay (HOSPITAL_COMMUNITY): Payer: Medicaid Other

## 2021-03-02 LAB — CBC WITH DIFFERENTIAL/PLATELET
Abs Immature Granulocytes: 0.06 K/uL (ref 0.00–0.07)
Basophils Absolute: 0 K/uL (ref 0.0–0.1)
Basophils Relative: 0 %
Eosinophils Absolute: 0.1 K/uL (ref 0.0–0.5)
Eosinophils Relative: 1 %
HCT: 45.2 % (ref 39.0–52.0)
Hemoglobin: 15 g/dL (ref 13.0–17.0)
Immature Granulocytes: 1 %
Lymphocytes Relative: 9 %
Lymphs Abs: 0.9 K/uL (ref 0.7–4.0)
MCH: 28.5 pg (ref 26.0–34.0)
MCHC: 33.2 g/dL (ref 30.0–36.0)
MCV: 85.8 fL (ref 80.0–100.0)
Monocytes Absolute: 1.4 K/uL — ABNORMAL HIGH (ref 0.1–1.0)
Monocytes Relative: 15 %
Neutro Abs: 7.3 K/uL (ref 1.7–7.7)
Neutrophils Relative %: 74 %
Platelets: 387 K/uL (ref 150–400)
RBC: 5.27 MIL/uL (ref 4.22–5.81)
RDW: 11.9 % (ref 11.5–15.5)
WBC: 9.7 K/uL (ref 4.0–10.5)
nRBC: 0 % (ref 0.0–0.2)

## 2021-03-02 LAB — GLUCOSE, CAPILLARY
Glucose-Capillary: 168 mg/dL — ABNORMAL HIGH (ref 70–99)
Glucose-Capillary: 172 mg/dL — ABNORMAL HIGH (ref 70–99)
Glucose-Capillary: 150 mg/dL — ABNORMAL HIGH (ref 70–99)
Glucose-Capillary: 121 mg/dL — ABNORMAL HIGH (ref 70–99)
Glucose-Capillary: 128 mg/dL — ABNORMAL HIGH (ref 70–99)
Glucose-Capillary: 196 mg/dL — ABNORMAL HIGH (ref 70–99)

## 2021-03-02 LAB — OSMOLALITY: Osmolality: 274 mOsm/kg — ABNORMAL LOW (ref 275–295)

## 2021-03-02 LAB — TSH: TSH: 1.761 u[IU]/mL (ref 0.350–4.500)

## 2021-03-02 LAB — BASIC METABOLIC PANEL
Anion gap: 9 (ref 5–15)
BUN: 13 mg/dL (ref 6–20)
CO2: 25 mmol/L (ref 22–32)
Calcium: 9.1 mg/dL (ref 8.9–10.3)
Chloride: 96 mmol/L — ABNORMAL LOW (ref 98–111)
Creatinine, Ser: 1.02 mg/dL (ref 0.61–1.24)
GFR, Estimated: >60 mL/min (ref >60)
Glucose, Bld: 139 mg/dL — ABNORMAL HIGH (ref 70–99)
Potassium: 3.2 mmol/L — ABNORMAL LOW (ref 3.5–5.1)
Sodium: 130 mmol/L — ABNORMAL LOW (ref 135–145)

## 2021-03-02 MED ORDER — POTASSIUM CHLORIDE CRYS ER 20 MEQ PO TBCR
40.0000 meq | EXTENDED_RELEASE_TABLET | Freq: Once | ORAL | Status: AC
  Administered 2021-03-02: 40 meq via ORAL
  Filled 2021-03-02: qty 2

## 2021-03-02 MED ORDER — LIVING WELL WITH DIABETES BOOK
Freq: Once | Status: AC
  Filled 2021-03-02: qty 1

## 2021-03-02 MED ORDER — METHOCARBAMOL 500 MG PO TABS
500.0000 mg | ORAL_TABLET | Freq: Once | ORAL | Status: AC
  Administered 2021-03-02: 500 mg via ORAL
  Filled 2021-03-02: qty 1

## 2021-03-02 NOTE — Progress Notes (Signed)
Pt continue to pull right bed rail down w/ active arm. Pt not following safety protocol. Pt noted to have fallen this admission. Dr. Hal Hope updated.

## 2021-03-02 NOTE — Progress Notes (Signed)
PROGRESS NOTE    Chad Coleman  LKT:[id number] DOB: September 18, 1961 DOA: 02/21/2021 PCP: Patient, No Pcp Per (Inactive)    Chief Complaint  Patient presents with  . Weakness    Brief Narrative:  60 year old gentleman prior history of hypertension, cocaine use, tobacco abuse admitted on 02/21/2021 with a code stroke was found to have intracranial hemorrhage in the right basal ganglia secondary to hypertension and cocaine use.  Patient was transferred from neurology service to hospitalist service on 02/26/2021. Therapy evaluations recommending SNF. Assessment & Plan:   Active Problems:   ICH (intracerebral hemorrhage) ([place])   Essential hypertension   Tobacco abuse   Hyponatremia   Leukocytosis   Controlled type 2 diabetes mellitus with hyperglycemia, without long-term current use of insulin (HCC)  Intracranial hemorrhage with 1-2 mm leftward shift Optimal Blood pressure control. Echocardiogram showed left ventricle ejection fraction 65 to 70% with grade 1 diastolic dysfunction. LDL is 52. Speech evaluation recommending dysphagia 2 diet.  Hypokalemia Replaced  Hypertension Suboptimally controlled,  added hydralazine 25 mg every 8 hours as needed in addition to Norvasc 10 mg, clonidine 0.1 mg daily, isosorbide 15 mg daily, lisinopril 5 mg daily   Hyper lipidemia Continue with statin.   Type 2 diabetes mellitus uncontrolled with hyperglycemia 6.4 Continue with Metformin. CBG (last 3)  Recent Labs    03/02/21 0346 03/02/21 0805 03/02/21 1234  GLUCAP 168* 172* 150*      Hyponatremia Probably secondary to poor oral intake. Hypovolemic hyponatremia Urine sodium less than 10, evaluate for SIADH.  DC thiazide  Mild abdominal pain: abd film is negative for obstruction.   Mild elevation of liver enzymes.  Will get US abdomen.   Therapy evaluation recommending SNF,    DVT prophylaxis: SCDs Code Status: Full code Family Communication: None at  bedside Disposition:   Status is: Inpatient  Remains inpatient appropriate because:Inpatient level of care appropriate due to severity of illness   Dispo: The patient is from: Home              Anticipated d/c is to: SNF              Patient currently is not medically stable to d/c.   Difficult to place patient Yes       Consultants:   Neurology   Procedures: Echocardiogram  MRI of the brain  Antimicrobials: None  Subjective: Patient denies chest pain or shortness of breath   Objective: Vitals:   03/02/21 0404 03/02/21 0432 03/02/21 0554 03/02/21 0821  BP: (!) 180/97 (!) 169/97 (!) 157/97 (!) 173/92  Pulse: 68 91 75 75  Resp:    20  Temp:    98 F (36.7 C)  TempSrc:    Oral  SpO2:    98%  Weight:      Height:        Intake/Output Summary (Last 24 hours) at 03/02/2021 1135 Last data filed at 03/02/2021 0333 Gross per 24 hour  Intake 480 ml  Output 650 ml  Net -170 ml   Filed Weights   02/25/21 1357  Weight: 73.2 kg    Examination:  General exam: Appears calm and comfortable  Respiratory system: Clear to auscultation. Respiratory effort normal. Cardiovascular system: S1 & S2 heard, RRR. No JVD, . No pedal edema. Gastrointestinal system: Abdomen is nondistended, soft and nontender. Normal bowel sounds heard. Central nervous system: Alert and answering simple questions.  Extremities: no pedal edema, . Skin: No rashes seen  Psychiatry:  Mood is appropriate.  Data Reviewed: I have personally reviewed following labs and imaging studies  CBC: Recent Labs  Lab 02/26/21 0254 02/27/21 0127 02/28/21 0505 03/01/21 0657 03/02/21 0335  WBC 7.3 6.6 7.1 7.5 9.7  NEUTROABS  --   --   --   --  7.3  HGB 15.3 15.2 14.2 14.3 15.0  HCT 43.8 45.8 43.2 42.6 45.2  MCV 84.7 86.1 88.3 86.8 85.8  PLT 300 227 300 326 258    Basic Metabolic Panel: Recent Labs  Lab 02/24/21 1100 02/26/21 0254 02/27/21 0127 02/28/21 0505 03/02/21 0335  NA 126* 127* 131*  134* 130*  K 3.0* 3.1* 4.3 4.2 3.2*  CL 92* 91* 98 101 96*  CO2 24 25 26 26 25   GLUCOSE 185* 114* 116* 118* 139*  BUN 13 20 21* 16 13  CREATININE 1.00 1.21 1.25* 1.09 1.02  CALCIUM 8.5* 8.7* 8.7* 8.7* 9.1  MG  --  2.3 2.5*  --   --   PHOS  --   --   --  2.7  --     GFR: Estimated Creatinine Clearance: 78 mL/min (by C-G formula based on SCr of 1.02 mg/dL).  Liver Function Tests: Recent Labs  Lab 02/26/21 0254 02/27/21 0127 02/28/21 0505  AST 53* 51*  --   ALT 50* 56*  --   ALKPHOS 39 40  --   BILITOT 1.4* 1.1  --   PROT 6.5 6.4*  --   ALBUMIN 3.3* 3.3* 2.9*    CBG: Recent Labs  Lab 03/01/21 1716 03/01/21 2003 03/01/21 2338 03/02/21 0346 03/02/21 0805  GLUCAP 140* 312* 109* 168* 172*     Recent Results (from the past 240 hour(s))  Resp Panel by RT-PCR (Flu A&B, Covid) Nasopharyngeal Swab     Status: None   Collection Time: 02/21/21  7:11 PM   Specimen: Nasopharyngeal Swab; Nasopharyngeal(NP) swabs in vial transport medium  Result Value Ref Range Status   SARS Coronavirus 2 by RT PCR NEGATIVE NEGATIVE Final    Comment: (NOTE) SARS-CoV-2 target nucleic acids are NOT DETECTED.  The SARS-CoV-2 RNA is generally detectable in upper respiratory specimens during the acute phase of infection. The lowest concentration of SARS-CoV-2 viral copies this assay can detect is 138 copies/mL. A negative result does not preclude SARS-Cov-2 infection and should not be used as the sole basis for treatment or other patient management decisions. A negative result may occur with  improper specimen collection/handling, submission of specimen other than nasopharyngeal swab, presence of viral mutation(s) within the areas targeted by this assay, and inadequate number of viral copies(<138 copies/mL). A negative result must be combined with clinical observations, patient history, and epidemiological information. The expected result is Negative.  Fact Sheet for Patients:   EntrepreneurPulse.com.au  Fact Sheet for Healthcare Providers:  IncredibleEmployment.be  This test is no t yet approved or cleared by the [place] FDA and  has been authorized for detection and/or diagnosis of SARS-CoV-2 by FDA under an Emergency Use Authorization (EUA). This EUA will remain  in effect (meaning this test can be used) for the duration of the COVID-19 declaration under Section 564(b)(1) of the Act, 21 U.S.C.section 360bbb-3(b)(1), unless the authorization is terminated  or revoked sooner.       Influenza A by PCR NEGATIVE NEGATIVE Final   Influenza B by PCR NEGATIVE NEGATIVE Final    Comment: (NOTE) The Xpert Xpress SARS-CoV-2/FLU/RSV plus assay is intended as an aid in the diagnosis of influenza from Nasopharyngeal swab specimens and should  not be used as a sole basis for treatment. Nasal washings and aspirates are unacceptable for Xpert Xpress SARS-CoV-2/FLU/RSV testing.  Fact Sheet for Patients: EntrepreneurPulse.com.au  Fact Sheet for Healthcare Providers: IncredibleEmployment.be  This test is not yet approved or cleared by the [place] FDA and has been authorized for detection and/or diagnosis of SARS-CoV-2 by FDA under an Emergency Use Authorization (EUA). This EUA will remain in effect (meaning this test can be used) for the duration of the COVID-19 declaration under Section 564(b)(1) of the Act, 21 U.S.C. section 360bbb-3(b)(1), unless the authorization is terminated or revoked.  Performed at [facility], [place] [address] [place], [place] [address]   MRSA PCR Screening     Status: None   Collection Time: 02/21/21  9:06 PM   Specimen: Nasal Mucosa; Nasopharyngeal  Result Value Ref Range Status   MRSA by PCR NEGATIVE NEGATIVE Final    Comment:        The GeneXpert MRSA Assay (FDA approved for NASAL specimens only), is one component of a comprehensive MRSA  colonization surveillance program. It is not intended to diagnose MRSA infection nor to guide or monitor treatment for MRSA infections. Performed at [facility], [place] [address] [place], [place] [address]          Radiology Studies: No results found.      Scheduled Meds: . amLODipine  10 mg Oral Daily  . atorvastatin  20 mg Oral Daily  . cloNIDine  0.1 mg Oral Daily  . enoxaparin (LOVENOX) injection  40 mg Subcutaneous Q24H  . feeding supplement  1 Container Oral TID BM  . ibuprofen  400 mg Oral QID  . influenza vac split quadrivalent PF  0.5 mL Intramuscular Tomorrow-1000  . insulin aspart  0-9 Units Subcutaneous Q4H  . isosorbide mononitrate  15 mg Oral Daily  . lisinopril  5 mg Oral Daily  . metFORMIN  500 mg Oral Q breakfast  . pantoprazole  40 mg Oral QHS  . polyethylene glycol  17 g Oral Daily  . senna-docusate  1 tablet Oral BID   Continuous Infusions:   LOS: 9 days        Hosie Poisson, MD [facility]   To contact the attending provider between 7A-7P or the covering provider during after hours 7P-7A, please log into the web site [email or web address] and access using universal [place] password for that web site. If you do not have the password, please call the hospital operator.  03/02/2021, 11:35 AM

## 2021-03-02 NOTE — Progress Notes (Signed)
Physical Therapy Treatment Patient Details Name: Chad Coleman MRN: [id number] DOB: 09/07/1961 Today's Date: 03/02/2021    History of Present Illness 60 yo male presents to [facility] on 3/28 with acute onset of L weakness, numbness, and L facial droop. CTH R BG ICH, suspect hypertensive. PMH includes hypertension, tobacco use, noncompliance with antihypertensive medications, and cocaine abuse.    PT Comments    Pt in bed upon arrival to room, reports and presents with drowsiness but agreeable to OOB mobility. Pt continues to present with heavy pushing towards L once sitting EOB, pt noticing he felt "off balance" when doing this and corrected self with visual cues via mirror and postural cues from PT. Pt tolerated repeated stands at EOB, requiring max +2 assist and complete support of LUE and LLE. Pt demonstrating impulsivity and poor safety awareness throughout session, when moving to chair pt sat down prematurely on the very edge of the recliner requiring PT to lift him up and boost him back into the chair. Pt left in recliner with posey belt chair alarm activated and fall pads placed, PT to continue to follow acutely.    Follow Up Recommendations  Supervision/Assistance - 24 hour;SNF     Equipment Recommendations  Other (comment) (TBD)    Recommendations for Other Services       Precautions / Restrictions Precautions Precautions: Fall Precaution Comments: Pusher, L neglect, impulsive Restrictions Weight Bearing Restrictions: No    Mobility  Bed Mobility Overal bed mobility: Needs Assistance Bed Mobility: Supine to Sit     Supine to sit: Mod assist;HOB elevated;+2 for safety/equipment     General bed mobility comments: Mod assist for trunk elevation via HHA, immediate assist to correct L lateral leaning, and scooting to EOB. Increased time and effort.    Transfers Overall transfer level: Needs assistance Equipment used: 2 person hand held assist Transfers: Sit to/from  [facility] Sit to Stand: Max assist;+2 physical assistance Stand pivot transfers: Max assist;+2 physical assistance;+2 safety/equipment;From elevated surface       General transfer comment: Max +2 for power up, rise, steady, and hip extension to reach full upright. Mirror utilized as external cue for postural correction. STS x2 from EOB, Stand pivot towards L x1 with max +2 for L knee blocking, LLE progression, steadying, correcting unsafe lower into recliner and requiring boost assist to get back further into chair.  Ambulation/Gait             General Gait Details: nt   Marine scientist Rankin (Stroke Patients Only) Modified Rankin (Stroke Patients Only) Pre-Morbid Rankin Score: No symptoms Modified Rankin: Severe disability     Balance Overall balance assessment: Needs assistance Sitting-balance support: Single extremity supported;Feet supported Sitting balance-Leahy Scale: Poor Sitting balance - Comments: heavy pushing to R, able to correct this with cues from PT and external visual cues of mirror. Pt additionally aided by PT sitting to his L, both to prevent L pushing and pt uses PT posture as guideline for his own upright sitting Postural control: Left lateral lean Standing balance support: Bilateral upper extremity supported Standing balance-Leahy Scale: Zero Standing balance comment: max +2               High Level Balance Comments: worked on RUE reaching in sitting, when pt reaching to R able to maintain sitting with min A.  Cognition Arousal/Alertness: Awake/alert Behavior During Therapy: Flat affect Overall Cognitive Status: Impaired/Different from baseline Area of Impairment: Attention;Memory;Following commands;Safety/judgement;Awareness;Problem solving                   Current Attention Level: Sustained Memory: Decreased short-term memory;Decreased recall of  precautions Following Commands: Follows one step commands consistently Safety/Judgement: Decreased awareness of safety;Decreased awareness of deficits Awareness: Intellectual Problem Solving: Slow processing;Decreased initiation;Requires verbal cues;Requires tactile cues;Difficulty sequencing General Comments: Pt initially restless and trying to exit bed when PT checked on pt early this am, when PT went back to room for session pt drowsy with periods of eye closing throughout session. Pt requires step-by-step cues, as well as cues to wait for PT assist      Exercises      General Comments        Pertinent Vitals/Pain Pain Assessment: Faces Faces Pain Scale: No hurt Pain Intervention(s): Limited activity within patient's tolerance;Monitored during session;Repositioned    Home Living                      Prior Function            PT Goals (current goals can now be found in the care plan section) Acute Rehab PT Goals Patient Stated Goal: go home and he states that he is ready to do that now PT Goal Formulation: With patient Time For Goal Achievement: 03/08/21 Potential to Achieve Goals: Good Progress towards PT goals: Progressing toward goals    Frequency    Min 4X/week      PT Plan Current plan remains appropriate    Co-evaluation              AM-PAC PT "6 Clicks" Mobility   Outcome Measure  Help needed turning from your back to your side while in a flat bed without using bedrails?: A Lot Help needed moving from lying on your back to sitting on the side of a flat bed without using bedrails?: A Lot Help needed moving to and from a bed to a chair (including a wheelchair)?: A Lot Help needed standing up from a chair using your arms (e.g., wheelchair or bedside chair)?: A Lot Help needed to walk in hospital room?: Total Help needed climbing 3-5 steps with a railing? : Total 6 Click Score: 10    End of Session Equipment Utilized During Treatment: Gait  belt Activity Tolerance: Patient limited by fatigue Patient left: with call bell/phone within reach;in chair;with chair alarm set;with restraints reapplied (posey belt chair alarm, R wrist restraint) Nurse Communication: Mobility status PT Visit Diagnosis: Hemiplegia and hemiparesis;Unsteadiness on feet (R26.81);Muscle weakness (generalized) (M62.81);Difficulty in walking, not elsewhere classified (R26.2);Other symptoms and signs involving the nervous system (R29.898) Hemiplegia - Right/Left: Left Hemiplegia - dominant/non-dominant: Dominant Hemiplegia - caused by: Nontraumatic intracerebral hemorrhage     Time: 1610-9604 PT Time Calculation (min) (ACUTE ONLY): 21 min  Charges:  $Neuromuscular Re-education: 8-22 mins                     Stacie Glaze, PT Acute Rehabilitation Services Pager [phone]  Office [phone]   Roxine Caddy E [name] 03/02/2021, 3:29 PM

## 2021-03-02 NOTE — Progress Notes (Signed)
  Speech Language Pathology Treatment: Dysphagia;Cognitive-Linquistic  Patient Details Name: Chad Coleman MRN: [id number] DOB: Mar 15, 1961 Today's Date: 03/02/2021 Time: 9532-0233 SLP Time Calculation (min) (ACUTE ONLY): 17 min  Assessment / Plan / Recommendation Clinical Impression  Intervention targeted primarily oral phase of swallow and observations noted re: cognition this session. No significant residue on left side on arrival after breakfast this morning. He is impulsive with bed mobility during positioning and demonstrated/reported significant pain/spasm in stomach (more right sided)- notified RN by secure chat.  Incomplete mastication of cracker and stasis in left buccal cavity and decreased labial awareness of residue. He requested a straw to "clean his teeth" and given toothbrush with cues to check and remove solids from left side mouth. Recommend pt remain on Dys 2 ( ground texture) with intervention focusing on awareness and removal of residue and compensatory strategy to masticate on intact right oral cavity.     HPI HPI: 60 y.o. male with a medical history significant for hypertension, tobacco use, noncompliance with antihypertensive medications, and cocaine abuse who presented to the ED via EMS as a Code Stroke for evaluation of acute onset of left-sided weakness, numbness, and left facial droop. Dx right basal ganglia ICH.      SLP Plan  Continue with current plan of care       Recommendations  Diet recommendations: Dysphagia 2 (fine chop);Thin liquid Liquids provided via: Cup;Straw Medication Administration: Whole meds with puree Supervision: Staff to assist with self feeding;Patient able to self feed Compensations: Minimize environmental distractions;Slow rate;Small sips/bites;Lingual sweep for clearance of pocketing Postural Changes and/or Swallow Maneuvers: Seated upright 90 degrees                Oral Care Recommendations: Oral care BID Follow up  Recommendations: Skilled Nursing facility SLP Visit Diagnosis: Dysphagia, unspecified (R13.10);Cognitive communication deficit (I35.686) Plan: Continue with current plan of care       GO                [name] 03/02/2021, 9:08 AM  Orbie Pyo Colvin Caroli.Ed Risk analyst [phone] Office [phone]

## 2021-03-03 ENCOUNTER — Inpatient Hospital Stay (HOSPITAL_COMMUNITY): Payer: Medicaid Other

## 2021-03-03 LAB — GLUCOSE, CAPILLARY
Glucose-Capillary: 124 mg/dL — ABNORMAL HIGH (ref 70–99)
Glucose-Capillary: 126 mg/dL — ABNORMAL HIGH (ref 70–99)
Glucose-Capillary: 133 mg/dL — ABNORMAL HIGH (ref 70–99)
Glucose-Capillary: 135 mg/dL — ABNORMAL HIGH (ref 70–99)
Glucose-Capillary: 179 mg/dL — ABNORMAL HIGH (ref 70–99)
Glucose-Capillary: 191 mg/dL — ABNORMAL HIGH (ref 70–99)

## 2021-03-03 LAB — BASIC METABOLIC PANEL
Anion gap: 12 (ref 5–15)
BUN: 10 mg/dL (ref 6–20)
CO2: 23 mmol/L (ref 22–32)
Calcium: 9.3 mg/dL (ref 8.9–10.3)
Chloride: 99 mmol/L (ref 98–111)
Creatinine, Ser: 1 mg/dL (ref 0.61–1.24)
GFR, Estimated: >60 mL/min (ref >60)
Glucose, Bld: 120 mg/dL — ABNORMAL HIGH (ref 70–99)
Potassium: 4 mmol/L (ref 3.5–5.1)
Sodium: 134 mmol/L — ABNORMAL LOW (ref 135–145)

## 2021-03-03 LAB — CORTISOL: Cortisol, Plasma: 31.8 ug/dL

## 2021-03-03 MED ORDER — HYDRALAZINE HCL 25 MG PO TABS
25.0000 mg | ORAL_TABLET | Freq: Three times a day (TID) | ORAL | Status: DC
  Administered 2021-03-03 – 2021-03-06 (×9): 25 mg via ORAL
  Filled 2021-03-03 (×9): qty 1

## 2021-03-03 MED ORDER — LISINOPRIL 10 MG PO TABS
10.0000 mg | ORAL_TABLET | Freq: Every day | ORAL | Status: DC
  Administered 2021-03-03 – 2021-03-09 (×7): 10 mg via ORAL
  Filled 2021-03-03 (×7): qty 1

## 2021-03-03 NOTE — Plan of Care (Signed)
  Problem: Intracerebral Hemorrhage Tissue Perfusion: Goal: Complications of Intracerebral Hemorrhage will be minimized Outcome: Progressing   Problem: Education: Goal: Knowledge of disease or condition will improve Outcome: Progressing Goal: Knowledge of secondary prevention will improve Outcome: Progressing Goal: Knowledge of patient specific risk factors addressed and post discharge goals established will improve Outcome: Progressing Goal: Individualized Educational Video(s) Outcome: Progressing

## 2021-03-03 NOTE — Progress Notes (Addendum)
Called by Radiology with Head CT report. Pt admitted with CVA on 02/21/21 by Neurology and transferred to [facility] service on 02/26/21.  Had intracranial hemorrhage in right basal ganglia due to HTN and cocaine use at admission.  Had repeat CT of head tonight which shows:    Increased size of 5.8 x 3.6 cm right intraparenchymal hematoma   with worsening leftward midline shift, now measuring 8 mm.   Effaced right lateral ventricle, which may become entrapped and   predisposes to hydrocephalus.  Paged On Call Neurosurgery, Dr. Cato Mulligan at [phone]. Spoke with Dr. Cato Mulligan at [phone] who will see pt tonight for evaluation. He recommends pt being transferred to ICU for close monitoring as he is at high risk of hydrocephalus with significant midline shift on CT scan.   Spoke with PCCM and pt can go to ICU under [facility] team. If pt has decompensation in condition PCCM would be happy to take over care at that time.

## 2021-03-03 NOTE — Consult Note (Incomplete)
Chief Complaint   Chief Complaint  Patient presents with  . Weakness    HPI   Consult requested by: Dr Tonie Griffith, [facility] Reason for consult: Intraparenchymal hemorrhage  HPI: Chad Coleman is a 60 y.o. male with history of HTN, DM, polysubstance abuse who presented to the ED as a code stroke on 3/28 for acute left sided weakness and left facial droop. He under head CT and was found to have a large right basal ganglia hemorrhage. He was admitted to the neuro ICU for further workup and management. ICH treated medically and did not require NS intervention.  Patient Active Problem List   Diagnosis Date Noted  . Essential hypertension   . Tobacco abuse   . Hyponatremia   . Leukocytosis   . Controlled type 2 diabetes mellitus with hyperglycemia, without long-term current use of insulin ([place])   . ICH (intracerebral hemorrhage) ([place]) 02/21/2021    PMH: Past Medical History:  Diagnosis Date  . Hypertension     PSH: No past surgical history on file.  Medications Prior to Admission  Medication Sig Dispense Refill Last Dose  . amLODipine (NORVASC) 5 MG tablet Take 1 tablet (5 mg total) by mouth daily. 30 tablet 1 02/21/2021 at Unknown time  . amLODipine (NORVASC) 5 MG tablet TAKE 1 TABLET (5 MG TOTAL) BY MOUTH DAILY. 30 tablet 1   . amLODipine (NORVASC) 5 MG tablet TAKE 1 TABLET (5 MG TOTAL) BY MOUTH DAILY. 30 tablet 0   . hydrochlorothiazide (HYDRODIURIL) 25 MG tablet Take 1 tablet (25 mg total) by mouth daily. (Patient not taking: No sig reported) 30 tablet 1 Not Taking at Unknown time    SH: Social History   Tobacco Use  . Smoking status: Current Every Day Smoker  . Smokeless tobacco: Never Used    MEDS: Prior to Admission medications   Medication Sig Start Date End Date Taking? Authorizing Provider  amLODipine (NORVASC) 5 MG tablet Take 1 tablet (5 mg total) by mouth daily. 02/10/21  Yes Isla Pence, MD  amLODipine (NORVASC) 5 MG tablet TAKE 1 TABLET (5 MG TOTAL) BY  MOUTH DAILY. 02/10/21 02/10/22  Isla Pence, MD  amLODipine (NORVASC) 5 MG tablet TAKE 1 TABLET (5 MG TOTAL) BY MOUTH DAILY. 02/10/21 02/10/22  Isla Pence, MD  hydrochlorothiazide (HYDRODIURIL) 25 MG tablet Take 1 tablet (25 mg total) by mouth daily. Patient not taking: No sig reported 09/30/20   Henderly, Britni A, PA-C    ALLERGY: No Known Allergies  Social History   Tobacco Use  . Smoking status: Current Every Day Smoker  . Smokeless tobacco: Never Used  Substance Use Topics  . Alcohol use: Not on file     No family history on file.   ROS   ROS  Exam   Vitals:   03/03/21 2020 03/03/21 2337  BP: (!) 167/92 (!) 181/94  Pulse: 87 86  Resp: 19   Temp: 99 F (37.2 C) 98.6 F (37 C)  SpO2: 97% 98%   General appearance: WDWN, NAD GCS: ***   - Eyes: 1- no eye opening, 2- eyes open to pain, 3-eyes open to command or shout, 4- eyes open spontaneously   - Verbal response: 1- no verbal response, 2- incomprehnsible speech/sound, 3- inappropriate response, 4- confused, but able to answer questions, 5= oriented  - Motor: 1= No motor, 2- extensor response, 3- spastic flexion, 4- withdraws from pain, 5= purposeful movement to pain, 6= obeys command Eyes: No scleral injection Cardiovascular: Regular rate and rhythm  without murmurs, rubs, gallops. No edema or variciosities. Distal pulses normal. Pulmonary: Effort normal, non-labored breathing Musculoskeletal:     Muscle tone upper extremities: Normal    Muscle tone lower extremities: Normal    Motor exam: Upper Extremities Deltoid Bicep Tricep Grip  Right 5/5 5/5 5/5 5/5  Left 5/5 5/5 5/5 5/5   Lower Extremity IP Quad PF DF EHL  Right 5/5 5/5 5/5 5/5 5/5  Left 5/5 5/5 5/5 5/5 5/5   Neurological Mental Status:    - Patient is awake, alert, oriented to person, place, month, year, and situation    - Patient is able to give a clear and coherent history.    - No signs of aphasia or neglect Cranial Nerves    - II: Visual  Fields are full. PERRL    - III/IV/VI: EOMI without ptosis or diploplia.     - V: Facial sensation is grossly normal    - VII: Facial movement is symmetric.     - VIII: hearing is intact to voice    - X: Uvula elevates symmetrically    - XI: Shoulder shrug is symmetric.    - XII: tongue is midline without atrophy or fasciculations.  Sensory: Sensation grossly intact to LT Deep Tendon Reflexes    - 2+ and symmetric in the biceps and patellae. *** Plantars   - Toes are downgoing bilaterally. *** Cerebellar    - FNF and HKS are intact bilaterally***   Results - Imaging/Labs   Results for orders placed or performed during the hospital encounter of 02/21/21 (from the past 48 hour(s))  Basic metabolic panel     Status: Abnormal   Collection Time: 03/02/21  3:35 AM  Result Value Ref Range   Sodium 130 (L) 135 - 145 mmol/L   Potassium 3.2 (L) 3.5 - 5.1 mmol/L   Chloride 96 (L) 98 - 111 mmol/L   CO2 25 22 - 32 mmol/L   Glucose, Bld 139 (H) 70 - 99 mg/dL    Comment: Glucose reference range applies only to samples taken after fasting for at least 8 hours.   BUN 13 6 - 20 mg/dL   Creatinine, Ser 1.02 0.61 - 1.24 mg/dL   Calcium 9.1 8.9 - 10.3 mg/dL   GFR, Estimated >60 >60 mL/min    Comment: (NOTE) Calculated using the CKD-EPI Creatinine Equation (2021)    Anion gap 9 5 - 15    Comment: Performed at [facility] [address], [place], [place] [address]  CBC with Differential/Platelet     Status: Abnormal   Collection Time: 03/02/21  3:35 AM  Result Value Ref Range   WBC 9.7 4.0 - 10.5 K/uL   RBC 5.27 4.22 - 5.81 MIL/uL   Hemoglobin 15.0 13.0 - 17.0 g/dL   HCT 45.2 39.0 - 52.0 %   MCV 85.8 80.0 - 100.0 fL   MCH 28.5 26.0 - 34.0 pg   MCHC 33.2 30.0 - 36.0 g/dL   RDW 11.9 11.5 - 15.5 %   Platelets 387 150 - 400 K/uL   nRBC 0.0 0.0 - 0.2 %   Neutrophils Relative % 74 %   Neutro Abs 7.3 1.7 - 7.7 K/uL   Lymphocytes Relative 9 %   Lymphs Abs 0.9 0.7 - 4.0 K/uL    Monocytes Relative 15 %   Monocytes Absolute 1.4 (H) 0.1 - 1.0 K/uL   Eosinophils Relative 1 %   Eosinophils Absolute 0.1 0.0 - 0.5 K/uL  Basophils Relative 0 %   Basophils Absolute 0.0 0.0 - 0.1 K/uL   Immature Granulocytes 1 %   Abs Immature Granulocytes 0.06 0.00 - 0.07 K/uL    Comment: Performed at [facility], [place] [address] [place], [place] [address]  Glucose, capillary     Status: Abnormal   Collection Time: 03/02/21  3:46 AM  Result Value Ref Range   Glucose-Capillary 168 (H) 70 - 99 mg/dL    Comment: Glucose reference range applies only to samples taken after fasting for at least 8 hours.   Comment 1 Notify RN    Comment 2 Document in Chart   Glucose, capillary     Status: Abnormal   Collection Time: 03/02/21  8:05 AM  Result Value Ref Range   Glucose-Capillary 172 (H) 70 - 99 mg/dL    Comment: Glucose reference range applies only to samples taken after fasting for at least 8 hours.  Glucose, capillary     Status: Abnormal   Collection Time: 03/02/21 12:34 PM  Result Value Ref Range   Glucose-Capillary 150 (H) 70 - 99 mg/dL    Comment: Glucose reference range applies only to samples taken after fasting for at least 8 hours.   Comment 1 Notify RN    Comment 2 Document in Chart   Osmolality     Status: Abnormal   Collection Time: 03/02/21 12:50 PM  Result Value Ref Range   Osmolality 274 (L) 275 - 295 mOsm/kg    Comment: Performed at [facility], [place] [address], [place],  [address]  TSH     Status: None   Collection Time: 03/02/21 12:50 PM  Result Value Ref Range   TSH 1.761 0.350 - 4.500 uIU/mL    Comment: Performed by a 3rd Generation assay with a functional sensitivity of <=0.01 uIU/mL. Performed at [facility], [place] [address] [place], [place] [address]   Glucose, capillary     Status: Abnormal   Collection Time: 03/02/21  4:15 PM  Result Value Ref Range   Glucose-Capillary 121 (H) 70 - 99 mg/dL    Comment: Glucose reference range  applies only to samples taken after fasting for at least 8 hours.   Comment 1 Notify RN    Comment 2 Document in Chart   Glucose, capillary     Status: Abnormal   Collection Time: 03/02/21  7:58 PM  Result Value Ref Range   Glucose-Capillary 128 (H) 70 - 99 mg/dL    Comment: Glucose reference range applies only to samples taken after fasting for at least 8 hours.  Glucose, capillary     Status: Abnormal   Collection Time: 03/02/21 11:28 PM  Result Value Ref Range   Glucose-Capillary 196 (H) 70 - 99 mg/dL    Comment: Glucose reference range applies only to samples taken after fasting for at least 8 hours.  Cortisol     Status: None   Collection Time: 03/03/21  3:00 AM  Result Value Ref Range   Cortisol, Plasma 31.8 ug/dL    Comment: (NOTE) AM    6.7 - 22.6 ug/dL PM   <10.0       ug/dL Performed at [facility] [address] [place], [place] [address]   Glucose, capillary     Status: Abnormal   Collection Time: 03/03/21  3:47 AM  Result Value Ref Range   Glucose-Capillary 124 (H) 70 - 99 mg/dL    Comment: Glucose reference range applies only  to samples taken after fasting for at least 8 hours.  Glucose, capillary     Status: Abnormal   Collection Time: 03/03/21  8:31 AM  Result Value Ref Range   Glucose-Capillary 126 (H) 70 - 99 mg/dL    Comment: Glucose reference range applies only to samples taken after fasting for at least 8 hours.   Comment 1 Notify RN    Comment 2 Document in Chart   Basic metabolic panel     Status: Abnormal   Collection Time: 03/03/21  9:10 AM  Result Value Ref Range   Sodium 134 (L) 135 - 145 mmol/L   Potassium 4.0 3.5 - 5.1 mmol/L   Chloride 99 98 - 111 mmol/L   CO2 23 22 - 32 mmol/L   Glucose, Bld 120 (H) 70 - 99 mg/dL    Comment: Glucose reference range applies only to samples taken after fasting for at least 8 hours.   BUN 10 6 - 20 mg/dL   Creatinine, Ser 1.00 0.61 - 1.24 mg/dL   Calcium 9.3 8.9 - 10.3 mg/dL   GFR, Estimated >60 >60  mL/min    Comment: (NOTE) Calculated using the CKD-EPI Creatinine Equation (2021)    Anion gap 12 5 - 15    Comment: Performed at [facility] [address] [place], [place] [address]  Glucose, capillary     Status: Abnormal   Collection Time: 03/03/21 12:16 PM  Result Value Ref Range   Glucose-Capillary 179 (H) 70 - 99 mg/dL    Comment: Glucose reference range applies only to samples taken after fasting for at least 8 hours.   Comment 1 Notify RN    Comment 2 Document in Chart   Glucose, capillary     Status: Abnormal   Collection Time: 03/03/21  4:41 PM  Result Value Ref Range   Glucose-Capillary 135 (H) 70 - 99 mg/dL    Comment: Glucose reference range applies only to samples taken after fasting for at least 8 hours.   Comment 1 Notify RN    Comment 2 Document in Chart   Glucose, capillary     Status: Abnormal   Collection Time: 03/03/21  8:21 PM  Result Value Ref Range   Glucose-Capillary 133 (H) 70 - 99 mg/dL    Comment: Glucose reference range applies only to samples taken after fasting for at least 8 hours.  Glucose, capillary     Status: Abnormal   Collection Time: 03/03/21 11:38 PM  Result Value Ref Range   Glucose-Capillary 191 (H) 70 - 99 mg/dL    Comment: Glucose reference range applies only to samples taken after fasting for at least 8 hours.    CT HEAD WO CONTRAST  Result Date: 03/03/2021 CLINICAL DATA:  Intracranial hemorrhage follow up EXAM: CT HEAD WITHOUT CONTRAST TECHNIQUE: Contiguous axial images were obtained from the base of the skull through the vertex without intravenous contrast. COMPARISON:  Head CT 02/22/2021 FINDINGS: Brain: Intraparenchymal hematoma has expanded to 5.8 x 3.6 cm, previously 5.5 x 2.8 cm. Leftward midline shift has also worsened and now measures 8 mm, previously 1 mm. The right lateral ventricle is effaced. Vascular: No hyperdense vessel or unexpected calcification. Skull: Normal. Negative for fracture or focal lesion.  Sinuses/Orbits: Bilateral maxillary sinus mucosal thickening. Normal orbits. Other: None IMPRESSION: 1. Increased size of 5.8 x 3.6 cm right intraparenchymal hematoma with worsening leftward midline shift, now measuring 8 mm. 2. Effaced right lateral ventricle, which may become entrapped and predisposes to  hydrocephalus. These results will be called to the ordering clinician or representative by the Radiologist Assistant, and communication documented in the PACS or [employer]. Electronically Signed   By: Ulyses Jarred M.D.   On: 03/03/2021 22:46   US Abdomen Complete  Result Date: 03/03/2021 CLINICAL DATA:  Abdominal pain. EXAM: ABDOMEN ULTRASOUND COMPLETE COMPARISON:  CT 09/30/2020. FINDINGS: Gallbladder: No gallstones or wall thickening visualized. No sonographic Murphy sign noted by sonographer. Common bile duct: Diameter: 2.5 mm Liver: No focal lesion identified. Within normal limits in parenchymal echogenicity. Portal vein is patent on color Doppler imaging with normal direction of blood flow towards the liver. IVC: No abnormality visualized. Pancreas: Visualized portion unremarkable. Spleen: Patient refused to complete exam spleen not imaged. Right Kidney: Length: 10.4 cm. Echogenicity within normal limits. No mass or hydronephrosis visualized. Left Kidney: Length: Patient refused to complete exam, left kidney not imaged. Abdominal aorta: No aneurysm visualized. Other findings: None. IMPRESSION: 1. Patient refused to complete exam. Spleen and left kidney not imaged. 2. No acute abnormality identified. No gallstones or biliary distention. Electronically Signed   By: Marcello Moores  Register   On: 03/03/2021 11:35   DG Abd 2 Views  Result Date: 03/02/2021 CLINICAL DATA:  Abdominal pain EXAM: ABDOMEN - 2 VIEW COMPARISON:  CT abdomen and pelvis September 30, 2020 FINDINGS: Supine and upright images were obtained. There is contrast throughout the colon. There is no bowel dilatation or air-fluid level to suggest  bowel obstruction. No free air. No abnormal calcifications evident. Note that contrast in the colon could mask smaller calcifications. Lung bases clear. IMPRESSION: Contrast throughout colon. No bowel obstruction or free air. No abnormal calcifications; note that contrast in the colon could mask smaller calcifications. Lung bases clear. Electronically Signed   By: Lowella Grip III M.D.   On: 03/02/2021 13:58    IMAGING: {***}  Impression/Plan   - 60 y.o. male {***}  SAH scoring Fisher:  1: thin SAH, no IVH 2: thin SAH, +IVH 3: Thick SAH, no IVH 4: Thick SAH, +IVH  Hunt Hess Grade 1: Asx or mild headache Grade 2: Moderate to severe headache, nuchal rigidity, no neuro deficit Grade 3: drowsy, confused, mild focal deficit Grade 4: Stupor, moderate to severe hemiparesis Grade 5: come, posturing

## 2021-03-03 NOTE — Plan of Care (Signed)
  Problem: Intracerebral Hemorrhage Tissue Perfusion: Goal: Complications of Intracerebral Hemorrhage will be minimized Outcome: Progressing   Problem: Self-Care: Goal: Ability to participate in self-care as condition permits will improve Outcome: Progressing   Problem: Health Behavior/Discharge Planning: Goal: Ability to manage health-related needs will improve Outcome: Progressing   Problem: Coping: Goal: Will identify appropriate support needs Outcome: Progressing

## 2021-03-03 NOTE — Consult Note (Signed)
Chief Complaint   Chief Complaint  Patient presents with  . Weakness    HPI   Consult requested by: Dr Tonie Griffith, [facility] Reason for consult: Intraparenchymal hemorrhage  HPI: Chad Coleman is a 60 y.o. male with history of HTN, DM, polysubstance abuse who presented to the ED as a code stroke on 3/28 for acute left sided weakness and left facial droop. He under head CT and was found to have a large right basal ganglia hemorrhage. He was admitted to the neuro ICU for further workup and management. ICH treated medically and did not require NS intervention. Patient was transferred to [facility] service on 4/2 and has been pending discharge to SNF. Today patient had a repeat head CT due to a fall with + head injury. CT showed increased size of the hematoma with worsening MLS. A NSY consultation was requested. I was in the hospital and immediately came by to evaluate the patient. He was sleeping when I entered. He was easily awakened. He has no concerns. Stable left hemiplegia. Denies new HA, N/T/W. Discussed with nursing who agrees patient is at reported baseline.  Patient Active Problem List   Diagnosis Date Noted  . Essential hypertension   . Tobacco abuse   . Hyponatremia   . Leukocytosis   . Controlled type 2 diabetes mellitus with hyperglycemia, without long-term current use of insulin ([place])   . ICH (intracerebral hemorrhage) ([place]) 02/21/2021    PMH: Past Medical History:  Diagnosis Date  . Hypertension     PSH: No past surgical history on file.  Medications Prior to Admission  Medication Sig Dispense Refill Last Dose  . amLODipine (NORVASC) 5 MG tablet Take 1 tablet (5 mg total) by mouth daily. 30 tablet 1 02/21/2021 at Unknown time  . amLODipine (NORVASC) 5 MG tablet TAKE 1 TABLET (5 MG TOTAL) BY MOUTH DAILY. 30 tablet 1   . amLODipine (NORVASC) 5 MG tablet TAKE 1 TABLET (5 MG TOTAL) BY MOUTH DAILY. 30 tablet 0   . hydrochlorothiazide (HYDRODIURIL) 25 MG tablet Take 1 tablet (25 mg  total) by mouth daily. (Patient not taking: No sig reported) 30 tablet 1 Not Taking at Unknown time    SH: Social History   Tobacco Use  . Smoking status: Current Every Day Smoker  . Smokeless tobacco: Never Used    MEDS: Prior to Admission medications   Medication Sig Start Date End Date Taking? Authorizing Provider  amLODipine (NORVASC) 5 MG tablet Take 1 tablet (5 mg total) by mouth daily. 02/10/21  Yes Isla Pence, MD  amLODipine (NORVASC) 5 MG tablet TAKE 1 TABLET (5 MG TOTAL) BY MOUTH DAILY. 02/10/21 02/10/22  Isla Pence, MD  amLODipine (NORVASC) 5 MG tablet TAKE 1 TABLET (5 MG TOTAL) BY MOUTH DAILY. 02/10/21 02/10/22  Isla Pence, MD  hydrochlorothiazide (HYDRODIURIL) 25 MG tablet Take 1 tablet (25 mg total) by mouth daily. Patient not taking: No sig reported 09/30/20   Henderly, Britni A, PA-C    ALLERGY: No Known Allergies  Social History   Tobacco Use  . Smoking status: Current Every Day Smoker  . Smokeless tobacco: Never Used  Substance Use Topics  . Alcohol use: Not on file     No family history on file.   ROS   Review of Systems  All other systems reviewed and are negative.   Exam   Vitals:   03/03/21 2020 03/03/21 2337  BP: (!) 167/92 (!) 181/94  Pulse: 87 86  Resp: 19   Temp:  99 F (37.2 C) 98.6 F (37 C)  SpO2: 97% 98%   General appearance: resting comfortably, NAD Eyes: No scleral injection Cardiovascular: Regular rate and rhythm without murmurs, rubs, gallops. No edema or variciosities. Distal pulses normal. Pulmonary: Effort normal, non-labored breathing Musculoskeletal: R sided grossly normal. Left 0/5 Neurological Mental Status: Patient is sleeping, easily awakened. Does require repetitive stimulation to stay awake. He is oriented to self, location and year. Cranial Nerves: left facial droop  Results - Imaging/Labs   Results for orders placed or performed during the hospital encounter of 02/21/21 (from the past 48 hour(s))   Basic metabolic panel     Status: Abnormal   Collection Time: 03/02/21  3:35 AM  Result Value Ref Range   Sodium 130 (L) 135 - 145 mmol/L   Potassium 3.2 (L) 3.5 - 5.1 mmol/L   Chloride 96 (L) 98 - 111 mmol/L   CO2 25 22 - 32 mmol/L   Glucose, Bld 139 (H) 70 - 99 mg/dL    Comment: Glucose reference range applies only to samples taken after fasting for at least 8 hours.   BUN 13 6 - 20 mg/dL   Creatinine, Ser 1.02 0.61 - 1.24 mg/dL   Calcium 9.1 8.9 - 10.3 mg/dL   GFR, Estimated >60 >60 mL/min    Comment: (NOTE) Calculated using the CKD-EPI Creatinine Equation (2021)    Anion gap 9 5 - 15    Comment: Performed at [facility] [address], [place], [place] [address]  CBC with Differential/Platelet     Status: Abnormal   Collection Time: 03/02/21  3:35 AM  Result Value Ref Range   WBC 9.7 4.0 - 10.5 K/uL   RBC 5.27 4.22 - 5.81 MIL/uL   Hemoglobin 15.0 13.0 - 17.0 g/dL   HCT 45.2 39.0 - 52.0 %   MCV 85.8 80.0 - 100.0 fL   MCH 28.5 26.0 - 34.0 pg   MCHC 33.2 30.0 - 36.0 g/dL   RDW 11.9 11.5 - 15.5 %   Platelets 387 150 - 400 K/uL   nRBC 0.0 0.0 - 0.2 %   Neutrophils Relative % 74 %   Neutro Abs 7.3 1.7 - 7.7 K/uL   Lymphocytes Relative 9 %   Lymphs Abs 0.9 0.7 - 4.0 K/uL   Monocytes Relative 15 %   Monocytes Absolute 1.4 (H) 0.1 - 1.0 K/uL   Eosinophils Relative 1 %   Eosinophils Absolute 0.1 0.0 - 0.5 K/uL   Basophils Relative 0 %   Basophils Absolute 0.0 0.0 - 0.1 K/uL   Immature Granulocytes 1 %   Abs Immature Granulocytes 0.06 0.00 - 0.07 K/uL    Comment: Performed at [facility] [address], [place], [place] [address]  Glucose, capillary     Status: Abnormal   Collection Time: 03/02/21  3:46 AM  Result Value Ref Range   Glucose-Capillary 168 (H) 70 - 99 mg/dL    Comment: Glucose reference range applies only to samples taken after fasting for at least 8 hours.   Comment 1 Notify RN    Comment 2 Document in Chart   Glucose, capillary     Status:  Abnormal   Collection Time: 03/02/21  8:05 AM  Result Value Ref Range   Glucose-Capillary 172 (H) 70 - 99 mg/dL    Comment: Glucose reference range applies only to samples taken after fasting for at least 8 hours.  Glucose, capillary     Status: Abnormal  Collection Time: 03/02/21 12:34 PM  Result Value Ref Range   Glucose-Capillary 150 (H) 70 - 99 mg/dL    Comment: Glucose reference range applies only to samples taken after fasting for at least 8 hours.   Comment 1 Notify RN    Comment 2 Document in Chart   Osmolality     Status: Abnormal   Collection Time: 03/02/21 12:50 PM  Result Value Ref Range   Osmolality 274 (L) 275 - 295 mOsm/kg    Comment: Performed at [facility], [place] [address], [place], [place] [address]  TSH     Status: None   Collection Time: 03/02/21 12:50 PM  Result Value Ref Range   TSH 1.761 0.350 - 4.500 uIU/mL    Comment: Performed by a 3rd Generation assay with a functional sensitivity of <=0.01 uIU/mL. Performed at [facility], [place] [address], [place], [place] [address]   Glucose, capillary     Status: Abnormal   Collection Time: 03/02/21  4:15 PM  Result Value Ref Range   Glucose-Capillary 121 (H) 70 - 99 mg/dL    Comment: Glucose reference range applies only to samples taken after fasting for at least 8 hours.   Comment 1 Notify RN    Comment 2 Document in Chart   Glucose, capillary     Status: Abnormal   Collection Time: 03/02/21  7:58 PM  Result Value Ref Range   Glucose-Capillary 128 (H) 70 - 99 mg/dL    Comment: Glucose reference range applies only to samples taken after fasting for at least 8 hours.  Glucose, capillary     Status: Abnormal   Collection Time: 03/02/21 11:28 PM  Result Value Ref Range   Glucose-Capillary 196 (H) 70 - 99 mg/dL    Comment: Glucose reference range applies only to samples taken after fasting for at least 8 hours.  Cortisol     Status: None   Collection Time: 03/03/21  3:00 AM  Result Value Ref Range    Cortisol, Plasma 31.8 ug/dL    Comment: (NOTE) AM    6.7 - 22.6 ug/dL PM   <10.0       ug/dL Performed at [facility] [address] [place], [place] [address]   Glucose, capillary     Status: Abnormal   Collection Time: 03/03/21  3:47 AM  Result Value Ref Range   Glucose-Capillary 124 (H) 70 - 99 mg/dL    Comment: Glucose reference range applies only to samples taken after fasting for at least 8 hours.  Glucose, capillary     Status: Abnormal   Collection Time: 03/03/21  8:31 AM  Result Value Ref Range   Glucose-Capillary 126 (H) 70 - 99 mg/dL    Comment: Glucose reference range applies only to samples taken after fasting for at least 8 hours.   Comment 1 Notify RN    Comment 2 Document in Chart   Basic metabolic panel     Status: Abnormal   Collection Time: 03/03/21  9:10 AM  Result Value Ref Range   Sodium 134 (L) 135 - 145 mmol/L   Potassium 4.0 3.5 - 5.1 mmol/L   Chloride 99 98 - 111 mmol/L   CO2 23 22 - 32 mmol/L   Glucose, Bld 120 (H) 70 - 99 mg/dL    Comment: Glucose reference range applies only to samples taken after fasting for at least 8 hours.   BUN 10 6 - 20 mg/dL   Creatinine, Ser  1.00 0.61 - 1.24 mg/dL   Calcium 9.3 8.9 - 10.3 mg/dL   GFR, Estimated >60 >60 mL/min    Comment: (NOTE) Calculated using the CKD-EPI Creatinine Equation (2021)    Anion gap 12 5 - 15    Comment: Performed at [facility] [address], [place], [place] [address]  Glucose, capillary     Status: Abnormal   Collection Time: 03/03/21 12:16 PM  Result Value Ref Range   Glucose-Capillary 179 (H) 70 - 99 mg/dL    Comment: Glucose reference range applies only to samples taken after fasting for at least 8 hours.   Comment 1 Notify RN    Comment 2 Document in Chart   Glucose, capillary     Status: Abnormal   Collection Time: 03/03/21  4:41 PM  Result Value Ref Range   Glucose-Capillary 135 (H) 70 - 99 mg/dL    Comment: Glucose reference range applies only to samples  taken after fasting for at least 8 hours.   Comment 1 Notify RN    Comment 2 Document in Chart   Glucose, capillary     Status: Abnormal   Collection Time: 03/03/21  8:21 PM  Result Value Ref Range   Glucose-Capillary 133 (H) 70 - 99 mg/dL    Comment: Glucose reference range applies only to samples taken after fasting for at least 8 hours.  Glucose, capillary     Status: Abnormal   Collection Time: 03/03/21 11:38 PM  Result Value Ref Range   Glucose-Capillary 191 (H) 70 - 99 mg/dL    Comment: Glucose reference range applies only to samples taken after fasting for at least 8 hours.    CT HEAD WO CONTRAST  Result Date: 03/03/2021 CLINICAL DATA:  Intracranial hemorrhage follow up EXAM: CT HEAD WITHOUT CONTRAST TECHNIQUE: Contiguous axial images were obtained from the base of the skull through the vertex without intravenous contrast. COMPARISON:  Head CT 02/22/2021 FINDINGS: Brain: Intraparenchymal hematoma has expanded to 5.8 x 3.6 cm, previously 5.5 x 2.8 cm. Leftward midline shift has also worsened and now measures 8 mm, previously 1 mm. The right lateral ventricle is effaced. Vascular: No hyperdense vessel or unexpected calcification. Skull: Normal. Negative for fracture or focal lesion. Sinuses/Orbits: Bilateral maxillary sinus mucosal thickening. Normal orbits. Other: None IMPRESSION: 1. Increased size of 5.8 x 3.6 cm right intraparenchymal hematoma with worsening leftward midline shift, now measuring 8 mm. 2. Effaced right lateral ventricle, which may become entrapped and predisposes to hydrocephalus. These results will be called to the ordering clinician or representative by the Radiologist Assistant, and communication documented in the PACS or [employer]. Electronically Signed   By: Ulyses Jarred M.D.   On: 03/03/2021 22:46   US Abdomen Complete  Result Date: 03/03/2021 CLINICAL DATA:  Abdominal pain. EXAM: ABDOMEN ULTRASOUND COMPLETE COMPARISON:  CT 09/30/2020. FINDINGS: Gallbladder:  No gallstones or wall thickening visualized. No sonographic Murphy sign noted by sonographer. Common bile duct: Diameter: 2.5 mm Liver: No focal lesion identified. Within normal limits in parenchymal echogenicity. Portal vein is patent on color Doppler imaging with normal direction of blood flow towards the liver. IVC: No abnormality visualized. Pancreas: Visualized portion unremarkable. Spleen: Patient refused to complete exam spleen not imaged. Right Kidney: Length: 10.4 cm. Echogenicity within normal limits. No mass or hydronephrosis visualized. Left Kidney: Length: Patient refused to complete exam, left kidney not imaged. Abdominal aorta: No aneurysm visualized. Other findings: None. IMPRESSION: 1. Patient refused to complete exam. Spleen and left kidney  not imaged. 2. No acute abnormality identified. No gallstones or biliary distention. Electronically Signed   By: Marcello Moores  Register   On: 03/03/2021 11:35   DG Abd 2 Views  Result Date: 03/02/2021 CLINICAL DATA:  Abdominal pain EXAM: ABDOMEN - 2 VIEW COMPARISON:  CT abdomen and pelvis September 30, 2020 FINDINGS: Supine and upright images were obtained. There is contrast throughout the colon. There is no bowel dilatation or air-fluid level to suggest bowel obstruction. No free air. No abnormal calcifications evident. Note that contrast in the colon could mask smaller calcifications. Lung bases clear. IMPRESSION: Contrast throughout colon. No bowel obstruction or free air. No abnormal calcifications; note that contrast in the colon could mask smaller calcifications. Lung bases clear. Electronically Signed   By: Lowella Grip III M.D.   On: 03/02/2021 13:58    Impression/Plan   60 y.o. male currently admitted for right basal ganglia ICH with resultant left hemiplegia, now >10 days out, who underwent head CT this evening for evaluation of a fall earlier today. Head CT revealed increased size of right basal ganglia hematoma with worsening MLS. He is at  neurologic baseline per my discussion with nursing. I have reviewed the case/imaging with Dr Kathyrn Sheriff. There is no indication for NS intervention at present including hematoma evacuation. Suspect this worsening is the natural progression of the ICH. Nonetheless, I have recommended transfer to ICU for close monitoring. Will plan on repeat CT tomorrow. If both CT and and patient are stable could transfer back to the floor.  Ferne Reus, PA-C [facility] [employer]

## 2021-03-03 NOTE — Progress Notes (Signed)
PROGRESS NOTE    Chad Coleman  HCW:[id number] DOB: 01/21/1961 DOA: 02/21/2021 PCP: Patient, No Pcp Per (Inactive)    Chief Complaint  Patient presents with  . Weakness    Brief Narrative:  60 year old gentleman prior history of hypertension, cocaine use, tobacco abuse admitted on 02/21/2021 with a code stroke was found to have intracranial hemorrhage in the right basal ganglia secondary to hypertension and cocaine use.  Patient was transferred from neurology service to hospitalist service on 02/26/2021. Therapy evaluations recommending SNF. Assessment & Plan:   Active Problems:   ICH (intracerebral hemorrhage) ([place])   Essential hypertension   Tobacco abuse   Hyponatremia   Leukocytosis   Controlled type 2 diabetes mellitus with hyperglycemia, without long-term current use of insulin ([facility])  Intracranial hemorrhage with 1-2 mm leftward shift Optimal Blood pressure control. Echocardiogram showed left ventricle ejection fraction 65 to 70% with grade 1 diastolic dysfunction. LDL is 52. Speech evaluation recommending dysphagia 2 diet. Pt currently denies any headache, nausea or vomiting.   Hypokalemia Replaced, repeat level wnl.   Hypertension Suboptimally controlled, Added hydralazine 25 mg TID, in addition to Norvasc 10 mg, clonidine 0.1 mg daily, isosorbide 15 mg daily, lisinopril 5 mg daily   Hyper lipidemia Continue with statin.   Type 2 diabetes mellitus uncontrolled with hyperglycemia 6.4 Continue with Metformin. CBG (last 3)  Recent Labs    03/02/21 2328 03/03/21 0347 03/03/21 0831  GLUCAP 196* 124* 126*  Continue with SSI.     Hyponatremia Much improved to 134 today.  Probably secondary to poor oral intake. Urine sodium less than 10, evaluate for SIADH.  DC thiazide  Mild abdominal pain: abd film is negative for obstruction.   Mild elevation of liver enzymes.  [place] ABD ordered.   Therapy evaluation recommending SNF,    DVT prophylaxis:  SCDs Code Status: Full code Family Communication: family at bedside.  Disposition:   Status is: Inpatient  Remains inpatient appropriate because:Inpatient level of care appropriate due to severity of illness   Dispo: The patient is from: Home              Anticipated d/c is to: SNF              Patient currently is not medically stable to d/c.   Difficult to place patient Yes       Consultants:   Neurology   Procedures: Echocardiogram  MRI of the brain  Antimicrobials: None  Subjective: No abd pain , no nausea, vomiting or abd pain.    Objective: Vitals:   03/02/21 2327 03/03/21 0346 03/03/21 0838 03/03/21 0901  BP: (!) 148/90 (!) 161/91 (!) 188/109 (!) 151/96  Pulse: 81 86 73   Resp:   18   Temp: 98.5 F (36.9 C) 98.1 F (36.7 C) 99.1 F (37.3 C)   TempSrc: Oral Oral Oral   SpO2: 97% 98% 100%   Weight:      Height:        Intake/Output Summary (Last 24 hours) at 03/03/2021 1056 Last data filed at 03/03/2021 1031 Gross per 24 hour  Intake 557 ml  Output 3700 ml  Net -3143 ml   Filed Weights   02/25/21 1357  Weight: 73.2 kg    Examination:  General exam: alert and comfortable.  Respiratory system: clear to auscultation, no wheezing heard.  Cardiovascular system: S1S2 heard, RRR, no JVD,.  Gastrointestinal system: Abdomen is soft, non tender non distended, bowel sounds wnl.  Central nervous system: alert and answering  simple questions.  Extremities: No pedal edema.  Skin: No rashes seen  Psychiatry:  Mood is appropriate.     Data Reviewed: I have personally reviewed following labs and imaging studies  CBC: Recent Labs  Lab 02/26/21 0254 02/27/21 0127 02/28/21 0505 03/01/21 0657 03/02/21 0335  WBC 7.3 6.6 7.1 7.5 9.7  NEUTROABS  --   --   --   --  7.3  HGB 15.3 15.2 14.2 14.3 15.0  HCT 43.8 45.8 43.2 42.6 45.2  MCV 84.7 86.1 88.3 86.8 85.8  PLT 300 227 300 326 128    Basic Metabolic Panel: Recent Labs  Lab 02/26/21 0254  02/27/21 0127 02/28/21 0505 03/02/21 0335 03/03/21 0910  NA 127* 131* 134* 130* 134*  K 3.1* 4.3 4.2 3.2* 4.0  CL 91* 98 101 96* 99  CO2 25 26 26 25 23   GLUCOSE 114* 116* 118* 139* 120*  BUN 20 21* 16 13 10   CREATININE 1.21 1.25* 1.09 1.02 1.00  CALCIUM 8.7* 8.7* 8.7* 9.1 9.3  MG 2.3 2.5*  --   --   --   PHOS  --   --  2.7  --   --     GFR: Estimated Creatinine Clearance: 79.5 mL/min (by C-G formula based on SCr of 1 mg/dL).  Liver Function Tests: Recent Labs  Lab 02/26/21 0254 02/27/21 0127 02/28/21 0505  AST 53* 51*  --   ALT 50* 56*  --   ALKPHOS 39 40  --   BILITOT 1.4* 1.1  --   PROT 6.5 6.4*  --   ALBUMIN 3.3* 3.3* 2.9*    CBG: Recent Labs  Lab 03/02/21 1615 03/02/21 1958 03/02/21 2328 03/03/21 0347 03/03/21 0831  GLUCAP 121* 128* 196* 124* 126*     Recent Results (from the past 240 hour(s))  Resp Panel by RT-PCR (Flu A&B, Covid) Nasopharyngeal Swab     Status: None   Collection Time: 02/21/21  7:11 PM   Specimen: Nasopharyngeal Swab; Nasopharyngeal(NP) swabs in vial transport medium  Result Value Ref Range Status   SARS Coronavirus 2 by RT PCR NEGATIVE NEGATIVE Final    Comment: (NOTE) SARS-CoV-2 target nucleic acids are NOT DETECTED.  The SARS-CoV-2 RNA is generally detectable in upper respiratory specimens during the acute phase of infection. The lowest concentration of SARS-CoV-2 viral copies this assay can detect is 138 copies/mL. A negative result does not preclude SARS-Cov-2 infection and should not be used as the sole basis for treatment or other patient management decisions. A negative result may occur with  improper specimen collection/handling, submission of specimen other than nasopharyngeal swab, presence of viral mutation(s) within the areas targeted by this assay, and inadequate number of viral copies(<138 copies/mL). A negative result must be combined with clinical observations, patient history, and epidemiological information.  The expected result is Negative.  Fact Sheet for Patients:  EntrepreneurPulse.com.au  Fact Sheet for Healthcare Providers:  IncredibleEmployment.be  This test is no t yet approved or cleared by the [place] FDA and  has been authorized for detection and/or diagnosis of SARS-CoV-2 by FDA under an Emergency Use Authorization (EUA). This EUA will remain  in effect (meaning this test can be used) for the duration of the COVID-19 declaration under Section 564(b)(1) of the Act, 21 U.S.C.section 360bbb-3(b)(1), unless the authorization is terminated  or revoked sooner.       Influenza A by PCR NEGATIVE NEGATIVE Final   Influenza B by PCR NEGATIVE NEGATIVE Final    Comment: (NOTE)  The Xpert Xpress SARS-CoV-2/FLU/RSV plus assay is intended as an aid in the diagnosis of influenza from Nasopharyngeal swab specimens and should not be used as a sole basis for treatment. Nasal washings and aspirates are unacceptable for Xpert Xpress SARS-CoV-2/FLU/RSV testing.  Fact Sheet for Patients: EntrepreneurPulse.com.au  Fact Sheet for Healthcare Providers: IncredibleEmployment.be  This test is not yet approved or cleared by the [place] FDA and has been authorized for detection and/or diagnosis of SARS-CoV-2 by FDA under an Emergency Use Authorization (EUA). This EUA will remain in effect (meaning this test can be used) for the duration of the COVID-19 declaration under Section 564(b)(1) of the Act, 21 U.S.C. section 360bbb-3(b)(1), unless the authorization is terminated or revoked.  Performed at [facility], [place] [address], [place], [place] [address]   MRSA PCR Screening     Status: None   Collection Time: 02/21/21  9:06 PM   Specimen: Nasal Mucosa; Nasopharyngeal  Result Value Ref Range Status   MRSA by PCR NEGATIVE NEGATIVE Final    Comment:        The GeneXpert MRSA Assay (FDA approved for NASAL  specimens only), is one component of a comprehensive MRSA colonization surveillance program. It is not intended to diagnose MRSA infection nor to guide or monitor treatment for MRSA infections. Performed at [facility], [place] [address], [place], [place] [address]          Radiology Studies: DG Abd 2 Views  Result Date: 03/02/2021 CLINICAL DATA:  Abdominal pain EXAM: ABDOMEN - 2 VIEW COMPARISON:  CT abdomen and pelvis September 30, 2020 FINDINGS: Supine and upright images were obtained. There is contrast throughout the colon. There is no bowel dilatation or air-fluid level to suggest bowel obstruction. No free air. No abnormal calcifications evident. Note that contrast in the colon could mask smaller calcifications. Lung bases clear. IMPRESSION: Contrast throughout colon. No bowel obstruction or free air. No abnormal calcifications; note that contrast in the colon could mask smaller calcifications. Lung bases clear. Electronically Signed   By: Lowella Grip III M.D.   On: 03/02/2021 13:58        Scheduled Meds: . amLODipine  10 mg Oral Daily  . atorvastatin  20 mg Oral Daily  . cloNIDine  0.1 mg Oral Daily  . enoxaparin (LOVENOX) injection  40 mg Subcutaneous Q24H  . feeding supplement  1 Container Oral TID BM  . ibuprofen  400 mg Oral QID  . influenza vac split quadrivalent PF  0.5 mL Intramuscular Tomorrow-1000  . insulin aspart  0-9 Units Subcutaneous Q4H  . isosorbide mononitrate  15 mg Oral Daily  . lisinopril  5 mg Oral Daily  . metFORMIN  500 mg Oral Q breakfast  . pantoprazole  40 mg Oral QHS  . polyethylene glycol  17 g Oral Daily  . senna-docusate  1 tablet Oral BID   Continuous Infusions:   LOS: 10 days        Hosie Poisson, MD [facility]   To contact the attending provider between 7A-7P or the covering provider during after hours 7P-7A, please log into the web site [email or web address] and access using universal [place] password for that  web site. If you do not have the password, please call the hospital operator.  03/03/2021, 10:56 AM

## 2021-03-04 ENCOUNTER — Inpatient Hospital Stay (HOSPITAL_COMMUNITY): Payer: Medicaid Other

## 2021-03-04 LAB — GLUCOSE, CAPILLARY
Glucose-Capillary: 106 mg/dL — ABNORMAL HIGH (ref 70–99)
Glucose-Capillary: 95 mg/dL (ref 70–99)
Glucose-Capillary: 121 mg/dL — ABNORMAL HIGH (ref 70–99)
Glucose-Capillary: 133 mg/dL — ABNORMAL HIGH (ref 70–99)
Glucose-Capillary: 129 mg/dL — ABNORMAL HIGH (ref 70–99)

## 2021-03-04 MED ORDER — CHLORHEXIDINE GLUCONATE CLOTH 2 % EX PADS
6.0000 | MEDICATED_PAD | Freq: Every day | CUTANEOUS | Status: DC
  Administered 2021-03-04 – 2021-03-06 (×3): 6 via TOPICAL

## 2021-03-04 MED ORDER — ONDANSETRON HCL 4 MG/2ML IJ SOLN
4.0000 mg | Freq: Four times a day (QID) | INTRAMUSCULAR | Status: DC | PRN
  Administered 2021-03-04: 4 mg via INTRAVENOUS
  Filled 2021-03-04: qty 2

## 2021-03-04 MED ORDER — LABETALOL HCL 5 MG/ML IV SOLN
20.0000 mg | INTRAVENOUS | Status: DC | PRN
  Administered 2021-03-04 – 2021-03-10 (×6): 20 mg via INTRAVENOUS
  Filled 2021-03-04 (×7): qty 4

## 2021-03-04 NOTE — Progress Notes (Signed)
PROGRESS NOTE    Yates Weisgerber  WUJ:[id number] DOB: 11-25-1961 DOA: 02/21/2021 PCP: Patient, No Pcp Per (Inactive)    Chief Complaint  Patient presents with  . Weakness    Brief Narrative:  60 year old gentleman prior history of hypertension, cocaine use, tobacco abuse admitted on 02/21/2021 with a code stroke was found to have intracranial hemorrhage in the right basal ganglia secondary to hypertension and cocaine use.  Patient was transferred from neurology service to hospitalist service on 02/26/2021. Therapy evaluations recommending SNF. Overnight, pt had CT head showing increased size of the intraparenchymal hematoma with worsening leftward midline shift, now measuring 8 mm.Effaced right lateral ventricle, which may become entrapped and predisposes to hydrocephalus. He was transferred to ICU overnight, neuro surgery consulted and a repeat CT head ordered which was essentially stable .  Pt denies any new complaints.    Assessment & Plan:   Active Problems:   ICH (intracerebral hemorrhage) ([place])   Essential hypertension   Tobacco abuse   Hyponatremia   Leukocytosis   Controlled type 2 diabetes mellitus with hyperglycemia, without long-term current use of insulin (H[facility])  Intracranial hemorrhage with 1-2 mm leftward shift Optimal Blood pressure control, keep sbp <140/90 mmhg.  Echocardiogram showed left ventricle ejection fraction 65 to 70% with grade 1 diastolic dysfunction. LDL is 52. Speech evaluation recommending dysphagia 2 diet. Pt currently denies any headache, nausea or vomiting.    Mechanical fall on 03/03/21  Unclear if he hit his head.  A ct head without contrast was obtained, showed  increased size of the intraparenchymal hematoma with worsening leftward midline shift, now measuring 8 mm.Effaced right lateral ventricle, which may become entrapped and predisposes to hydrocephalus. He was transferred to ICU overnight, neuro surgery consulted and a repeat CT head  ordered which was essentially stable .  Pt transferred back to the floor.   Hypokalemia Replaced, repeat level wnl.   Hypertension Suboptimally controlled, Added hydralazine 25 mg TID, in addition to Norvasc 10 mg, clonidine 0.1 mg daily, isosorbide 15 mg daily, lisinopril 5 mg daily   Hyper lipidemia Continue with statin.   Type 2 diabetes mellitus uncontrolled with hyperglycemia Hemoglobin A1 c is 6.4. not on insuliin at home.  Continue with Metformin. CBG (last 3)  Recent Labs    03/04/21 0732 03/04/21 1209 03/04/21 1521  GLUCAP 95 121* 133*  Continue with SSI.     Hyponatremia Much improved to 134.  Probably secondary to poor oral intake. Urine sodium less than 10, evaluate for SIADH.  DC thiazide  Mild abdominal pain: abd film is negative for obstruction.   Mild elevation of liver enzymes.  [place] ABD ordered and is negative.   Therapy evaluation recommending SNF,    DVT prophylaxis: SCDs Code Status: Full code Family Communication: none at bedside.  Disposition:   Status is: Inpatient  Remains inpatient appropriate because:Inpatient level of care appropriate due to severity of illness   Dispo: The patient is from: Home              Anticipated d/c is to: SNF              Patient currently is not medically stable to d/c.   Difficult to place patient Yes       Consultants:   Neurology   Procedures: Echocardiogram  MRI of the brain  Antimicrobials: None  Subjective: No headache, blurry vision or dizziness.    Objective: Vitals:   03/04/21 1100 03/04/21 1200 03/04/21 1300 03/04/21 1400  BP:  125/89 114/76 132/75 118/84  Pulse: 64 63 63 68  Resp: 12 12 12 13   Temp:      TempSrc:      SpO2: 96% 94% 94% 95%  Weight:      Height:        Intake/Output Summary (Last 24 hours) at 03/04/2021 1539 Last data filed at 03/04/2021 1400 Gross per 24 hour  Intake 600 ml  Output 750 ml  Net -150 ml   Filed Weights   02/25/21 1357  Weight:  73.2 kg    Examination:  General exam: Alert and comfortable not in distress Respiratory system: Clear to auscultation no wheezing or rhonchi Cardiovascular system: S1-S2 heard, regular rate rhythm, no JVD Gastrointestinal system: Abdomen is soft nontender nondistended bowel sounds are normal Central nervous system: Alert, following commands and answering simple questions Extremities: No pedal edema Skin: No rashes seen Psychiatry:  Mood is appropriate.     Data Reviewed: I have personally reviewed following labs and imaging studies  CBC: Recent Labs  Lab 02/26/21 0254 02/27/21 0127 02/28/21 0505 03/01/21 0657 03/02/21 0335  WBC 7.3 6.6 7.1 7.5 9.7  NEUTROABS  --   --   --   --  7.3  HGB 15.3 15.2 14.2 14.3 15.0  HCT 43.8 45.8 43.2 42.6 45.2  MCV 84.7 86.1 88.3 86.8 85.8  PLT 300 227 300 326 594    Basic Metabolic Panel: Recent Labs  Lab 02/26/21 0254 02/27/21 0127 02/28/21 0505 03/02/21 0335 03/03/21 0910  NA 127* 131* 134* 130* 134*  K 3.1* 4.3 4.2 3.2* 4.0  CL 91* 98 101 96* 99  CO2 25 26 26 25 23   GLUCOSE 114* 116* 118* 139* 120*  BUN 20 21* 16 13 10   CREATININE 1.21 1.25* 1.09 1.02 1.00  CALCIUM 8.7* 8.7* 8.7* 9.1 9.3  MG 2.3 2.5*  --   --   --   PHOS  --   --  2.7  --   --     GFR: Estimated Creatinine Clearance: 79.5 mL/min (by C-G formula based on SCr of 1 mg/dL).  Liver Function Tests: Recent Labs  Lab 02/26/21 0254 02/27/21 0127 02/28/21 0505  AST 53* 51*  --   ALT 50* 56*  --   ALKPHOS 39 40  --   BILITOT 1.4* 1.1  --   PROT 6.5 6.4*  --   ALBUMIN 3.3* 3.3* 2.9*    CBG: Recent Labs  Lab 03/03/21 2338 03/04/21 0321 03/04/21 0732 03/04/21 1209 03/04/21 1521  GLUCAP 191* 106* 95 121* 133*     No results found for this or any previous visit (from the past 240 hour(s)).       Radiology Studies: CT HEAD WO CONTRAST  Result Date: 03/04/2021 CLINICAL DATA:  Follow-up intraparenchymal hemorrhage EXAM: CT HEAD WITHOUT  CONTRAST TECHNIQUE: Contiguous axial images were obtained from the base of the skull through the vertex without intravenous contrast. COMPARISON:  03/03/2021 and multiple previous FINDINGS: Brain: Intraparenchymal hematoma with its epicenter in the right basal ganglia measures 5.9 x 3.8 x 4.4 cm, unchanged when measured using the same technique. Surrounding edema appears the same. Mass-effect with right-to-left midline shift of 8 mm appears the same. Compression of the right lateral ventricle. No evidence of frank ventricular trapping on the left. Elsewhere, chronic small-vessel ischemic changes are seen affecting the cerebral hemispheric white matter Vascular: There is atherosclerotic calcification of the major vessels at the base of the brain. Skull: Negative Sinuses/Orbits: Mucosal thickening and  retention cysts in the maxillary sinuses. Orbits negative. Other: None IMPRESSION: No change. Intraparenchymal hematoma with its epicenter in the right basal ganglia measures 5.9 x 3.8 x 4.4 cm (volume = 52 cm^3), unchanged when measured using the same technique. Surrounding edema appears the same. Mass-effect with right-to-left midline shift of 8 mm appears the same. No evidence of frank ventricular trapping on the left. Electronically Signed   By: Nelson Chimes M.D.   On: 03/04/2021 15:26   CT HEAD WO CONTRAST  Result Date: 03/03/2021 CLINICAL DATA:  Intracranial hemorrhage follow up EXAM: CT HEAD WITHOUT CONTRAST TECHNIQUE: Contiguous axial images were obtained from the base of the skull through the vertex without intravenous contrast. COMPARISON:  Head CT 02/22/2021 FINDINGS: Brain: Intraparenchymal hematoma has expanded to 5.8 x 3.6 cm, previously 5.5 x 2.8 cm. Leftward midline shift has also worsened and now measures 8 mm, previously 1 mm. The right lateral ventricle is effaced. Vascular: No hyperdense vessel or unexpected calcification. Skull: Normal. Negative for fracture or focal lesion. Sinuses/Orbits:  Bilateral maxillary sinus mucosal thickening. Normal orbits. Other: None IMPRESSION: 1. Increased size of 5.8 x 3.6 cm right intraparenchymal hematoma with worsening leftward midline shift, now measuring 8 mm. 2. Effaced right lateral ventricle, which may become entrapped and predisposes to hydrocephalus. These results will be called to the ordering clinician or representative by the Radiologist Assistant, and communication documented in the PACS or [employer]. Electronically Signed   By: Ulyses Jarred M.D.   On: 03/03/2021 22:46   US Abdomen Complete  Result Date: 03/03/2021 CLINICAL DATA:  Abdominal pain. EXAM: ABDOMEN ULTRASOUND COMPLETE COMPARISON:  CT 09/30/2020. FINDINGS: Gallbladder: No gallstones or wall thickening visualized. No sonographic Murphy sign noted by sonographer. Common bile duct: Diameter: 2.5 mm Liver: No focal lesion identified. Within normal limits in parenchymal echogenicity. Portal vein is patent on color Doppler imaging with normal direction of blood flow towards the liver. IVC: No abnormality visualized. Pancreas: Visualized portion unremarkable. Spleen: Patient refused to complete exam spleen not imaged. Right Kidney: Length: 10.4 cm. Echogenicity within normal limits. No mass or hydronephrosis visualized. Left Kidney: Length: Patient refused to complete exam, left kidney not imaged. Abdominal aorta: No aneurysm visualized. Other findings: None. IMPRESSION: 1. Patient refused to complete exam. Spleen and left kidney not imaged. 2. No acute abnormality identified. No gallstones or biliary distention. Electronically Signed   By: [place]   On: 03/03/2021 11:35        Scheduled Meds: . amLODipine  10 mg Oral Daily  . atorvastatin  20 mg Oral Daily  . Chlorhexidine Gluconate Cloth  6 each Topical Daily  . cloNIDine  0.1 mg Oral Daily  . enoxaparin (LOVENOX) injection  40 mg Subcutaneous Q24H  . feeding supplement  1 Container Oral TID BM  . hydrALAZINE  25 mg  Oral Q8H  . influenza vac split quadrivalent PF  0.5 mL Intramuscular Tomorrow-1000  . insulin aspart  0-9 Units Subcutaneous Q4H  . isosorbide mononitrate  15 mg Oral Daily  . lisinopril  10 mg Oral Daily  . metFORMIN  500 mg Oral Q breakfast  . pantoprazole  40 mg Oral QHS  . polyethylene glycol  17 g Oral Daily  . senna-docusate  1 tablet Oral BID   Continuous Infusions:   LOS: 11 days        Hosie Poisson, MD [facility]   To contact the attending provider between 7A-7P or the covering provider during after hours 7P-7A, please log  into the web site [email or web address] and access using universal [place] password for that web site. If you do not have the password, please call the hospital operator.  03/04/2021, 3:39 PM

## 2021-03-04 NOTE — Progress Notes (Addendum)
  NEUROSURGERY PROGRESS NOTE   No issues overnight.  Requesting breakfast this am  EXAM:  BP 131/84   Pulse 86   Temp 99.2 F (37.3 C) (Oral)   Resp 16   Ht 5\' 9"  (1.753 m)   Wt 73.2 kg   SpO2 98%   BMI 23.83 kg/m   Awake, alert, oriented x3 Left facial droop Left hemiplegia Right side essentially normal  IMPRESSION/PLAN 60 y.o. male admitted with right basal ganglia ICH with resultant left hemiplegia, now >10 days out, who underwent head CT yesterday evening for evaluation of a fall earlier today. Head CT revealed increased size of right basal ganglia hematoma with worsening MLS. He remains neurologically at baseline. Suspect this is just normal evolution of ICH. Will repeat CT today. If stable, okay to send back to the floor. Remains no role for NS intervention at present.    I have seen and examined Chad Coleman and agree with the exam, impression, and plan as documented in the note by Ferne Reus, PA-C.  10d s/p right ganglionic hemorrhage and recent fall with imaging demonstrating enlargement of hematoma and associated edema with increase in R->L MLS. Remains neurologically at baseline. With his stable exam, I do not see any role for surgical intervention. Would maintain SBP < 162mmHg and repeat CTH today. If stable, can transfer back to floor.  Consuella Lose, MD [facility]

## 2021-03-04 NOTE — Evaluation (Signed)
Speech Language Pathology Evaluation Patient Details Name: Chad Coleman MRN: [id number] DOB: 28-Nov-1960 Today's Date: 03/04/2021 Time: 2778-2423 SLP Time Calculation (min) (ACUTE ONLY): 14 min  Problem List:  Patient Active Problem List   Diagnosis Date Noted  . Essential hypertension   . Tobacco abuse   . Hyponatremia   . Leukocytosis   . Controlled type 2 diabetes mellitus with hyperglycemia, without long-term current use of insulin ([place])   . ICH (intracerebral hemorrhage) ([place]) 02/21/2021   Past Medical History:  Past Medical History:  Diagnosis Date  . Hypertension    Past Surgical History: No past surgical history on file. HPI:  60 y.o. male with a medical history significant for hypertension, tobacco use, noncompliance with antihypertensive medications, and cocaine abuse who presented to the ED via EMS as a Code Stroke for evaluation of acute onset of left-sided weakness, numbness, and left facial droop. Dx right basal ganglia ICH. Evening of 4/7 pt fell from bed with CT revealing Increased size of 5.8 x 3.6 cm right intraparenchymal hematoma  with worsening leftward midline shift and eval. Cognitive re-assessment ordered.   Assessment / Plan / Recommendation Clinical Impression  Overall, his speech-language-cognitive function appears similar to assessment on 3/29 with exception of working memory. Severity of dysarthria was unchanged and is mild-mod in conversation. Expressive and receptive language is functional. On SLUMS assessment he scored a 15 out of possible 30 requiring cues to retrieve 3/5 on word recall, difficulty with mental calculations, accurate with 1 of 4 questions for auditory recall of paragraph. ST will continue treatment to increase independence.    SLP Assessment  SLP Recommendation/Assessment: Patient needs continued Speech Lanaguage Pathology Services SLP Visit Diagnosis: Cognitive communication deficit (R41.841);Dysarthria and anarthria (R47.1)     Follow Up Recommendations  Skilled Nursing facility    Frequency and Duration min 2x/week  2 weeks      SLP Evaluation Cognition  Overall Cognitive Status:  (similar to SLE 02/22/21) Arousal/Alertness: Awake/alert Orientation Level: Oriented X4 Attention: Sustained Sustained Attention: Appears intact Selective Attention: Impaired Selective Attention Impairment: Verbal basic Memory: Impaired Memory Impairment: Retrieval deficit Awareness: Impaired Awareness Impairment: Intellectual impairment;Anticipatory impairment;Emergent impairment Problem Solving: Impaired Problem Solving Impairment: Verbal basic Behaviors: Impulsive Safety/Judgment: Impaired       Comprehension  Auditory Comprehension Overall Auditory Comprehension: Appears within functional limits for tasks assessed Visual Recognition/Discrimination Discrimination: Not tested Reading Comprehension Reading Status: Not tested    Expression Expression Primary Mode of Expression: Verbal Verbal Expression Overall Verbal Expression: Appears within functional limits for tasks assessed Written Expression Dominant Hand:  (told this SLP was right handed as well) Written Expression: Not tested   Oral / Motor  Oral Motor/Sensory Function Overall Oral Motor/Sensory Function: Mild impairment Facial ROM: Reduced left;Suspected CN VII (facial) dysfunction Facial Symmetry: Abnormal symmetry left;Suspected CN VII (facial) dysfunction Facial Sensation: Reduced left;Suspected CN V (Trigeminal) dysfunction Lingual Symmetry: Within Functional Limits Motor Speech Overall Motor Speech: Impaired Respiration: Within functional limits Level of Impairment: Conversation Phonation: Normal Resonance: Within functional limits Articulation: Impaired Level of Impairment: Sentence Intelligibility: Intelligible Word: 75-100% accurate Phrase: 75-100% accurate Sentence: 75-100% accurate Conversation: 75-100% accurate Motor Planning:  Witnin functional limits   GO                    Chad Coleman 03/04/2021, 5:07 PM  Chad Coleman M.Ed Risk analyst [phone] Office [phone]

## 2021-03-04 NOTE — Progress Notes (Signed)
Report called to [facility] on unit 4n ICU .Patient to transfer to 4 N ICU bed 22.

## 2021-03-04 NOTE — Progress Notes (Signed)
This nurse heard bed alarm sound from patient and rushed to the room, patient found on the floor. This fall was unwitnessed by the staff. All prevention protocol was in place to prevent this fall, but due to agitation, patient would not follow commands. Dr. Karleen Hampshire notified of patient fall. CT scan of head was ordered. Staff continues to monitor patient often.  No distress noted at this time, patient denies pain at this time. Will continue to monitor.

## 2021-03-04 NOTE — Progress Notes (Addendum)
Occupational Therapy Treatment Patient Details Name: Chad Coleman MRN: [id number] DOB: 23-Jul-1961 Today's Date: 03/04/2021    History of present illness 60 yo male presents to [facility] on 3/28 with acute onset of L weakness, numbness, and L facial droop. CTH R BG ICH, suspect hypertensive.  Fall on 02/28/21. imaging showing increased in R intraparenchymal hematoma with worsening leftward midline shift on 4/7. PMH includes hypertension, tobacco use, noncompliance with antihypertensive medications, and cocaine abuse.   OT comments  Pt continues to present decreased balance, cognition, and functional use of LUE. Upon arrival, pt performing self feeding tasks in bed. Optimizing upright posture and providing Min A for managing bowl as pt use RUE to manage spoon and eat breakfast. Cues for slowing down and attending to task as pt impulsively moving and dropping food on himself. Pt performing sitting balance tasks at EOB with visual aide for midline; fluctuating between needing Min A to Max A for postural corrections. Use of stedy for practicing standing balance (use of mirror at sink as visual aid). Pt benefiting from direct, simple cues for sequencing and problem solving balance deficits. Continue to recommend dc to CIR for intensive OT and will continue to follow acutely as admitted.    Follow Up Recommendations  CIR    Equipment Recommendations   (tbd)    Recommendations for Other Services PT consult;Rehab consult;Speech consult    Precautions / Restrictions Precautions Precautions: Fall Precaution Comments: Pusher, L neglect, impulsive Restrictions Weight Bearing Restrictions: No       Mobility Bed Mobility Overal bed mobility: Needs Assistance Bed Mobility: Supine to Sit;Sit to Supine     Supine to sit: Mod assist;HOB elevated;+2 for safety/equipment Sit to supine: Mod assist;+2 for physical assistance   General bed mobility comments: Mod assist for trunk elevation via HHA, immediate  assist to correct L lateral leaning, and scooting to EOB. Increased time and effort.    Transfers Overall transfer level: Needs assistance Equipment used: 2 person hand held assist Transfers: Sit to/from [facility] Sit to Stand: Min assist;Mod assist;+2 physical assistance         General transfer comment: MIn A +2 for power up and then Mod A +2 for correcting balance with left lateral lean    Balance Overall balance assessment: Needs assistance Sitting-balance support: Single extremity supported;Feet supported Sitting balance-Leahy Scale: Poor Sitting balance - Comments: Pt with left lateral lean and pushing. Providing visual cues and then using mirror for pt to correct posture. Flucuating between Max A for support and MIn A +2 Postural control: Left lateral lean Standing balance support: Bilateral upper extremity supported Standing balance-Leahy Scale: Poor Standing balance comment: Reliant on UE support and physical A for postural correction                           ADL either performed or assessed with clinical judgement   ADL Overall ADL's : Needs assistance/impaired Eating/Feeding: Minimal assistance;Bed level Eating/Feeding Details (indicate cue type and reason): Optimizing upright posture in bed with elevating HOB. Providing support and placing bowl in lap with MIn A to stabilize as pt use R hand to scoop and manage spoon. Cues for decreasing impulsivity as pt eating quickly and making a mess with no awareness Grooming: Minimal assistance;Bed level;Wash/dry face                   Toilet Transfer: Moderate assistance;+2 for physical assistance Toilet Transfer Details (indicate cue type and  reason): Min A for power up into standing and then Mod A +2 for repositioning as pt with left lateral leaning in sitting and standing.         Functional mobility during ADLs: Moderate assistance;+2 for physical assistance (sit<>stand in  stedy) General ADL Comments: Pt performing self feeding and washing his face in bed. focused session on balance, body awareness, and impulsivity.     Vision   Vision Assessment?: Vision impaired- to be further tested in functional context Eye Alignment: Impaired (comment) Alignment/Gaze Preference: Gaze right;Head turned Tracking/Visual Pursuits: Impaired - to be further tested in functional context   Perception     Praxis      Cognition Arousal/Alertness: Awake/alert Behavior During Therapy: Flat affect Overall Cognitive Status: Impaired/Different from baseline Area of Impairment: Attention;Memory;Following commands;Safety/judgement;Awareness;Problem solving                   Current Attention Level: Sustained (short time) Memory: Decreased short-term memory;Decreased recall of precautions Following Commands: Follows one step commands consistently Safety/Judgement: Decreased awareness of safety;Decreased awareness of deficits Awareness: Intellectual Problem Solving: Slow processing;Decreased initiation;Requires verbal cues;Requires tactile cues;Difficulty sequencing General Comments: Pt impulsive and requiring simple, direct cues. Follow one step commands with calm environement. At end of session, providsing questioning cues to have pt review session, pt abel to report what he did and why with Mod questioning cues        Exercises Exercises: Other exercises Other Exercises Other Exercises: Lateral leaning both to L and R. Weight bearing onto L elbow. Other Exercises: Righting reaction and sitting balance. Providing midline visual cue. Pt flucuating between Max A and Min A for postural correction; pending fatigue and attention   Shoulder Instructions       General Comments VSS    Pertinent Vitals/ Pain       Pain Assessment: Faces Faces Pain Scale: Hurts little more Pain Location: R shoulder Pain Descriptors / Indicators: Grimacing;Guarding Pain Intervention(s):  Monitored during session;Limited activity within patient's tolerance;Repositioned  Home Living                                          Prior Functioning/Environment              Frequency  Min 2X/week        Progress Toward Goals  OT Goals(current goals can now be found in the care plan section)  Progress towards OT goals: Progressing toward goals  Acute Rehab OT Goals Patient Stated Goal: go home and he states that he is ready to do that now OT Goal Formulation: With patient Time For Goal Achievement: 03/08/21 Potential to Achieve Goals: Good ADL Goals Pt Will Perform Grooming: with min assist;sitting Pt Will Perform Upper Body Bathing: with min assist;sitting Pt Will Transfer to Toilet: with min assist;with +2 assist;bedside commode;stand pivot transfer Additional ADL Goal #1: Pt will perform bed mobility with Min A in preparation for ADLs Additional ADL Goal #2: Pt will tolerate sitting at EOB for 10 minutes with Min A in preparation for ADLs  Plan Discharge plan remains appropriate    Co-evaluation    PT/OT/SLP Co-Evaluation/Treatment: Yes Reason for Co-Treatment: For patient/therapist safety;To address functional/ADL transfers   OT goals addressed during session: ADL's and self-care      AM-PAC OT "6 Clicks" Daily Activity     Outcome Measure   Help from another person eating meals?:  A Lot Help from another person taking care of personal grooming?: A Lot Help from another person toileting, which includes using toliet, bedpan, or urinal?: Total Help from another person bathing (including washing, rinsing, drying)?: Total Help from another person to put on and taking off regular upper body clothing?: Total Help from another person to put on and taking off regular lower body clothing?: Total 6 Click Score: 8    End of Session Equipment Utilized During Treatment: Gait belt  OT Visit Diagnosis: Unsteadiness on feet (R26.81);Other  abnormalities of gait and mobility (R26.89);Muscle weakness (generalized) (M62.81);Hemiplegia and hemiparesis Hemiplegia - Right/Left: Left Hemiplegia - dominant/non-dominant: Dominant Hemiplegia - caused by: Cerebral infarction   Activity Tolerance Patient tolerated treatment well   Patient Left in bed;with call bell/phone within reach;with bed alarm set;with family/visitor present   Nurse Communication Mobility status        Time: 8638-1771 OT Time Calculation (min): 31 min  Charges: OT General Charges $OT Visit: 1 Visit OT Treatments $Self Care/Home Management : 8-22 mins  [place], OTR/L Acute Rehab Pager: [phone] Office: [place] 03/04/2021, 9:52 AM

## 2021-03-04 NOTE — Progress Notes (Signed)
Physical Therapy Treatment Patient Details Name: Chad Coleman MRN: [id number] DOB: November 05, 1961 Today's Date: 03/04/2021    History of Present Illness 60 yo male presents to [facility] on 3/28 with acute onset of L weakness, numbness, and L facial droop. CTH R BG ICH, suspect hypertensive.  Fall on 02/28/21. imaging showing increased in R intraparenchymal hematoma with worsening leftward midline shift on 4/7. PMH includes hypertension, tobacco use, noncompliance with antihypertensive medications, and cocaine abuse.    PT Comments    The pt was seen by PT/OT to safely progress functional stability, postural awareness, and endurance this session. The pt continues to present with significant L lateral lean with poor ability to recognize movement until after cued or pt has sustained LOB. When given short, simple, and at times multimodal cues, the pt is able to maintain midline positioning in both seated and standing positions, but requires repeated cues to sustain for more than a few seconds at a time. Min-modA of 2 is required to maintain safe seated or standing positions at this time with use of stedy. The pt will continue to benefit from skilled PT to progress functional strength, awareness, and stability with less cues/assist.     Follow Up Recommendations  Supervision/Assistance - 24 hour;SNF     Equipment Recommendations  Other (comment) (defer to post acute)    Recommendations for Other Services       Precautions / Restrictions Precautions Precautions: Fall Precaution Comments: Pusher, L neglect, impulsive Restrictions Weight Bearing Restrictions: No    Mobility  Bed Mobility Overal bed mobility: Needs Assistance Bed Mobility: Supine to Sit;Sit to Supine     Supine to sit: Mod assist;HOB elevated;+2 for safety/equipment Sit to supine: Mod assist;+2 for physical assistance   General bed mobility comments: Mod assist for trunk elevation via HHA, immediate assist to correct L lateral  leaning, and scooting to EOB. Increased time and effort.    Transfers Overall transfer level: Needs assistance Equipment used: 2 person hand held assist Transfers: Sit to/from [facility] Sit to Stand: Min assist;Mod assist;+2 physical assistance         General transfer comment: MIn A +2 for power up and then Mod A +2 for correcting balance with left lateral lean  Ambulation/Gait             General Gait Details: deferred to practice standing balance      Modified Rankin (Stroke Patients Only) Modified Rankin (Stroke Patients Only) Pre-Morbid Rankin Score: No symptoms Modified Rankin: Severe disability     Balance Overall balance assessment: Needs assistance Sitting-balance support: Single extremity supported;Feet supported Sitting balance-Leahy Scale: Poor Sitting balance - Comments: Pt with left lateral lean and pushing. Providing visual cues and then using mirror for pt to correct posture. Flucuating between Max A for support and MIn A +2 Postural control: Left lateral lean Standing balance support: Bilateral upper extremity supported Standing balance-Leahy Scale: Poor Standing balance comment: Reliant on UE support and physical A for postural correction                            Cognition Arousal/Alertness: Awake/alert Behavior During Therapy: Flat affect Overall Cognitive Status: Impaired/Different from baseline Area of Impairment: Attention;Memory;Following commands;Safety/judgement;Awareness;Problem solving                   Current Attention Level: Sustained (short duration) Memory: Decreased short-term memory;Decreased recall of precautions Following Commands: Follows one step commands consistently Safety/Judgement: Decreased awareness  of safety;Decreased awareness of deficits Awareness: Intellectual Problem Solving: Slow processing;Decreased initiation;Requires verbal cues;Requires tactile cues;Difficulty  sequencing General Comments: Pt impulsive and requiring simple, direct cues. Follow one step commands with calm environement. At end of session, providsing questioning cues to have pt review session, pt abel to report what he did and why with Mod questioning cues      Exercises Other Exercises Other Exercises: Lateral leaning both to L and R. Weight bearing onto L elbow. Other Exercises: Righting reaction and sitting balance. Providing midline visual cue. Pt flucuating between Max A and Min A for postural correction; pending fatigue and attention    General Comments General comments (skin integrity, edema, etc.): VSS      Pertinent Vitals/Pain Pain Assessment: Faces Faces Pain Scale: Hurts little more Pain Location: R shoulder Pain Descriptors / Indicators: Grimacing;Guarding Pain Intervention(s): Limited activity within patient's tolerance;Monitored during session           PT Goals (current goals can now be found in the care plan section) Acute Rehab PT Goals Patient Stated Goal: go home and he states that he is ready to do that now PT Goal Formulation: With patient Time For Goal Achievement: 03/08/21 Potential to Achieve Goals: Good Progress towards PT goals: Progressing toward goals    Frequency    Min 3X/week      PT Plan Current plan remains appropriate    Co-evaluation PT/OT/SLP Co-Evaluation/Treatment: Yes Reason for Co-Treatment: Complexity of the patient's impairments (multi-system involvement);Necessary to address cognition/behavior during functional activity;For patient/therapist safety;To address functional/ADL transfers PT goals addressed during session: Mobility/safety with mobility;Balance OT goals addressed during session: ADL's and self-care      AM-PAC PT "6 Clicks" Mobility   Outcome Measure  Help needed turning from your back to your side while in a flat bed without using bedrails?: A Lot Help needed moving from lying on your back to sitting  on the side of a flat bed without using bedrails?: A Lot Help needed moving to and from a bed to a chair (including a wheelchair)?: Total Help needed standing up from a chair using your arms (e.g., wheelchair or bedside chair)?: A Lot Help needed to walk in hospital room?: Total Help needed climbing 3-5 steps with a railing? : Total 6 Click Score: 9    End of Session Equipment Utilized During Treatment: Gait belt Activity Tolerance: Patient limited by fatigue Patient left: in bed;with call bell/phone within reach;with bed alarm set;with nursing/sitter in room Nurse Communication: Mobility status PT Visit Diagnosis: Hemiplegia and hemiparesis;Unsteadiness on feet (R26.81);Muscle weakness (generalized) (M62.81);Difficulty in walking, not elsewhere classified (R26.2);Other symptoms and signs involving the nervous system (R29.898) Hemiplegia - Right/Left: Left Hemiplegia - dominant/non-dominant: Dominant Hemiplegia - caused by: Nontraumatic intracerebral hemorrhage     Time: 3785-8850 PT Time Calculation (min) (ACUTE ONLY): 31 min  Charges:  $Neuromuscular Re-education: 8-22 mins                     Hardie Pulley, DPT   Acute Rehabilitation Department Pager #: [phone]   Otho Bellows 03/04/2021, 10:44 AM

## 2021-03-05 LAB — GLUCOSE, CAPILLARY
Glucose-Capillary: 103 mg/dL — ABNORMAL HIGH (ref 70–99)
Glucose-Capillary: 108 mg/dL — ABNORMAL HIGH (ref 70–99)
Glucose-Capillary: 112 mg/dL — ABNORMAL HIGH (ref 70–99)
Glucose-Capillary: 123 mg/dL — ABNORMAL HIGH (ref 70–99)
Glucose-Capillary: 153 mg/dL — ABNORMAL HIGH (ref 70–99)
Glucose-Capillary: 167 mg/dL — ABNORMAL HIGH (ref 70–99)
Glucose-Capillary: 175 mg/dL — ABNORMAL HIGH (ref 70–99)
Glucose-Capillary: 97 mg/dL (ref 70–99)

## 2021-03-05 MED ORDER — TRAMADOL HCL 50 MG PO TABS
100.0000 mg | ORAL_TABLET | Freq: Four times a day (QID) | ORAL | Status: DC | PRN
  Administered 2021-03-06 – 2021-03-07 (×2): 100 mg via ORAL
  Filled 2021-03-05 (×2): qty 2

## 2021-03-05 MED ORDER — LORAZEPAM 2 MG/ML IJ SOLN
1.0000 mg | Freq: Once | INTRAMUSCULAR | Status: AC
  Administered 2021-03-05: 1 mg via INTRAVENOUS
  Filled 2021-03-05: qty 1

## 2021-03-05 NOTE — Progress Notes (Signed)
PROGRESS NOTE    Chad Coleman  MVE:[id number] DOB: March 07, 1961 DOA: 02/21/2021 PCP: Patient, No Pcp Per (Inactive)    Chief Complaint  Patient presents with  . Weakness    Brief Narrative:  60 year old gentleman prior history of hypertension, cocaine use, tobacco abuse admitted on 02/21/2021 with a code stroke was found to have intracranial hemorrhage in the right basal ganglia secondary to hypertension and cocaine use.  Patient was transferred from neurology service to hospitalist service on 02/26/2021. Therapy evaluations recommending SNF. Overnight, pt had CT head showing increased size of the intraparenchymal hematoma with worsening leftward midline shift, now measuring 8 mm.Effaced right lateral ventricle, which may become entrapped and predisposes to hydrocephalus. He was transferred to ICU overnight, neuro surgery consulted and a repeat CT head ordered which was essentially stable .  Pt seen and examined, he is currently on the floor and appears stable.  He is requesting to eat pancackes. .  No chest pain or sob. No headaches today.    Assessment & Plan:   Active Problems:   ICH (intracerebral hemorrhage) ([place])   Essential hypertension   Tobacco abuse   Hyponatremia   Leukocytosis   Controlled type 2 diabetes mellitus with hyperglycemia, without long-term current use of insulin (HCC)  Intracranial hemorrhage with 1-2 mm leftward shift Optimal Blood pressure control, keep sbp <140/90 mmhg.  Echocardiogram showed left ventricle ejection fraction 65 to 70% with grade 1 diastolic dysfunction. LDL is 52. Speech evaluation recommending dysphagia 2 diet. Repeat CT head shows stable hemorrhages.  No headache or vomiting.    Mechanical fall on 03/03/21  Unclear if he hit his head.  A ct head without contrast was obtained, showed  increased size of the intraparenchymal hematoma with worsening leftward midline shift, now measuring 8 mm.Effaced right lateral ventricle, which may  become entrapped and predisposes to hydrocephalus. He was transferred to ICU overnight, neuro surgery consulted and a repeat CT head ordered which was essentially stable .  Pt transferred back to the floor.   Hypokalemia Replaced, repeat level wnl.   Hypertension Better controlled today.  Added hydralazine 25 mg TID, in addition to Norvasc 10 mg, clonidine 0.1 mg daily, isosorbide 15 mg daily, lisinopril 5 mg daily   Hyper lipidemia Continue with statin.   Type 2 diabetes mellitus uncontrolled with hyperglycemia Hemoglobin A1 c is 6.4. not on insuliin at home.  Continue with Metformin. CBG (last 3)  Recent Labs    03/05/21 0330 03/05/21 0738 03/05/21 1126  GLUCAP 97 153* 175*  Continue with SSI.  No changes in meds.     Hyponatremia Much improved to 134.  Probably secondary to poor oral intake. Urine sodium less than 10, evaluate for SIADH.  DC thiazide  Mild abdominal pain: abd film is negative for obstruction.   Mild elevation of liver enzymes.  [place] ABD ordered and is negative.   Therapy evaluation recommending SNF,    DVT prophylaxis: SCDs Code Status: Full code Family Communication: none at bedside.  Disposition:   Status is: Inpatient  Remains inpatient appropriate because:Inpatient level of care appropriate due to severity of illness   Dispo: The patient is from: Home              Anticipated d/c is to: SNF              Patient currently is not medically stable to d/c.   Difficult to place patient Yes       Consultants:   Neurology  Procedures: Echocardiogram  MRI of the brain  Antimicrobials: None  Subjective: No nausea, vomiting or headache.    Objective: Vitals:   03/05/21 0416 03/05/21 0525 03/05/21 0830 03/05/21 1140  BP: (!) 151/93 (!) 142/92 (!) 152/92 117/81  Pulse: 61 62 65 79  Resp: 18     Temp: 98.2 F (36.8 C)  98.7 F (37.1 C) 98.1 F (36.7 C)  TempSrc: Oral  Oral Oral  SpO2: 100%  97% 100%  Weight:       Height:        Intake/Output Summary (Last 24 hours) at 03/05/2021 1237 Last data filed at 03/04/2021 2036 Gross per 24 hour  Intake 240 ml  Output 450 ml  Net -210 ml   Filed Weights   02/25/21 1357  Weight: 73.2 kg    Examination:  General exam: alert and comfortable, no distress noted.  Respiratory system: air entry fair bilateral , no wheezing heard.  Cardiovascular system: S1-S2 heard,RRR, no JVD. No pedal edema.  Gastrointestinal system: Abdomen is soft NT ND BS+ Central nervous system: Alert,Oriented to place and person. Following simple commands.  Extremities: no edema.  Skin: No rashes seen Psychiatry:  Mood is appropriate.     Data Reviewed: I have personally reviewed following labs and imaging studies  CBC: Recent Labs  Lab 02/27/21 0127 02/28/21 0505 03/01/21 0657 03/02/21 0335  WBC 6.6 7.1 7.5 9.7  NEUTROABS  --   --   --  7.3  HGB 15.2 14.2 14.3 15.0  HCT 45.8 43.2 42.6 45.2  MCV 86.1 88.3 86.8 85.8  PLT 227 300 326 426    Basic Metabolic Panel: Recent Labs  Lab 02/27/21 0127 02/28/21 0505 03/02/21 0335 03/03/21 0910  NA 131* 134* 130* 134*  K 4.3 4.2 3.2* 4.0  CL 98 101 96* 99  CO2 26 26 25 23   GLUCOSE 116* 118* 139* 120*  BUN 21* 16 13 10   CREATININE 1.25* 1.09 1.02 1.00  CALCIUM 8.7* 8.7* 9.1 9.3  MG 2.5*  --   --   --   PHOS  --  2.7  --   --     GFR: Estimated Creatinine Clearance: 79.5 mL/min (by C-G formula based on SCr of 1 mg/dL).  Liver Function Tests: Recent Labs  Lab 02/27/21 0127 02/28/21 0505  AST 51*  --   ALT 56*  --   ALKPHOS 40  --   BILITOT 1.1  --   PROT 6.4*  --   ALBUMIN 3.3* 2.9*    CBG: Recent Labs  Lab 03/04/21 2029 03/05/21 0018 03/05/21 0330 03/05/21 0738 03/05/21 1126  GLUCAP 129* 167* 97 153* 175*     No results found for this or any previous visit (from the past 240 hour(s)).       Radiology Studies: CT HEAD WO CONTRAST  Result Date: 03/04/2021 CLINICAL DATA:  Follow-up  intraparenchymal hemorrhage EXAM: CT HEAD WITHOUT CONTRAST TECHNIQUE: Contiguous axial images were obtained from the base of the skull through the vertex without intravenous contrast. COMPARISON:  03/03/2021 and multiple previous FINDINGS: Brain: Intraparenchymal hematoma with its epicenter in the right basal ganglia measures 5.9 x 3.8 x 4.4 cm, unchanged when measured using the same technique. Surrounding edema appears the same. Mass-effect with right-to-left midline shift of 8 mm appears the same. Compression of the right lateral ventricle. No evidence of frank ventricular trapping on the left. Elsewhere, chronic small-vessel ischemic changes are seen affecting the cerebral hemispheric white matter Vascular: There is atherosclerotic  calcification of the major vessels at the base of the brain. Skull: Negative Sinuses/Orbits: Mucosal thickening and retention cysts in the maxillary sinuses. Orbits negative. Other: None IMPRESSION: No change. Intraparenchymal hematoma with its epicenter in the right basal ganglia measures 5.9 x 3.8 x 4.4 cm (volume = 52 cm^3), unchanged when measured using the same technique. Surrounding edema appears the same. Mass-effect with right-to-left midline shift of 8 mm appears the same. No evidence of frank ventricular trapping on the left. Electronically Signed   By: Nelson Chimes M.D.   On: 03/04/2021 15:26   CT HEAD WO CONTRAST  Result Date: 03/03/2021 CLINICAL DATA:  Intracranial hemorrhage follow up EXAM: CT HEAD WITHOUT CONTRAST TECHNIQUE: Contiguous axial images were obtained from the base of the skull through the vertex without intravenous contrast. COMPARISON:  Head CT 02/22/2021 FINDINGS: Brain: Intraparenchymal hematoma has expanded to 5.8 x 3.6 cm, previously 5.5 x 2.8 cm. Leftward midline shift has also worsened and now measures 8 mm, previously 1 mm. The right lateral ventricle is effaced. Vascular: No hyperdense vessel or unexpected calcification. Skull: Normal. Negative  for fracture or focal lesion. Sinuses/Orbits: Bilateral maxillary sinus mucosal thickening. Normal orbits. Other: None IMPRESSION: 1. Increased size of 5.8 x 3.6 cm right intraparenchymal hematoma with worsening leftward midline shift, now measuring 8 mm. 2. Effaced right lateral ventricle, which may become entrapped and predisposes to hydrocephalus. These results will be called to the ordering clinician or representative by the Radiologist Assistant, and communication documented in the PACS or [employer]. Electronically Signed   By: Ulyses Jarred M.D.   On: 03/03/2021 22:46        Scheduled Meds: . amLODipine  10 mg Oral Daily  . atorvastatin  20 mg Oral Daily  . Chlorhexidine Gluconate Cloth  6 each Topical Daily  . cloNIDine  0.1 mg Oral Daily  . feeding supplement  1 Container Oral TID BM  . hydrALAZINE  25 mg Oral Q8H  . influenza vac split quadrivalent PF  0.5 mL Intramuscular Tomorrow-1000  . insulin aspart  0-9 Units Subcutaneous Q4H  . isosorbide mononitrate  15 mg Oral Daily  . lisinopril  10 mg Oral Daily  . metFORMIN  500 mg Oral Q breakfast  . pantoprazole  40 mg Oral QHS  . polyethylene glycol  17 g Oral Daily  . senna-docusate  1 tablet Oral BID   Continuous Infusions:   LOS: 12 days        Hosie Poisson, MD [facility]   To contact the attending provider between 7A-7P or the covering provider during after hours 7P-7A, please log into the web site [email or web address] and access using universal [place] password for that web site. If you do not have the password, please call the hospital operator.  03/05/2021, 12:37 PM

## 2021-03-05 NOTE — Plan of Care (Signed)
  Problem: Health Behavior/Discharge Planning: Goal: Ability to manage health-related needs will improve Outcome: Progressing   Problem: Health Behavior/Discharge Planning: Goal: Ability to manage health-related needs will improve Outcome: Progressing   Problem: Coping: Goal: Will identify appropriate support needs Outcome: Progressing   Problem: Coping: Goal: Will verbalize positive feelings about self Outcome: Progressing

## 2021-03-05 NOTE — Progress Notes (Signed)
Subjective: Patient reports that he is doing well overall. He has complaints of a 7/10 headache that does not respond well to his current pain regimen. No acute events overnight.   Objective: Vital signs in last 24 hours: Temp:  [98.2 F (36.8 C)-99.1 F (37.3 C)] 98.7 F (37.1 C) (04/09 0830) Pulse Rate:  [28-82] 65 (04/09 0830) Resp:  [12-19] 18 (04/09 0416) BP: (114-176)/(75-102) 152/92 (04/09 0830) SpO2:  [81 %-100 %] 97 % (04/09 0830)  Intake/Output from previous day: 04/08 0701 - 04/09 0700 In: 480 [P.O.:480] Out: 450 [Urine:450] Intake/Output this shift: No intake/output data recorded.  Physical Exam: Patient is awake, A/O X 4, conversant, and in good spirits. Speech is fluent and appropriate. Doing well. RUE and RLE with spontaneous movement against gravity with no drift. Left-sided hemiplegia persists. Left-sided facial asymmetry.    Lab Results: No results for input(s): WBC, HGB, HCT, PLT in the last 72 hours. BMET Recent Labs    03/03/21 0910  NA 134*  K 4.0  CL 99  CO2 23  GLUCOSE 120*  BUN 10  CREATININE 1.00  CALCIUM 9.3    Studies/Results: CT HEAD WO CONTRAST  Result Date: 03/04/2021 CLINICAL DATA:  Follow-up intraparenchymal hemorrhage EXAM: CT HEAD WITHOUT CONTRAST TECHNIQUE: Contiguous axial images were obtained from the base of the skull through the vertex without intravenous contrast. COMPARISON:  03/03/2021 and multiple previous FINDINGS: Brain: Intraparenchymal hematoma with its epicenter in the right basal ganglia measures 5.9 x 3.8 x 4.4 cm, unchanged when measured using the same technique. Surrounding edema appears the same. Mass-effect with right-to-left midline shift of 8 mm appears the same. Compression of the right lateral ventricle. No evidence of frank ventricular trapping on the left. Elsewhere, chronic small-vessel ischemic changes are seen affecting the cerebral hemispheric white matter Vascular: There is atherosclerotic calcification of the  major vessels at the base of the brain. Skull: Negative Sinuses/Orbits: Mucosal thickening and retention cysts in the maxillary sinuses. Orbits negative. Other: None IMPRESSION: No change. Intraparenchymal hematoma with its epicenter in the right basal ganglia measures 5.9 x 3.8 x 4.4 cm (volume = 52 cm^3), unchanged when measured using the same technique. Surrounding edema appears the same. Mass-effect with right-to-left midline shift of 8 mm appears the same. No evidence of frank ventricular trapping on the left. Electronically Signed   By: Nelson Chimes M.D.   On: 03/04/2021 15:26   CT HEAD WO CONTRAST  Result Date: 03/03/2021 CLINICAL DATA:  Intracranial hemorrhage follow up EXAM: CT HEAD WITHOUT CONTRAST TECHNIQUE: Contiguous axial images were obtained from the base of the skull through the vertex without intravenous contrast. COMPARISON:  Head CT 02/22/2021 FINDINGS: Brain: Intraparenchymal hematoma has expanded to 5.8 x 3.6 cm, previously 5.5 x 2.8 cm. Leftward midline shift has also worsened and now measures 8 mm, previously 1 mm. The right lateral ventricle is effaced. Vascular: No hyperdense vessel or unexpected calcification. Skull: Normal. Negative for fracture or focal lesion. Sinuses/Orbits: Bilateral maxillary sinus mucosal thickening. Normal orbits. Other: None IMPRESSION: 1. Increased size of 5.8 x 3.6 cm right intraparenchymal hematoma with worsening leftward midline shift, now measuring 8 mm. 2. Effaced right lateral ventricle, which may become entrapped and predisposes to hydrocephalus. These results will be called to the ordering clinician or representative by the Radiologist Assistant, and communication documented in the PACS or [employer]. Electronically Signed   By: Ulyses Jarred M.D.   On: 03/03/2021 22:46   US Abdomen Complete  Result Date: 03/03/2021 CLINICAL DATA:  Abdominal pain. EXAM: ABDOMEN ULTRASOUND COMPLETE COMPARISON:  CT 09/30/2020. FINDINGS: Gallbladder: No gallstones  or wall thickening visualized. No sonographic Murphy sign noted by sonographer. Common bile duct: Diameter: 2.5 mm Liver: No focal lesion identified. Within normal limits in parenchymal echogenicity. Portal vein is patent on color Doppler imaging with normal direction of blood flow towards the liver. IVC: No abnormality visualized. Pancreas: Visualized portion unremarkable. Spleen: Patient refused to complete exam spleen not imaged. Right Kidney: Length: 10.4 cm. Echogenicity within normal limits. No mass or hydronephrosis visualized. Left Kidney: Length: Patient refused to complete exam, left kidney not imaged. Abdominal aorta: No aneurysm visualized. Other findings: None. IMPRESSION: 1. Patient refused to complete exam. Spleen and left kidney not imaged. 2. No acute abnormality identified. No gallstones or biliary distention. Electronically Signed   By: Marcello Moores  Register   On: 03/03/2021 11:35    Assessment/Plan: Patient is 60 y.o. male who is 11 days s/p basal ganglia ICH with resultant left hemiplegia. He continues to remain neurologically stable. Does have complaints of intractable headache. Due to the patient continuing to be stable, there is no neurosurgical intervention to offer this patient at this time. Recommend maintaining SBP < 155mmHg. Continue working on pain control.      LOS: 12 days     Marvis Moeller, DNP, NP-C 03/05/2021, 8:41 AM

## 2021-03-05 NOTE — Plan of Care (Signed)
  Problem: Coping: Goal: Will verbalize positive feelings about self Outcome: Progressing Goal: Will identify appropriate support needs Outcome: Progressing   Problem: Health Behavior/Discharge Planning: Goal: Ability to manage health-related needs will improve Outcome: Progressing   Problem: Self-Care: Goal: Ability to participate in self-care as condition permits will improve Outcome: Progressing Goal: Verbalization of feelings and concerns over difficulty with self-care will improve Outcome: Progressing Goal: Ability to communicate needs accurately will improve Outcome: Progressing   Problem: Nutrition: Goal: Risk of aspiration will decrease Outcome: Progressing Goal: Dietary intake will improve Outcome: Progressing   Problem: Intracerebral Hemorrhage Tissue Perfusion: Goal: Complications of Intracerebral Hemorrhage will be minimized Outcome: Progressing   Problem: Safety: Goal: Non-violent Restraint(s) Outcome: Progressing   Problem: Education: Goal: Knowledge of General Education information will improve Description: Including pain rating scale, medication(s)/side effects and non-pharmacologic comfort measures Outcome: Progressing   Problem: Health Behavior/Discharge Planning: Goal: Ability to manage health-related needs will improve Outcome: Progressing   Problem: Clinical Measurements: Goal: Ability to maintain clinical measurements within normal limits will improve Outcome: Progressing Goal: Will remain free from infection Outcome: Progressing Goal: Diagnostic test results will improve Outcome: Progressing Goal: Respiratory complications will improve Outcome: Progressing Goal: Cardiovascular complication will be avoided Outcome: Progressing   Problem: Activity: Goal: Risk for activity intolerance will decrease Outcome: Progressing   Problem: Nutrition: Goal: Adequate nutrition will be maintained Outcome: Progressing   Problem: Coping: Goal: Level  of anxiety will decrease Outcome: Progressing   Problem: Elimination: Goal: Will not experience complications related to bowel motility Outcome: Progressing Goal: Will not experience complications related to urinary retention Outcome: Progressing   Problem: Pain Managment: Goal: General experience of comfort will improve Outcome: Progressing   Problem: Safety: Goal: Ability to remain free from injury will improve Outcome: Progressing   Problem: Skin Integrity: Goal: Risk for impaired skin integrity will decrease Outcome: Progressing   Problem: Education: Goal: Knowledge of disease or condition will improve Outcome: Progressing Goal: Knowledge of secondary prevention will improve Outcome: Progressing Goal: Knowledge of patient specific risk factors addressed and post discharge goals established will improve Outcome: Progressing Goal: Individualized Educational Video(s) Outcome: Progressing

## 2021-03-05 NOTE — Progress Notes (Addendum)
This nurse was in medication room when alerted patient had unwitnessed fall, found by NT on floor. Bed alarm was on. All fall prevention measures were in place and active. Pt very restless and moderately confused. STAT CT scan of head ordered. On call provider and family (sister) notified. Patient not in any distress or pain. Restraints requested and granted by provider to further ensure patient safety. Will continue to monitor and watch patient closely.

## 2021-03-06 ENCOUNTER — Inpatient Hospital Stay (HOSPITAL_COMMUNITY): Payer: Medicaid Other

## 2021-03-06 LAB — GLUCOSE, CAPILLARY
Glucose-Capillary: 111 mg/dL — ABNORMAL HIGH (ref 70–99)
Glucose-Capillary: 128 mg/dL — ABNORMAL HIGH (ref 70–99)
Glucose-Capillary: 131 mg/dL — ABNORMAL HIGH (ref 70–99)
Glucose-Capillary: 135 mg/dL — ABNORMAL HIGH (ref 70–99)
Glucose-Capillary: 145 mg/dL — ABNORMAL HIGH (ref 70–99)
Glucose-Capillary: 147 mg/dL — ABNORMAL HIGH (ref 70–99)
Glucose-Capillary: 91 mg/dL (ref 70–99)

## 2021-03-06 LAB — CBC WITH DIFFERENTIAL/PLATELET
Abs Immature Granulocytes: 0.04 10*3/uL (ref 0.00–0.07)
Basophils Absolute: 0.1 10*3/uL (ref 0.0–0.1)
Basophils Relative: 1 %
Eosinophils Absolute: 0 10*3/uL (ref 0.0–0.5)
Eosinophils Relative: 0 %
HCT: 46.2 % (ref 39.0–52.0)
Hemoglobin: 15.4 g/dL (ref 13.0–17.0)
Immature Granulocytes: 0 %
Lymphocytes Relative: 11 %
Lymphs Abs: 1.1 10*3/uL (ref 0.7–4.0)
MCH: 29.1 pg (ref 26.0–34.0)
MCHC: 33.3 g/dL (ref 30.0–36.0)
MCV: 87.3 fL (ref 80.0–100.0)
Monocytes Absolute: 1.4 10*3/uL — ABNORMAL HIGH (ref 0.1–1.0)
Monocytes Relative: 15 %
Neutro Abs: 6.8 10*3/uL (ref 1.7–7.7)
Neutrophils Relative %: 73 %
Platelets: 400 10*3/uL (ref 150–400)
RBC: 5.29 MIL/uL (ref 4.22–5.81)
RDW: 12 % (ref 11.5–15.5)
WBC: 9.3 10*3/uL (ref 4.0–10.5)
nRBC: 0 % (ref 0.0–0.2)

## 2021-03-06 LAB — BASIC METABOLIC PANEL
Anion gap: 12 (ref 5–15)
BUN: 13 mg/dL (ref 6–20)
CO2: 22 mmol/L (ref 22–32)
Calcium: 9.1 mg/dL (ref 8.9–10.3)
Chloride: 100 mmol/L (ref 98–111)
Creatinine, Ser: 0.95 mg/dL (ref 0.61–1.24)
GFR, Estimated: >60 mL/min (ref >60)
Glucose, Bld: 114 mg/dL — ABNORMAL HIGH (ref 70–99)
Potassium: 4.1 mmol/L (ref 3.5–5.1)
Sodium: 134 mmol/L — ABNORMAL LOW (ref 135–145)

## 2021-03-06 MED ORDER — HYDRALAZINE HCL 50 MG PO TABS
50.0000 mg | ORAL_TABLET | Freq: Three times a day (TID) | ORAL | Status: DC
  Administered 2021-03-06 – 2021-03-11 (×15): 50 mg via ORAL
  Filled 2021-03-06 (×15): qty 1

## 2021-03-06 NOTE — Plan of Care (Signed)
  Problem: Coping: Goal: Will verbalize positive feelings about self Outcome: Progressing Goal: Will identify appropriate support needs Outcome: Progressing   Problem: Health Behavior/Discharge Planning: Goal: Ability to manage health-related needs will improve Outcome: Progressing   Problem: Self-Care: Goal: Ability to participate in self-care as condition permits will improve Outcome: Progressing Goal: Verbalization of feelings and concerns over difficulty with self-care will improve Outcome: Progressing Goal: Ability to communicate needs accurately will improve Outcome: Progressing   Problem: Nutrition: Goal: Risk of aspiration will decrease Outcome: Progressing Goal: Dietary intake will improve Outcome: Progressing   Problem: Intracerebral Hemorrhage Tissue Perfusion: Goal: Complications of Intracerebral Hemorrhage will be minimized Outcome: Progressing   Problem: Safety: Goal: Non-violent Restraint(s) Outcome: Progressing   Problem: Education: Goal: Knowledge of General Education information will improve Description: Including pain rating scale, medication(s)/side effects and non-pharmacologic comfort measures Outcome: Progressing   Problem: Health Behavior/Discharge Planning: Goal: Ability to manage health-related needs will improve Outcome: Progressing   Problem: Clinical Measurements: Goal: Ability to maintain clinical measurements within normal limits will improve Outcome: Progressing Goal: Will remain free from infection Outcome: Progressing Goal: Diagnostic test results will improve Outcome: Progressing Goal: Respiratory complications will improve Outcome: Progressing Goal: Cardiovascular complication will be avoided Outcome: Progressing   Problem: Activity: Goal: Risk for activity intolerance will decrease Outcome: Progressing   Problem: Nutrition: Goal: Adequate nutrition will be maintained Outcome: Progressing   Problem: Coping: Goal: Level  of anxiety will decrease Outcome: Progressing   Problem: Elimination: Goal: Will not experience complications related to bowel motility Outcome: Progressing Goal: Will not experience complications related to urinary retention Outcome: Progressing   Problem: Pain Managment: Goal: General experience of comfort will improve Outcome: Progressing   Problem: Safety: Goal: Ability to remain free from injury will improve Outcome: Progressing   Problem: Skin Integrity: Goal: Risk for impaired skin integrity will decrease Outcome: Progressing   Problem: Education: Goal: Knowledge of disease or condition will improve Outcome: Progressing Goal: Knowledge of secondary prevention will improve Outcome: Progressing Goal: Knowledge of patient specific risk factors addressed and post discharge goals established will improve Outcome: Progressing Goal: Individualized Educational Video(s) Outcome: Progressing

## 2021-03-06 NOTE — Progress Notes (Signed)
PROGRESS NOTE    Chad Coleman  MOQ:[id number] DOB: July 11, 1961 DOA: 02/21/2021 PCP: Patient, No Pcp Per (Inactive)    Chief Complaint  Patient presents with  . Weakness    Brief Narrative:  60 year old gentleman prior history of hypertension, cocaine use, tobacco abuse admitted on 02/21/2021 with a code stroke was found to have intracranial hemorrhage in the right basal ganglia secondary to hypertension and cocaine use.  Patient was transferred from neurology service to hospitalist service on 02/26/2021. Therapy evaluations recommending SNF.  pt had a fall on 03/03/21, he underwent CT head showing increased size of the intraparenchymal hematoma with worsening leftward midline shift, now measuring 8 mm.Effaced right lateral ventricle, which may become entrapped and predisposes to hydrocephalus. He was transferred to ICU overnight, neuro surgery consulted and a repeat CT head ordered which was essentially stable .  Pt had another fall in the room on 03/05/21, repeat CT head without contrast showed Unchanged appearance of right basal ganglia intraparenchymal hematoma with 7 mm of leftward midline shift.  Beside sitter ordered.  Pt seen and examined at bedside.   Assessment & Plan:   Active Problems:   ICH (intracerebral hemorrhage) (HCC)   Essential hypertension   Tobacco abuse   Hyponatremia   Leukocytosis   Controlled type 2 diabetes mellitus with hyperglycemia, without long-term current use of insulin (HCC)  Intracranial hemorrhage  Optimal Blood pressure control, keep sbp <140/90 mmhg.  Echocardiogram showed left ventricle ejection fraction 65 to 70% with grade 1 diastolic dysfunction. LDL is 52. Speech evaluation recommending dysphagia 2 diet with thin liquid. Pt had multiple falls over the last 3 days, repeat CT without contrast showed stable hematoma. Bedside sitter ordered.    Mechanical fall on 03/03/21 and 03/05/21.  A ct head without contrast was obtained, showed  increased  size of the intraparenchymal hematoma with worsening leftward midline shift, now measuring 8 mm.Effaced right lateral ventricle, which may become entrapped and predisposes to hydrocephalus. He was transferred to ICU overnight, neuro surgery consulted and a repeat CT head ordered which was essentially stable .  Pt transferred back to the floor.  Bed side sitter ordered for safety.   Hypokalemia Replaced, repeat level wnl.   Hypertension Not well controlled.  Increase hydralazine 50 mg TID.  in addition to Norvasc 10 mg, clonidine 0.1 mg daily, isosorbide 15 mg daily, lisinopril 10 mg daily   Hyper lipidemia Continue with statin.   Type 2 diabetes mellitus uncontrolled with hyperglycemia Hemoglobin A1 c is 6.4. not on insuliin at home.  Continue with Metformin. CBG (last 3)  Recent Labs    03/06/21 0342 03/06/21 0733 03/06/21 1136  GLUCAP 111* 128* 145*  Continue with SSI.  No changes in meds.     Hyponatremia Much improved to 134.  Probably secondary to poor oral intake. DC thiazide  Mild abdominal pain: abd film is negative for obstruction.   Mild elevation of liver enzymes.  [place] ABD ordered and is negative.   Therapy evaluation recommending SNF,. Currently waiting for a bed at SNF.     DVT prophylaxis: SCDs Code Status: Full code Family Communication: none at bedside.  Disposition:   Status is: Inpatient  Remains inpatient appropriate because:Unsafe d/c plan   Dispo: The patient is from: Home              Anticipated d/c is to: SNF              Patient currently is not medically stable to d/c.  Difficult to place patient Yes       Consultants:   Neurology   Procedures: Echocardiogram  MRI of the brain  Antimicrobials: None  Subjective: NO HEADACHE, no chest pain or sob.    Objective: Vitals:   03/06/21 0734 03/06/21 0756 03/06/21 0954 03/06/21 1138  BP: (!) 182/94 (!) 170/98 (!) 153/86 (!) 159/105  Pulse: 66  71 71  Resp: 18   18   Temp: 98 F (36.7 C)   98.5 F (36.9 C)  TempSrc:    Oral  SpO2: 97%   100%  Weight:      Height:        Intake/Output Summary (Last 24 hours) at 03/06/2021 1330 Last data filed at 03/06/2021 0600 Gross per 24 hour  Intake 150 ml  Output 1400 ml  Net -1250 ml   Filed Weights   02/25/21 1357  Weight: 73.2 kg    Examination:  General exam: Alert and comfortable.  Respiratory system: Air entry fair, no wheezing heard. No tachypnea,  Cardiovascular system:S1S2, RRR, no JVD.  Gastrointestinal system: Abdomen is soft NT ND BS+soft, non tender nondisteded.  Neurological system: alert and oriented to person and place, following simple commands.  Extremities: No pedal edema.  Skin: No rashes seen.  Psychiatry:  Mood is appropriate.     Data Reviewed: I have personally reviewed following labs and imaging studies  CBC: Recent Labs  Lab 02/28/21 0505 03/01/21 0657 03/02/21 0335 03/06/21 1110  WBC 7.1 7.5 9.7 9.3  NEUTROABS  --   --  7.3 6.8  HGB 14.2 14.3 15.0 15.4  HCT 43.2 42.6 45.2 46.2  MCV 88.3 86.8 85.8 87.3  PLT 300 326 387 272    Basic Metabolic Panel: Recent Labs  Lab 02/28/21 0505 03/02/21 0335 03/03/21 0910 03/06/21 1110  NA 134* 130* 134* 134*  K 4.2 3.2* 4.0 4.1  CL 101 96* 99 100  CO2 26 25 23 22   GLUCOSE 118* 139* 120* 114*  BUN 16 13 10 13   CREATININE 1.09 1.02 1.00 0.95  CALCIUM 8.7* 9.1 9.3 9.1  PHOS 2.7  --   --   --     GFR: Estimated Creatinine Clearance: 83.7 mL/min (by C-G formula based on SCr of 0.95 mg/dL).  Liver Function Tests: Recent Labs  Lab 02/28/21 0505  ALBUMIN 2.9*    CBG: Recent Labs  Lab 03/05/21 1940 03/05/21 2334 03/06/21 0342 03/06/21 0733 03/06/21 1136  GLUCAP 108* 123* 111* 128* 145*     No results found for this or any previous visit (from the past 240 hour(s)).       Radiology Studies: CT HEAD WO CONTRAST  Result Date: 03/06/2021 CLINICAL DATA:  Fall EXAM: CT HEAD WITHOUT CONTRAST  TECHNIQUE: Contiguous axial images were obtained from the base of the skull through the vertex without intravenous contrast. COMPARISON:  03/04/2021 FINDINGS: Brain: Unchanged appearance of right basal ganglia intraparenchymal hematoma with 7 mm of leftward midline shift. No new site of hemorrhage. Unchanged size and configuration of the ventricles. Vascular: No hyperdense vessel or unexpected calcification. Skull: Normal. Negative for fracture or focal lesion. Sinuses/Orbits: No acute finding. Other: None. IMPRESSION: Unchanged appearance of right basal ganglia intraparenchymal hematoma with 7 mm of leftward midline shift. Electronically Signed   By: Ulyses Jarred M.D.   On: 03/06/2021 03:35   CT HEAD WO CONTRAST  Result Date: 03/04/2021 CLINICAL DATA:  Follow-up intraparenchymal hemorrhage EXAM: CT HEAD WITHOUT CONTRAST TECHNIQUE: Contiguous axial images were obtained from the  base of the skull through the vertex without intravenous contrast. COMPARISON:  03/03/2021 and multiple previous FINDINGS: Brain: Intraparenchymal hematoma with its epicenter in the right basal ganglia measures 5.9 x 3.8 x 4.4 cm, unchanged when measured using the same technique. Surrounding edema appears the same. Mass-effect with right-to-left midline shift of 8 mm appears the same. Compression of the right lateral ventricle. No evidence of frank ventricular trapping on the left. Elsewhere, chronic small-vessel ischemic changes are seen affecting the cerebral hemispheric white matter Vascular: There is atherosclerotic calcification of the major vessels at the base of the brain. Skull: Negative Sinuses/Orbits: Mucosal thickening and retention cysts in the maxillary sinuses. Orbits negative. Other: None IMPRESSION: No change. Intraparenchymal hematoma with its epicenter in the right basal ganglia measures 5.9 x 3.8 x 4.4 cm (volume = 52 cm^3), unchanged when measured using the same technique. Surrounding edema appears the same.  Mass-effect with right-to-left midline shift of 8 mm appears the same. No evidence of frank ventricular trapping on the left. Electronically Signed   By: Nelson Chimes M.D.   On: 03/04/2021 15:26        Scheduled Meds: . amLODipine  10 mg Oral Daily  . atorvastatin  20 mg Oral Daily  . Chlorhexidine Gluconate Cloth  6 each Topical Daily  . cloNIDine  0.1 mg Oral Daily  . feeding supplement  1 Container Oral TID BM  . hydrALAZINE  50 mg Oral Q8H  . influenza vac split quadrivalent PF  0.5 mL Intramuscular Tomorrow-1000  . insulin aspart  0-9 Units Subcutaneous Q4H  . isosorbide mononitrate  15 mg Oral Daily  . lisinopril  10 mg Oral Daily  . metFORMIN  500 mg Oral Q breakfast  . pantoprazole  40 mg Oral QHS  . polyethylene glycol  17 g Oral Daily  . senna-docusate  1 tablet Oral BID   Continuous Infusions:   LOS: 13 days        Hosie Poisson, MD [facility]   To contact the attending provider between 7A-7P or the covering provider during after hours 7P-7A, please log into the web site [email or web address] and access using universal [place] password for that web site. If you do not have the password, please call the hospital operator.  03/06/2021, 1:30 PM

## 2021-03-07 DIAGNOSIS — S069X9A Unspecified intracranial injury with loss of consciousness of unspecified duration, initial encounter: Secondary | ICD-10-CM

## 2021-03-07 DIAGNOSIS — S069X1A Unspecified intracranial injury with loss of consciousness of 30 minutes or less, initial encounter: Secondary | ICD-10-CM

## 2021-03-07 LAB — GLUCOSE, CAPILLARY
Glucose-Capillary: 133 mg/dL — ABNORMAL HIGH (ref 70–99)
Glucose-Capillary: 180 mg/dL — ABNORMAL HIGH (ref 70–99)
Glucose-Capillary: 144 mg/dL — ABNORMAL HIGH (ref 70–99)
Glucose-Capillary: 118 mg/dL — ABNORMAL HIGH (ref 70–99)
Glucose-Capillary: 110 mg/dL — ABNORMAL HIGH (ref 70–99)

## 2021-03-07 MED ORDER — INSULIN ASPART 100 UNIT/ML ~~LOC~~ SOLN
0.0000 [IU] | Freq: Every day | SUBCUTANEOUS | Status: DC
  Administered 2021-03-12: 2 [IU] via SUBCUTANEOUS

## 2021-03-07 MED ORDER — INSULIN ASPART 100 UNIT/ML ~~LOC~~ SOLN
0.0000 [IU] | Freq: Three times a day (TID) | SUBCUTANEOUS | Status: DC
  Administered 2021-03-07: 1 [IU] via SUBCUTANEOUS
  Administered 2021-03-07: 2 [IU] via SUBCUTANEOUS
  Administered 2021-03-08 – 2021-03-11 (×6): 1 [IU] via SUBCUTANEOUS
  Administered 2021-03-11: 2 [IU] via SUBCUTANEOUS
  Administered 2021-03-11 – 2021-03-12 (×2): 1 [IU] via SUBCUTANEOUS
  Administered 2021-03-14: 2 [IU] via SUBCUTANEOUS
  Administered 2021-03-14 – 2021-03-15 (×2): 1 [IU] via SUBCUTANEOUS
  Administered 2021-03-15 – 2021-03-17 (×3): 2 [IU] via SUBCUTANEOUS
  Administered 2021-03-18: 3 [IU] via SUBCUTANEOUS
  Administered 2021-03-22 – 2021-03-27 (×3): 1 [IU] via SUBCUTANEOUS
  Administered 2021-03-28: 2 [IU] via SUBCUTANEOUS
  Administered 2021-03-30 – 2021-04-03 (×2): 1 [IU] via SUBCUTANEOUS
  Administered 2021-04-07: 2 [IU] via SUBCUTANEOUS
  Administered 2021-04-08 – 2021-04-11 (×5): 1 [IU] via SUBCUTANEOUS

## 2021-03-07 MED ORDER — VALPROIC ACID 250 MG PO CAPS
250.0000 mg | ORAL_CAPSULE | Freq: Two times a day (BID) | ORAL | Status: DC
  Administered 2021-03-07 – 2021-04-13 (×75): 250 mg via ORAL
  Filled 2021-03-07 (×77): qty 1

## 2021-03-07 MED ORDER — INSULIN ASPART 100 UNIT/ML ~~LOC~~ SOLN: 0.0000 [IU] | Freq: Three times a day (TID) | SUBCUTANEOUS | Status: DC

## 2021-03-07 MED ORDER — SERTRALINE HCL 50 MG PO TABS
25.0000 mg | ORAL_TABLET | Freq: Every day | ORAL | Status: DC
  Administered 2021-03-07 – 2021-03-17 (×11): 25 mg via ORAL
  Filled 2021-03-07 (×12): qty 1

## 2021-03-07 NOTE — Plan of Care (Signed)
  Problem: Coping: Goal: Will identify appropriate support needs Outcome: Progressing   Problem: Self-Care: Goal: Ability to participate in self-care as condition permits will improve Outcome: Progressing Goal: Verbalization of feelings and concerns over difficulty with self-care will improve Outcome: Progressing Goal: Ability to communicate needs accurately will improve Outcome: Progressing   Problem: Nutrition: Goal: Risk of aspiration will decrease Outcome: Progressing Goal: Dietary intake will improve Outcome: Progressing   Problem: Intracerebral Hemorrhage Tissue Perfusion: Goal: Complications of Intracerebral Hemorrhage will be minimized Outcome: Progressing   Problem: Education: Goal: Knowledge of General Education information will improve Description: Including pain rating scale, medication(s)/side effects and non-pharmacologic comfort measures Outcome: Progressing   Problem: Health Behavior/Discharge Planning: Goal: Ability to manage health-related needs will improve Outcome: Progressing   Problem: Clinical Measurements: Goal: Ability to maintain clinical measurements within normal limits will improve Outcome: Progressing Goal: Will remain free from infection Outcome: Progressing Goal: Diagnostic test results will improve Outcome: Progressing Goal: Respiratory complications will improve Outcome: Progressing Goal: Cardiovascular complication will be avoided Outcome: Progressing   Problem: Activity: Goal: Risk for activity intolerance will decrease Outcome: Progressing   Problem: Nutrition: Goal: Adequate nutrition will be maintained Outcome: Progressing   Problem: Coping: Goal: Level of anxiety will decrease Outcome: Progressing   Problem: Elimination: Goal: Will not experience complications related to bowel motility Outcome: Progressing Goal: Will not experience complications related to urinary retention Outcome: Progressing   Problem: Pain  Managment: Goal: General experience of comfort will improve Outcome: Progressing   Problem: Skin Integrity: Goal: Risk for impaired skin integrity will decrease Outcome: Progressing   Problem: Education: Goal: Knowledge of disease or condition will improve Outcome: Progressing Goal: Knowledge of secondary prevention will improve Outcome: Progressing Goal: Knowledge of patient specific risk factors addressed and post discharge goals established will improve Outcome: Progressing Goal: Individualized Educational Video(s) Outcome: Progressing

## 2021-03-07 NOTE — Progress Notes (Signed)
Physical Therapy Treatment Patient Details Name: Chad Coleman MRN: [id number] DOB: Jul 04, 1961 Today's Date: 03/07/2021    History of Present Illness 60 yo male presents to [facility] on 3/28 with acute onset of L weakness, numbness, and L facial droop. CTH R BG ICH, suspect hypertensive.  Fall on 02/28/21. imaging showing increased in R intraparenchymal hematoma with worsening leftward midline shift on 4/7. PMH includes hypertension, tobacco use, noncompliance with antihypertensive medications, and cocaine abuse.    PT Comments    Pt sleepy this session and reports he could not sleep last night, c/o 6/10 R shoulder pain, RN gave meds in session. Pt came to EOB with min A to LLE with increased time given and vc's for using RUE and RLE to assist LLE. Worked on maintaining midline in sitting. Pt able to achieve this with min-guard A when leaning R elbow against elevated HOB. Sit>stand performed using chair back for support and mod A to L side for knee control and L sided trunk extension. Pt more engaged during session today despite sleepiness. Able to verbalize when he was fatigued and needed to return to supine. Bed alarm on and posey reapplied. PT will continue to follow.    Follow Up Recommendations  Supervision/Assistance - 24 hour;SNF     Equipment Recommendations  Other (comment) (defer to post acute)    Recommendations for Other Services       Precautions / Restrictions Precautions Precautions: Fall Precaution Comments: Pusher, L neglect, impulsive Restrictions Weight Bearing Restrictions: No    Mobility  Bed Mobility Overal bed mobility: Needs Assistance Bed Mobility: Supine to Sit;Sit to Supine     Supine to sit: HOB elevated;Min assist Sit to supine: Mod assist;+2 for physical assistance   General bed mobility comments: pt able to sit self straight up from elevated HOB with use of RUE, needed cues to attend to LLE, hooked RLE under L and was able to bring it to EOB with LE  and RUE as well as min A. Mod A for return to supine for LLE    Transfers Overall transfer level: Needs assistance Equipment used: None Transfers: Sit to/from Stand Sit to Stand: Mod assist;+2 physical assistance         General transfer comment: worked on sit>stand to small blue chair in room with pt holding back of it with R hand and therapist on L side. Tech stabilized chair. Performed multiple times for practice effect. Pt needed L knee support as well as stability at L shoulder. Pt with L lean initially but able to self correct when cued by therapist.  Ambulation/Gait             General Gait Details: deferred to practice standing balance   Stairs             Wheelchair Mobility    Modified Rankin (Stroke Patients Only) Modified Rankin (Stroke Patients Only) Pre-Morbid Rankin Score: No symptoms Modified Rankin: Severe disability     Balance Overall balance assessment: Needs assistance Sitting-balance support: Single extremity supported;Feet supported Sitting balance-Leahy Scale: Poor Sitting balance - Comments: in sitting, HOB elevated and pt cued to lean R elbow against high HOB. Pt able to maintain midline this way for 1-3 mins at a time with min-guard A. Worked dynamic balance for short bouts and then returned to this position for rest posture. Postural control: Left lateral lean Standing balance support: Bilateral upper extremity supported Standing balance-Leahy Scale: Poor Standing balance comment: Reliant on UE support and physical A for  postural correction                            Cognition Arousal/Alertness: Awake/alert Behavior During Therapy: Flat affect Overall Cognitive Status: Impaired/Different from baseline Area of Impairment: Attention;Memory;Following commands;Safety/judgement;Awareness;Problem solving                   Current Attention Level: Sustained (short duration) Memory: Decreased short-term memory;Decreased  recall of precautions Following Commands: Follows one step commands consistently Safety/Judgement: Decreased awareness of safety;Decreased awareness of deficits Awareness: Intellectual Problem Solving: Slow processing;Decreased initiation;Requires verbal cues;Requires tactile cues;Difficulty sequencing General Comments: pt more engaged in session today than 1 week ago, also able to relay that he was fatigued end of session.      Exercises Other Exercises Other Exercises: [place] through LLE with manual resistance Other Exercises: worked on achieving midline posture with a visual target, able to achieve 3/5 trials    General Comments General comments (skin integrity, edema, etc.): VSS      Pertinent Vitals/Pain Pain Assessment: Faces Faces Pain Scale: Hurts even more Pain Location: R shoulder Pain Descriptors / Indicators: Grimacing;Guarding Pain Intervention(s): Limited activity within patient's tolerance;Monitored during session;RN gave pain meds during session    Home Living                      Prior Function            PT Goals (current goals can now be found in the care plan section) Acute Rehab PT Goals Patient Stated Goal: go home and he states that he is ready to do that now PT Goal Formulation: With patient Time For Goal Achievement: 03/08/21 Potential to Achieve Goals: Good Progress towards PT goals: Progressing toward goals    Frequency    Min 3X/week      PT Plan Current plan remains appropriate    Co-evaluation              AM-PAC PT "6 Clicks" Mobility   Outcome Measure  Help needed turning from your back to your side while in a flat bed without using bedrails?: A Lot Help needed moving from lying on your back to sitting on the side of a flat bed without using bedrails?: A Lot Help needed moving to and from a bed to a chair (including a wheelchair)?: Total Help needed standing up from a chair using your arms (e.g., wheelchair or  bedside chair)?: A Lot Help needed to walk in hospital room?: Total Help needed climbing 3-5 steps with a railing? : Total 6 Click Score: 9    End of Session Equipment Utilized During Treatment: Gait belt Activity Tolerance: Patient limited by fatigue Patient left: in bed;with call bell/phone within reach;with bed alarm set;with restraints reapplied Nurse Communication: Mobility status PT Visit Diagnosis: Hemiplegia and hemiparesis;Unsteadiness on feet (R26.81);Muscle weakness (generalized) (M62.81);Difficulty in walking, not elsewhere classified (R26.2);Other symptoms and signs involving the nervous system (R29.898) Hemiplegia - Right/Left: Left Hemiplegia - dominant/non-dominant: Dominant Hemiplegia - caused by: Nontraumatic intracerebral hemorrhage     Time: 1230-1256 PT Time Calculation (min) (ACUTE ONLY): 26 min  Charges:  $Therapeutic Activity: 8-22 mins $Neuromuscular Re-education: 8-22 mins                     Leighton Roach, [place]  Pager [phone] Office [place] 03/07/2021, 2:02 PM

## 2021-03-07 NOTE — Progress Notes (Signed)
CSW spoke with Elie Confer at [facility] states there is not a pending Medicaid application for this patient on file. CSW requested patient be screened with the assistance of his sister to complete an application.  Madilyn Fireman, MSW, LCSW Transitions of Care  Clinical Social Worker II [phone]

## 2021-03-07 NOTE — Progress Notes (Signed)
[facility] PROGRESS NOTE  Chad Coleman QHU:[id number] DOB: [birth year] DOA: 02/21/2021 PCP: Patient, No Pcp Per (Inactive)  Status: Remains inpatient appropriate because:Altered mental status, Unsafe d/c plan and Inpatient level of care appropriate due to severity of illness   Dispo: The patient is from: Home              Anticipated d/c is to: SNF              Patient currently is not medically stable to d/c.               Barriers to DC: No insurance.  Medicaid has not been filed apparently because there is no POA in place to complete paperwork.  Plan is to contact financial counseling to determine if patient sister could qualify to assist in completing paperwork.   Difficult to place patient Yes   Level of care: Med-Surg  Code Status: Full Family Communication:  DVT prophylaxis: SCDs Vaccination status: Unknown  Foley catheter: Condom catheter  HPI: 60 year old gentleman prior history of hypertension, cocaine use, tobacco abuse admitted on 02/21/2021 with a code stroke was found to have intracranial hemorrhage in the right basal ganglia secondary to hypertension and cocaine use.  Patient was transferred from neurology service to hospitalist service on 02/26/2021. Therapy evaluations recommending SNF.  pt had a fall on 03/03/21, he underwent CT head showing increased size of the intraparenchymal hematoma with worsening leftward midline shift, now measuring 8 mm.Effaced right lateral ventricle, which may become entrapped and predisposes to hydrocephalus. He was transferred to ICU overnight, neuro surgery consulted and a repeat CT head ordered which was essentially stable .  Pt had another fall in the room on 03/05/21, repeat CT head without contrast showed Unchanged appearance of right basal ganglia intraparenchymal hematoma with 7 mm of leftward midline shift.  Subjective: Patient is awake and alert.  Oriented.  Denies pain.  Stating he wants to discharge home.  Reporting "I  want to get out of here".  Objective: Vitals:   03/07/21 0445 03/07/21 0750  BP: (!) 151/89 (!) 155/87  Pulse:  68  Resp: 16   Temp:  98.6 F (37 C)  SpO2:  99%    Intake/Output Summary (Last 24 hours) at 03/07/2021 0753 Last data filed at 03/07/2021 4656 Gross per 24 hour  Intake 1680 ml  Output 650 ml  Net 1030 ml   Filed Weights   02/25/21 1357  Weight: 73.2 kg    Exam:  Constitutional: NAD, calm, comfortable Respiratory: clear to auscultation bilaterally, no wheezing, no crackles. Normal respiratory effort. No accessory muscle use.  Room air Cardiovascular: Regular rate and rhythm, no murmurs / rubs / gallops. No extremity edema. 2+ pedal pulses.   Abdomen: no tenderness, no masses palpated. No hepatosplenomegaly. Bowel sounds positive.  LBM Musculoskeletal: no clubbing / cyanosis. No joint deformity upper and lower extremities. Good ROM, no contractures. Normal muscle tone.  Skin: no rashes, lesions, ulcers. No induration Neurologic: CN 2-12 grossly intact. Sensation intact, DTR normal. Strength 5/5 on the right with a dense, insensate left hemiplegia involving both the upper and lower extremity. Psychiatric:  Alert and oriented x 3 and that he is able to accurately answer orientation questions.  Lacks capacity and insight into current medical condition and limitations.  Did poorly on recent SLUMS screening by speech therapy with a score of 15/30   Assessment/Plan: Acute problems: Intracranial hemorrhage with associated brain injury Optimal Blood pressure control, keep sbp <140/90 mmhg.  Echocardiogram  showed left ventricle ejection fraction 65 to 70% with grade 1 diastolic dysfunction. LDL is 52. Speech evaluation recommending dysphagia 2 diet with thin liquid. Pt had multiple falls over the last 3 days, repeat CT without contrast showed stable hematoma. Bedside sitter ordered. Continues to have issues with impulsivity secondary to brain injury.  Have initiated  low-dose Zoloft and Depakote. Cognitive eval per OT/SLP has demonstrated deficits (see below) SLP COGNITIVE EVAL 4/8: Overall, his speech-language-cognitive function appears similar to assessment on 3/29 with exception of working memory. Severity of dysarthria was unchanged and is mild-mod in conversation. Expressive and receptive language is functional. On SLUMS assessment he scored a 15 out of possible 30 requiring cues to retrieve 3/5 on word recall, difficulty with mental calculations, accurate with 1 of 4 questions for auditory recall of paragraph. ST will continue treatment to increase independence. OT COGNITIVE EVAL 4/8:  Arousal/Alertness: Awake/alert Behavior During Therapy: Flat affect Overall Cognitive Status: Impaired/Different from baseline Area of Impairment: Attention;Memory;Following commands;Safety/judgement;Awareness;Problem solving Current Attention Level: Sustained (short time) Memory: Decreased short-term memory;Decreased recall of precautions Following Commands: Follows one step commands consistently Safety/Judgement: Decreased awareness of safety;Decreased awareness of deficits Awareness: Intellectual Problem Solving: Slow processing;Decreased initiation;Requires verbal cues;Requires tactile cues;Difficulty sequencing General Comments: Pt impulsive and requiring simple, direct cues. Follow one step commands with calm environement. At end of session, providsing questioning cues to have pt review session, pt abel to report what he did and why with Mod questioning cues    Mechanical fall on 03/03/21 and 03/05/21. A ct head without contrast was obtained, showed  increased size of the intraparenchymal hematoma with worsening leftward midline shift, now measuring 8 mm.Effaced right lateral ventricle, which may become entrapped and predisposes to hydrocephalus. He was transferred to ICU overnight, neuro surgery consulted and a repeat CT head ordered which was essentially stable .   Pt transferred back to the floor.  Bed side sitter ordered for safety.   Physical Deconditioning PT eval 4/6:  Pt in bed upon arrival to room, reports and presents with drowsiness but agreeable to OOB mobility. Pt continues to present with heavy pushing towards L once sitting EOB, pt noticing he felt "off balance" when doing this and corrected self with visual cues via mirror and postural cues from PT. Pt tolerated repeated stands at EOB, requiring max +2 assist and complete support of LUE and LLE. Pt demonstrating impulsivity and poor safety awareness throughout session, when moving to chair pt sat down prematurely on the very edge of the recliner requiring PT to lift him up and boost him back into the chair. Pt left in recliner with posey belt chair alarm activated and fall pads placed, PT to continue to follow acutely.   OT eval 4/8:   Pt continues to present decreased balance, cognition, and functional use of LUE. Upon arrival, pt performing self feeding tasks in bed. Optimizing upright posture and providing Min A for managing bowl as pt use RUE to manage spoon and eat breakfast. Cues for slowing down and attending to task as pt impulsively moving and dropping food on himself. Pt performing sitting balance tasks at EOB with visual aide for midline; fluctuating between needing Min A to Max A for postural corrections. Use of stedy for practicing standing balance (use of mirror at sink as visual aid). Pt benefiting from direct, simple cues for sequencing and problem solving balance deficits. Continue to recommend dc to CIR for intensive OT and will continue to follow acutely as admitted.  Hypertension Not well controlled.  Increase hydralazine 50 mg TID.  in addition to Norvasc 10 mg, clonidine 0.1 mg daily, isosorbide 15 mg daily, lisinopril 10 mg daily  Hyperlipidemia Continue with statin.   Type 2 diabetes mellitus uncontrolled with hyperglycemia Hemoglobin A1 c is 6.4. not on  insuliin at home.  Current CBGs well-controlled over the past 24 hours with range of 91-180 Continue with Metformin. Continue with SSI.   Hyponatremia Much improved to 134.  Probably secondary to poor oral intake. Thiazide diuretic has been discontinued. Serum osmolality was slightly low at 274 with low normal 275. 4/11 no indication to continue fluid restriction therefore this was discontinued.     Other problems: Hypokalemia Replaced, repeat level wnl.   Mild abdominal pain: abd film is negative for obstruction.   Mild elevation of liver enzymes.  [place] ABD ordered and is negative.    Data Reviewed: Basic Metabolic Panel: Recent Labs  Lab 03/02/21 0335 03/03/21 0910 03/06/21 1110  NA 130* 134* 134*  K 3.2* 4.0 4.1  CL 96* 99 100  CO2 25 23 22   GLUCOSE 139* 120* 114*  BUN 13 10 13   CREATININE 1.02 1.00 0.95  CALCIUM 9.1 9.3 9.1   Liver Function Tests: No results for input(s): AST, ALT, ALKPHOS, BILITOT, PROT, ALBUMIN in the last 168 hours. No results for input(s): LIPASE, AMYLASE in the last 168 hours. No results for input(s): AMMONIA in the last 168 hours. CBC: Recent Labs  Lab 03/01/21 0657 03/02/21 0335 03/06/21 1110  WBC 7.5 9.7 9.3  NEUTROABS  --  7.3 6.8  HGB 14.3 15.0 15.4  HCT 42.6 45.2 46.2  MCV 86.8 85.8 87.3  PLT 326 387 400   Cardiac Enzymes: No results for input(s): CKTOTAL, CKMB, CKMBINDEX, TROPONINI in the last 168 hours. BNP (last 3 results) No results for input(s): BNP in the last 8760 hours.  ProBNP (last 3 results) No results for input(s): PROBNP in the last 8760 hours.  CBG: Recent Labs  Lab 03/06/21 1517 03/06/21 2021 03/06/21 2356 03/07/21 0357 03/07/21 0749  GLUCAP 91 135* 147* 133* 180*    No results found for this or any previous visit (from the past 240 hour(s)).   Studies: CT HEAD WO CONTRAST  Result Date: 03/06/2021 CLINICAL DATA:  Fall EXAM: CT HEAD WITHOUT CONTRAST TECHNIQUE: Contiguous axial images were  obtained from the base of the skull through the vertex without intravenous contrast. COMPARISON:  03/04/2021 FINDINGS: Brain: Unchanged appearance of right basal ganglia intraparenchymal hematoma with 7 mm of leftward midline shift. No new site of hemorrhage. Unchanged size and configuration of the ventricles. Vascular: No hyperdense vessel or unexpected calcification. Skull: Normal. Negative for fracture or focal lesion. Sinuses/Orbits: No acute finding. Other: None. IMPRESSION: Unchanged appearance of right basal ganglia intraparenchymal hematoma with 7 mm of leftward midline shift. Electronically Signed   By: Ulyses Jarred M.D.   On: 03/06/2021 03:35    Scheduled Meds: . amLODipine  10 mg Oral Daily  . atorvastatin  20 mg Oral Daily  . Chlorhexidine Gluconate Cloth  6 each Topical Daily  . cloNIDine  0.1 mg Oral Daily  . feeding supplement  1 Container Oral TID BM  . hydrALAZINE  50 mg Oral Q8H  . influenza vac split quadrivalent PF  0.5 mL Intramuscular Tomorrow-1000  . insulin aspart  0-9 Units Subcutaneous Q4H  . isosorbide mononitrate  15 mg Oral Daily  . lisinopril  10 mg Oral Daily  . metFORMIN  500 mg  Oral Q breakfast  . pantoprazole  40 mg Oral QHS  . polyethylene glycol  17 g Oral Daily  . senna-docusate  1 tablet Oral BID   Continuous Infusions:  Active Problems:   ICH (intracerebral hemorrhage) ([place])   Essential hypertension   Tobacco abuse   Hyponatremia   Leukocytosis   Controlled type 2 diabetes mellitus with hyperglycemia, without long-term current use of insulin [facility])   Consultants:  Neurology  Neurosurgery  Procedures:  Echocardiogram  Antibiotics: Anti-infectives (From admission, onward)   None        Time spent: 35 minutes    Erin Hearing ANP  [facility] 7 am - 330 pm/M-F for direct patient care and secure chat Please refer to Amion for contact info 14  days

## 2021-03-08 LAB — GLUCOSE, CAPILLARY
Glucose-Capillary: 119 mg/dL — ABNORMAL HIGH (ref 70–99)
Glucose-Capillary: 124 mg/dL — ABNORMAL HIGH (ref 70–99)
Glucose-Capillary: 132 mg/dL — ABNORMAL HIGH (ref 70–99)
Glucose-Capillary: 101 mg/dL — ABNORMAL HIGH (ref 70–99)

## 2021-03-08 MED ORDER — DICLOFENAC SODIUM 1 % EX GEL
4.0000 g | Freq: Four times a day (QID) | CUTANEOUS | Status: DC
  Administered 2021-03-08 – 2021-04-12 (×119): 4 g via TOPICAL
  Filled 2021-03-08 (×2): qty 100

## 2021-03-08 NOTE — Progress Notes (Signed)
CSW assisted Erin Hearing, NP in completion of provider letter to support the patient's Medicaid application. Letter was sent to [place].  Madilyn Fireman, MSW, LCSW Transitions of Care  Clinical Social Worker II [phone]

## 2021-03-08 NOTE — Progress Notes (Signed)
[facility] PROGRESS NOTE  Chad Coleman VQQ:[id number] DOB: Dec 10, 1960 DOA: 02/21/2021 PCP: Patient, No Pcp Per (Inactive)  Status: Remains inpatient appropriate because:Altered mental status, Unsafe d/c plan and Inpatient level of care appropriate due to severity of illness   Dispo: The patient is from: Home              Anticipated d/c is to: SNF              Patient currently is not medically stable to d/c.               Barriers to DC: No insurance.  Medicaid has not been filed apparently because there is no POA in place to complete paperwork.  Plan is to contact financial counseling to determine if patient sister could qualify to assist in completing paperwork.   Difficult to place patient Yes   Level of care: Med-Surg  Code Status: Full Family Communication:  DVT prophylaxis: SCDs Vaccination status: Unknown  Foley catheter: Condom catheter  HPI: 60 year old gentleman prior history of hypertension, cocaine use, tobacco abuse admitted on 02/21/2021 with a code stroke was found to have intracranial hemorrhage in the right basal ganglia secondary to hypertension and cocaine use.  Patient was transferred from neurology service to hospitalist service on 02/26/2021. Therapy evaluations recommending SNF.  pt had a fall on 03/03/21, he underwent CT head showing increased size of the intraparenchymal hematoma with worsening leftward midline shift, now measuring 8 mm.Effaced right lateral ventricle, which may become entrapped and predisposes to hydrocephalus. He was transferred to ICU overnight, neuro surgery consulted and a repeat CT head ordered which was essentially stable .  Pt had another fall in the room on 03/05/21, repeat CT head without contrast showed Unchanged appearance of right basal ganglia intraparenchymal hematoma with 7 mm of leftward midline shift.  Subjective: Awake.  Complaining of some mild musculoskeletal pain in the right anterior shoulder noting this is the  same arm patient uses to reposition self in bed in context of dense left hemiplegia.  Objective: Vitals:   03/08/21 0027 03/08/21 0351  BP: (!) 165/83 (!) 152/87  Pulse: 66 65  Resp: 18 18  Temp: 98.1 F (36.7 C) 97.9 F (36.6 C)  SpO2: 97% 98%    Intake/Output Summary (Last 24 hours) at 03/08/2021 0755 Last data filed at 03/08/2021 4332 Gross per 24 hour  Intake 360 ml  Output 701 ml  Net -341 ml   Filed Weights   02/25/21 1357  Weight: 73.2 kg    Exam:  Constitutional: Alert, calm, mild distress as evidenced by shoulder discomfort Respiratory: Lungs are clear.  Stable on room air Cardiovascular: Heart sounds are normal, no peripheral edema although does have some mild swelling in hand secondary to dependency from neurological injury, regular pulse Abdomen:   LBM 4/11, soft nontender with normoactive bowel sounds.  Variable oral intake  Neurologic: CN 2-12 grossly intact. Sensation intact, DTR normal. Strength 5/5 on the right with a dense, insensate left hemiplegia involving both the upper and lower extremity. Psychiatric: Alert and oriented x3 although lacks capacity to understand severity of current illness.  He was able to verbalize understanding that he did not live alone asked today.  Did poorly on recent SLUMS screening by speech therapy with a score of 15/30   Assessment/Plan: Acute problems: Intracranial hemorrhage with associated brain injury Optimal Blood pressure control, keep sbp <140/90 mmhg.  Echocardiogram showed left ventricle ejection fraction 65 to 70% with grade 1 diastolic dysfunction.  LDL is 52. Speech evaluation recommending dysphagia 2 diet with thin liquid. Pt had multiple falls over the last 3 days, repeat CT without contrast showed stable hematoma. Bedside sitter ordered. Continues to have issues with impulsivity secondary to brain injury.  Have initiated low-dose Zoloft and Depakote. Cognitive eval per OT/SLP has demonstrated deficits (see  below) SLP COGNITIVE EVAL 4/8: Overall, his speech-language-cognitive function appears similar to assessment on 3/29 with exception of working memory. Severity of dysarthria was unchanged and is mild-mod in conversation. Expressive and receptive language is functional. On SLUMS assessment he scored a 15 out of possible 30 requiring cues to retrieve 3/5 on word recall, difficulty with mental calculations, accurate with 1 of 4 questions for auditory recall of paragraph. ST will continue treatment to increase independence. OT COGNITIVE EVAL 4/8:  Arousal/Alertness: Awake/alert Behavior During Therapy: Flat affect Overall Cognitive Status: Impaired/Different from baseline Area of Impairment: Attention;Memory;Following commands;Safety/judgement;Awareness;Problem solving Current Attention Level: Sustained (short time) Memory: Decreased short-term memory;Decreased recall of precautions Following Commands: Follows one step commands consistently Safety/Judgement: Decreased awareness of safety;Decreased awareness of deficits Awareness: Intellectual Problem Solving: Slow processing;Decreased initiation;Requires verbal cues;Requires tactile cues;Difficulty sequencing General Comments: Pt impulsive and requiring simple, direct cues. Follow one step commands with calm environement. At end of session, providsing questioning cues to have pt review session, pt abel to report what he did and why with Mod questioning cues    Mechanical fall on 03/03/21 and 03/05/21. A ct head without contrast was obtained, showed  increased size of the intraparenchymal hematoma with worsening leftward midline shift, now measuring 8 mm.Effaced right lateral ventricle, which may become entrapped and predisposes to hydrocephalus. He was transferred to ICU overnight, neuro surgery consulted and a repeat CT head ordered which was essentially stable .  Pt transferred back to the floor.  Bed side sitter ordered for safety.   Right  anterior shoulder pain -No crepitus on exam -Suspect mild muscle strain as this is extremity patient uses to reposition self in bed post stroke -Apply heat -Begin Voltaren gel  Physical Deconditioning PT eval 4/11:  Pt sleepy this session and reports he could not sleep last night, c/o 6/10 R shoulder pain, RN gave meds in session. Pt came to EOB with min A to LLE with increased time given and vc's for using RUE and RLE to assist LLE. Worked on maintaining midline in sitting. Pt able to achieve this with min-guard A when leaning R elbow against elevated HOB. Sit>stand performed using chair back for support and mod A to L side for knee control and L sided trunk extension. Pt more engaged during session today despite sleepiness. Able to verbalize when he was fatigued and needed to return to supine. Bed alarm on and posey reapplied. PT will continue to follow.   OT eval 4/8:   Pt continues to present decreased balance, cognition, and functional use of LUE. Upon arrival, pt performing self feeding tasks in bed. Optimizing upright posture and providing Min A for managing bowl as pt use RUE to manage spoon and eat breakfast. Cues for slowing down and attending to task as pt impulsively moving and dropping food on himself. Pt performing sitting balance tasks at EOB with visual aide for midline; fluctuating between needing Min A to Max A for postural corrections. Use of stedy for practicing standing balance (use of mirror at sink as visual aid). Pt benefiting from direct, simple cues for sequencing and problem solving balance deficits. Continue to recommend dc to CIR for intensive  OT and will continue to follow acutely as admitted.      Hypertension Not well controlled.  Increase hydralazine 50 mg TID.  in addition to Norvasc 10 mg, clonidine 0.1 mg daily, isosorbide 15 mg daily, lisinopril 10 mg daily  Hyperlipidemia Continue with statin.   Type 2 diabetes mellitus uncontrolled with  hyperglycemia Hemoglobin A1 c is 6.4. not on insuliin at home.  Current CBGs well-controlled over the past 24 hours with range of 91-180 Continue with Metformin. Continue with SSI.   Hyponatremia Much improved to 134.  Probably secondary to poor oral intake. Thiazide diuretic has been discontinued. Serum osmolality was slightly low at 274 with low normal 275. 4/11 no indication to continue fluid restriction therefore this was discontinued.     Other problems: Hypokalemia Replaced, repeat level wnl.   Mild abdominal pain: abd film is negative for obstruction.   Mild elevation of liver enzymes.  [place] ABD ordered and is negative.    Data Reviewed: Basic Metabolic Panel: Recent Labs  Lab 03/02/21 0335 03/03/21 0910 03/06/21 1110  NA 130* 134* 134*  K 3.2* 4.0 4.1  CL 96* 99 100  CO2 25 23 22   GLUCOSE 139* 120* 114*  BUN 13 10 13   CREATININE 1.02 1.00 0.95  CALCIUM 9.1 9.3 9.1   Liver Function Tests: No results for input(s): AST, ALT, ALKPHOS, BILITOT, PROT, ALBUMIN in the last 168 hours. No results for input(s): LIPASE, AMYLASE in the last 168 hours. No results for input(s): AMMONIA in the last 168 hours. CBC: Recent Labs  Lab 03/02/21 0335 03/06/21 1110  WBC 9.7 9.3  NEUTROABS 7.3 6.8  HGB 15.0 15.4  HCT 45.2 46.2  MCV 85.8 87.3  PLT 387 400   Cardiac Enzymes: No results for input(s): CKTOTAL, CKMB, CKMBINDEX, TROPONINI in the last 168 hours. BNP (last 3 results) No results for input(s): BNP in the last 8760 hours.  ProBNP (last 3 results) No results for input(s): PROBNP in the last 8760 hours.  CBG: Recent Labs  Lab 03/07/21 0749 03/07/21 1145 03/07/21 1614 03/07/21 2115 03/08/21 0606  GLUCAP 180* 144* 118* 110* 119*    No results found for this or any previous visit (from the past 240 hour(s)).   Studies: No results found.  Scheduled Meds: . amLODipine  10 mg Oral Daily  . atorvastatin  20 mg Oral Daily  . cloNIDine  0.1 mg Oral  Daily  . feeding supplement  1 Container Oral TID BM  . hydrALAZINE  50 mg Oral Q8H  . influenza vac split quadrivalent PF  0.5 mL Intramuscular Tomorrow-1000  . insulin aspart  0-5 Units Subcutaneous QHS  . insulin aspart  0-9 Units Subcutaneous TID WC  . isosorbide mononitrate  15 mg Oral Daily  . lisinopril  10 mg Oral Daily  . metFORMIN  500 mg Oral Q breakfast  . pantoprazole  40 mg Oral QHS  . polyethylene glycol  17 g Oral Daily  . senna-docusate  1 tablet Oral BID  . sertraline  25 mg Oral Daily  . valproic acid  250 mg Oral BID   Continuous Infusions:  Active Problems:   Intraparenchymal hemorrhage of brain ([facility])   Hypertension   Tobacco abuse   Hyponatremia   Leukocytosis   Controlled type 2 diabetes mellitus with hyperglycemia, without long-term current use of insulin ([place])   Brain injury with brief loss of consciousness [facility])   Consultants:  Neurology  Neurosurgery  Procedures:  Echocardiogram  Antibiotics: Anti-infectives (From  admission, onward)   None       Time spent: 35 minutes    Erin Hearing ANP  [facility] 7 am - 330 pm/M-F for direct patient care and secure chat Please refer to Amion for contact info 15  days

## 2021-03-08 NOTE — Progress Notes (Addendum)
Physical Therapy Treatment Patient Details Name: Chad Coleman MRN: [id number] DOB: 12-28-1960 Today's Date: 03/08/2021    History of Present Illness 60 yo male presents to [facility] on 3/28 with acute onset of L weakness, numbness, and L facial droop. CTH R BG ICH, suspect hypertensive.  Fall on 02/28/21. imaging showing increased in R intraparenchymal hematoma with worsening leftward midline shift on 4/7. PMH includes hypertension, tobacco use, noncompliance with antihypertensive medications, and cocaine abuse.    PT Comments    Pt supine in bed on arrival this session.  Pt reports fatigue after working with OTA but agreeable to PT session this pm.  Focused on bed level exercises.  Provided resistance to LE exercises with knee extension and hip adduction.  Continue to recommend snf.      Follow Up Recommendations  Supervision/Assistance - 24 hour;SNF     Equipment Recommendations  Other (comment) (defer to post acute)    Recommendations for Other Services       Precautions / Restrictions Precautions Precautions: Fall Precaution Comments: Pusher, L neglect, impulsive Restrictions Weight Bearing Restrictions: No    Mobility  Bed Mobility    General bed mobility comments: +2 mod to boost to [facility], refused OOB after working with OT this session but agreeable.    Transfers  Ambulation/Gait                 Stairs             Wheelchair Mobility    Modified Rankin (Stroke Patients Only)       Balance                            Cognition Arousal/Alertness: Awake/alert Behavior During Therapy: Flat affect Overall Cognitive Status: Impaired/Different from baseline Area of Impairment: Attention;Safety/judgement;Awareness;Problem solving                   Current Attention Level: Sustained   Following Commands: Follows one step commands consistently Safety/Judgement: Decreased awareness of safety;Decreased awareness of  deficits Awareness: Intellectual Problem Solving: Slow processing;Decreased initiation;Requires verbal cues;Requires tactile cues;Difficulty sequencing General Comments: Intermittently lethargic reports," I'm tired" cues to redirect to complete exercises in supine.      Exercises General Exercises - Upper Extremity Shoulder Flexion: Self ROM;Left;5 reps;Seated General Exercises - Lower Extremity Heel Slides: AROM;Both;10 reps;Supine Hip ABduction/ADduction: AROM;Both;10 reps;Supine Low Level/ICU Exercises Stabilized Bridging: AROM;Both;10 reps;Supine Other Exercises Other Exercises: B chest press with hand clasped and AAROM on L x 10 reps.    General Comments        Pertinent Vitals/Pain Pain Assessment: Faces Pain Score: 7  Faces Pain Scale: Hurts little more Pain Location: R shoulder Pain Descriptors / Indicators: Grimacing;Guarding Pain Intervention(s): Monitored during session;Repositioned    Home Living                      Prior Function            PT Goals (current goals can now be found in the care plan section) Acute Rehab PT Goals Patient Stated Goal: none stated Potential to Achieve Goals: Good Progress towards PT goals: Progressing toward goals    Frequency    Min 3X/week      PT Plan Current plan remains appropriate    Co-evaluation              AM-PAC PT "6 Clicks" Mobility   Outcome Measure  Help needed turning from  your back to your side while in a flat bed without using bedrails?: A Lot Help needed moving from lying on your back to sitting on the side of a flat bed without using bedrails?: A Lot Help needed moving to and from a bed to a chair (including a wheelchair)?: Total Help needed standing up from a chair using your arms (e.g., wheelchair or bedside chair)?: Total Help needed to walk in hospital room?: Total Help needed climbing 3-5 steps with a railing? : Total 6 Click Score: 8    End of Session Equipment Utilized  During Treatment: Gait belt Activity Tolerance: Patient limited by fatigue Patient left: in bed;with call bell/phone within reach;with bed alarm set;with restraints reapplied Nurse Communication: Mobility status PT Visit Diagnosis: Hemiplegia and hemiparesis;Unsteadiness on feet (R26.81);Muscle weakness (generalized) (M62.81);Difficulty in walking, not elsewhere classified (R26.2);Other symptoms and signs involving the nervous system (R29.898) Hemiplegia - Right/Left: Left Hemiplegia - dominant/non-dominant: Dominant Hemiplegia - caused by: Nontraumatic intracerebral hemorrhage     Time: 0960-4540 PT Time Calculation (min) (ACUTE ONLY): 17 min  Charges:  $Therapeutic Exercise: 8-22 mins                     Chad Coleman , PTA Acute Rehabilitation Services Pager [phone] Office [phone]    Chad Coleman Chad Coleman 03/08/2021, 3:37 PM

## 2021-03-08 NOTE — Progress Notes (Signed)
Occupational Therapy Treatment Patient Details Name: Chad Coleman MRN: [id number] DOB: 02/25/1961 Today's Date: 03/08/2021    History of present illness 60 yo male presents to [facility] on 3/28 with acute onset of L weakness, numbness, and L facial droop. CTH R BG ICH, suspect hypertensive.  Fall on 02/28/21. imaging showing increased in R intraparenchymal hematoma with worsening leftward midline shift on 4/7. PMH includes hypertension, tobacco use, noncompliance with antihypertensive medications, and cocaine abuse.   OT comments  Pt making steady progress towards OT goals this session. Pt continues to present with impaired balance, L hemiplegia, decreased activity tolerance and impaired awareness into deficits. Pt currently requires MOD- MAX A +2 for bed mobility and MIN guard - MAX A for static sitting balance with RUE positioned beside pt and posteriorly. Session with a focus on standing tolerance with pt needing MOD A +2 to power into standing from EOB with pt using arm rest on back of recliner to pull up on with RUE. Pt able to complete lateral weight shifts in standing to facilitate higher level functional mobility tasks. Pt would continue to benefit from skilled occupational therapy while admitted and after d/c to address the below listed limitations in order to improve overall functional mobility and facilitate independence with BADL participation. DC plan remains appropriate, will follow acutely per POC.     Follow Up Recommendations  CIR    Equipment Recommendations  Other (comment) (TBD)    Recommendations for Other Services      Precautions / Restrictions Precautions Precautions: Fall Precaution Comments: Pusher, L neglect, impulsive Restrictions Weight Bearing Restrictions: No       Mobility Bed Mobility Overal bed mobility: Needs Assistance Bed Mobility: Rolling Rolling: Min guard;Max assist   Supine to sit: Mod assist;+2 for safety/equipment;HOB elevated Sit to supine:  Max assist;+2 for physical assistance   General bed mobility comments: pt transitioning into long sitting from supine with HOB eleavated but needed MODA to advance LLE to EOB and support trunk during transition, MAX A +2 to return to supine needing assist to safety lower trunk down to supine elevate LLE back to EOB. pt able to roll to L side with min guard assist and total A to roll L to place pt on bed pan    Transfers Overall transfer level: Needs assistance Equipment used: None Transfers: Sit to/from Stand Sit to Stand: Mod assist;+2 physical assistance         General transfer comment: COTA blocking pts LLE and supporting LUE with pt able to power into standing x2 from EOB with MOD A +2 with RUE positioned on arm rests on back of recliner. pt able to shift weight R<>L in standing to facilitate functional weight shifting as precursor to higher level functional mobility with MIN A +2 needing support at trunk for standing balance. pt required MAX cues to elevate trunk and fully shift hips anteriorly into full stand    Balance Overall balance assessment: Needs assistance Sitting-balance support: Single extremity supported;Feet supported Sitting balance-Leahy Scale: Poor Sitting balance - Comments: pt with moments of min guard assist for sitting balance with RUE positioned posteriorly and to pts R side however pt needing MAX A +1 at times d/t strong L lateral lean and pt pushing heavily with RUE Postural control: Left lateral lean Standing balance support: Bilateral upper extremity supported Standing balance-Leahy Scale: Poor Standing balance comment: Reliant on UE support and physical A for postural correction  ADL either performed or assessed with clinical judgement   ADL Overall ADL's : Needs assistance/impaired                         Toilet Transfer: Moderate assistance Toilet Transfer Details (indicate cue type and reason): simulated  via sit<>stand from EOB with back of chair Toileting- Clothing Manipulation and Hygiene: Total assistance;Bed level Toileting - Clothing Manipulation Details (indicate cue type and reason): rolling R<>L     Functional mobility during ADLs: Moderate assistance;+2 for physical assistance (sit<>stand from EOB with pt using back of chair to power up) General ADL Comments: pt continues to present with impaired balance, decreased safety awareness, L hemi and decreased activity tolerance     Vision       Perception     Praxis      Cognition Arousal/Alertness: Awake/alert;Lethargic (initially lethargic but arouses more as session progresses) Behavior During Therapy: Flat affect;Impulsive Overall Cognitive Status: Impaired/Different from baseline Area of Impairment: Attention;Safety/judgement;Awareness;Problem solving                   Current Attention Level: Sustained   Following Commands: Follows one step commands consistently Safety/Judgement: Decreased awareness of safety;Decreased awareness of deficits Awareness: Intellectual Problem Solving: Slow processing;Decreased initiation;Requires verbal cues;Requires tactile cues;Difficulty sequencing General Comments: pt initially lethargic and flat during session however pt following one step commands, pt continues to present with impaired awarness into deficits, especially sitting balance.        Exercises General Exercises - Upper Extremity Shoulder Flexion: Self ROM;Left;5 reps;Seated   Shoulder Instructions       General Comments      Pertinent Vitals/ Pain       Pain Assessment: Faces Faces Pain Scale: Hurts little more Pain Location: R shoulder Pain Descriptors / Indicators: Grimacing;Guarding Pain Intervention(s): Limited activity within patient's tolerance;Monitored during session;Repositioned  Home Living                                          Prior Functioning/Environment               Frequency  Min 2X/week        Progress Toward Goals  OT Goals(current goals can now be found in the care plan section)  Progress towards OT goals: Progressing toward goals  Acute Rehab OT Goals Patient Stated Goal: none stated OT Goal Formulation: With patient Time For Goal Achievement: 03/08/21 Potential to Achieve Goals: Good  Plan Discharge plan remains appropriate;Frequency remains appropriate    Co-evaluation                 AM-PAC OT "6 Clicks" Daily Activity     Outcome Measure   Help from another person eating meals?: A Lot Help from another person taking care of personal grooming?: A Lot Help from another person toileting, which includes using toliet, bedpan, or urinal?: Total Help from another person bathing (including washing, rinsing, drying)?: Total Help from another person to put on and taking off regular upper body clothing?: Total Help from another person to put on and taking off regular lower body clothing?: Total 6 Click Score: 8    End of Session Equipment Utilized During Treatment: Gait belt  OT Visit Diagnosis: Unsteadiness on feet (R26.81);Other abnormalities of gait and mobility (R26.89);Muscle weakness (generalized) (M62.81);Hemiplegia and hemiparesis Hemiplegia - Right/Left: Left Hemiplegia - dominant/non-dominant:  Dominant Hemiplegia - caused by: Cerebral infarction   Activity Tolerance Patient tolerated treatment well   Patient Left in bed;with call bell/phone within reach (on bed pan with call bell within reach)   Nurse Communication Mobility status        Time: 1034-1100 OT Time Calculation (min): 26 min  Charges: OT General Charges $OT Visit: 1 Visit OT Treatments $Therapeutic Activity: 23-37 mins  Harley Alto., COTA/L Acute Rehabilitation Services [phone]    Precious Haws 03/08/2021, 12:48 PM

## 2021-03-08 NOTE — Plan of Care (Signed)
  Problem: Health Behavior/Discharge Planning: Goal: Ability to manage health-related needs will improve Outcome: Progressing   Problem: Nutrition: Goal: Risk of aspiration will decrease Outcome: Progressing Goal: Dietary intake will improve Outcome: Progressing   Problem: Intracerebral Hemorrhage Tissue Perfusion: Goal: Complications of Intracerebral Hemorrhage will be minimized Outcome: Progressing   Problem: Education: Goal: Knowledge of disease or condition will improve Outcome: Progressing Goal: Knowledge of secondary prevention will improve Outcome: Progressing Goal: Knowledge of patient specific risk factors addressed and post discharge goals established will improve Outcome: Progressing Goal: Individualized Educational Video(s) Outcome: Progressing   Problem: Safety: Goal: Non-violent Restraint(s) Outcome: Progressing   Problem: Activity: Goal: Risk for activity intolerance will decrease Outcome: Progressing   Problem: Nutrition: Goal: Adequate nutrition will be maintained Outcome: Progressing   Problem: Pain Managment: Goal: General experience of comfort will improve Outcome: Progressing

## 2021-03-08 NOTE — Plan of Care (Signed)
  Problem: Coping: Goal: Will verbalize positive feelings about self Outcome: Progressing Goal: Will identify appropriate support needs Outcome: Progressing   Problem: Health Behavior/Discharge Planning: Goal: Ability to manage health-related needs will improve Outcome: Progressing   Problem: Self-Care: Goal: Ability to participate in self-care as condition permits will improve Outcome: Progressing Goal: Verbalization of feelings and concerns over difficulty with self-care will improve Outcome: Progressing Goal: Ability to communicate needs accurately will improve Outcome: Progressing   Problem: Nutrition: Goal: Risk of aspiration will decrease Outcome: Progressing Goal: Dietary intake will improve Outcome: Progressing   Problem: Intracerebral Hemorrhage Tissue Perfusion: Goal: Complications of Intracerebral Hemorrhage will be minimized Outcome: Progressing   Problem: Safety: Goal: Non-violent Restraint(s) Outcome: Progressing   Problem: Education: Goal: Knowledge of General Education information will improve Description: Including pain rating scale, medication(s)/side effects and non-pharmacologic comfort measures Outcome: Progressing   Problem: Health Behavior/Discharge Planning: Goal: Ability to manage health-related needs will improve Outcome: Progressing   Problem: Clinical Measurements: Goal: Ability to maintain clinical measurements within normal limits will improve Outcome: Progressing Goal: Will remain free from infection Outcome: Progressing Goal: Diagnostic test results will improve Outcome: Progressing Goal: Respiratory complications will improve Outcome: Progressing Goal: Cardiovascular complication will be avoided Outcome: Progressing   Problem: Activity: Goal: Risk for activity intolerance will decrease Outcome: Progressing   Problem: Nutrition: Goal: Adequate nutrition will be maintained Outcome: Progressing   Problem: Coping: Goal: Level  of anxiety will decrease Outcome: Progressing   Problem: Elimination: Goal: Will not experience complications related to bowel motility Outcome: Progressing Goal: Will not experience complications related to urinary retention Outcome: Progressing   Problem: Pain Managment: Goal: General experience of comfort will improve Outcome: Progressing   Problem: Safety: Goal: Ability to remain free from injury will improve Outcome: Progressing   Problem: Skin Integrity: Goal: Risk for impaired skin integrity will decrease Outcome: Progressing   Problem: Education: Goal: Knowledge of disease or condition will improve Outcome: Progressing Goal: Knowledge of secondary prevention will improve Outcome: Progressing Goal: Knowledge of patient specific risk factors addressed and post discharge goals established will improve Outcome: Progressing Goal: Individualized Educational Video(s) Outcome: Progressing

## 2021-03-09 DIAGNOSIS — M25511 Pain in right shoulder: Secondary | ICD-10-CM

## 2021-03-09 LAB — GLUCOSE, CAPILLARY
Glucose-Capillary: 120 mg/dL — ABNORMAL HIGH (ref 70–99)
Glucose-Capillary: 110 mg/dL — ABNORMAL HIGH (ref 70–99)
Glucose-Capillary: 128 mg/dL — ABNORMAL HIGH (ref 70–99)
Glucose-Capillary: 155 mg/dL — ABNORMAL HIGH (ref 70–99)

## 2021-03-09 MED ORDER — LISINOPRIL 20 MG PO TABS: 30.0000 mg | ORAL_TABLET | Freq: Every day | ORAL | Status: DC

## 2021-03-09 MED ORDER — LISINOPRIL 20 MG PO TABS
20.0000 mg | ORAL_TABLET | Freq: Once | ORAL | Status: AC
  Administered 2021-03-09: 20 mg via ORAL
  Filled 2021-03-09: qty 1

## 2021-03-09 NOTE — Progress Notes (Addendum)
[facility] PROGRESS NOTE  Chad Coleman TTS:[id number] DOB: 02-22-1961 DOA: 02/21/2021 PCP: Patient, No Pcp Per (Inactive)  Status: Remains inpatient appropriate because:Altered mental status, Unsafe d/c plan and Inpatient level of care appropriate due to severity of illness   Dispo: The patient is from: Home              Anticipated d/c is to: SNF              Patient currently is not medically stable to d/c.               Barriers to DC: No insurance.  Medicaid has not been filed apparently because there is no POA in place to complete paperwork.  Plan is to contact financial counseling to determine if patient sister could qualify to assist in completing paperwork. NOT A CANDIDATE FOR CIR DUE TO NO 24/7 CARE POST DC   Difficult to place patient Yes   Level of care: Med-Surg  Code Status: Full Family Communication: 4/13 Sister Chad Coleman [phone]. Requested my work email so she could send FMLA forms for pt. She stated she was not his POA and to her knowledge he did not have a designated POA. I told her LCSW would contact her this week to discuss guardianship process DVT prophylaxis: SCDs Vaccination status: Unknown  Foley catheter: Condom catheter  HPI: 60 year old gentleman prior history of hypertension, cocaine use, tobacco abuse admitted on 02/21/2021 with a code stroke was found to have intracranial hemorrhage in the right basal ganglia secondary to hypertension and cocaine use.  Patient was transferred from neurology service to hospitalist service on 02/26/2021. Therapy evaluations recommending SNF.  pt had a fall on 03/03/21, he underwent CT head showing increased size of the intraparenchymal hematoma with worsening leftward midline shift, now measuring 8 mm.Effaced right lateral ventricle, which may become entrapped and predisposes to hydrocephalus. He was transferred to ICU overnight, neuro surgery consulted and a repeat CT head ordered which was essentially stable .   Pt had another fall in the room on 03/05/21, repeat CT head without contrast showed Unchanged appearance of right basal ganglia intraparenchymal hematoma with 7 mm of leftward midline shift.  Subjective: Awake and alert.  Nursing staff at bedside performing a.m. care.  Patient reports no significant improvement and right shoulder discomfort after the addition of heat pads and Voltaren gel.  Objective: Vitals:   03/09/21 0344 03/09/21 0700  BP: (!) 155/85 (!) 142/99  Pulse: 78   Resp: 18 18  Temp: 98.2 F (36.8 C) 98.5 F (36.9 C)  SpO2: 100% 98%    Intake/Output Summary (Last 24 hours) at 03/09/2021 0758 Last data filed at 03/09/2021 0740 Gross per 24 hour  Intake 600 ml  Output 1850 ml  Net -1250 ml   Filed Weights   02/25/21 1357  Weight: 73.2 kg    Exam:  Constitutional: Alert, no acute distress, mildly uncomfortable secondary to ongoing right shoulder pain Respiratory: Anterior lung sounds are clear.  Room air Cardiovascular: S1-S2, no JVD, no peripheral edema Abdomen:   LBM 4/12, soft nontender with normoactive bowel sounds.  Variable oral intake  Neurologic: CN 2-12 grossly intact. Sensation intact, DTR normal. Strength 5/5 on the right with a dense, insensate left hemiplegia involving both the upper and lower extremity. Psychiatric: Alert and oriented x3.  Pleasant affect.  Did poorly on recent SLUMS screening by speech therapy with a score of 15/30   Assessment/Plan: Acute problems: Intracranial hemorrhage with associated brain injury Optimal  Blood pressure control, keep sbp <140/90 mmhg.  Echocardiogram showed left ventricle ejection fraction 65 to 70% with grade 1 diastolic dysfunction. LDL is 52. Speech evaluation recommending dysphagia 2 diet with thin liquid. Pt had multiple falls over the last 3 days, repeat CT without contrast showed stable hematoma. Bedside sitter ordered. Continues to have issues with impulsivity secondary to brain injury.  Have  initiated low-dose Zoloft and Depakote. Cognitive eval per OT/SLP has demonstrated deficits (see below) SLP COGNITIVE EVAL 4/8: Overall, his speech-language-cognitive function appears similar to assessment on 3/29 with exception of working memory. Severity of dysarthria was unchanged and is mild-mod in conversation. Expressive and receptive language is functional. On SLUMS assessment he scored a 15 out of possible 30 requiring cues to retrieve 3/5 on word recall, difficulty with mental calculations, accurate with 1 of 4 questions for auditory recall of paragraph. ST will continue treatment to increase independence. OT COGNITIVE EVAL 4/8:  Arousal/Alertness: Awake/alert Behavior During Therapy: Flat affect Overall Cognitive Status: Impaired/Different from baseline Area of Impairment: Attention;Memory;Following commands;Safety/judgement;Awareness;Problem solving Current Attention Level: Sustained (short time) Memory: Decreased short-term memory;Decreased recall of precautions Following Commands: Follows one step commands consistently Safety/Judgement: Decreased awareness of safety;Decreased awareness of deficits Awareness: Intellectual Problem Solving: Slow processing;Decreased initiation;Requires verbal cues;Requires tactile cues;Difficulty sequencing General Comments: Pt impulsive and requiring simple, direct cues. Follow one step commands with calm environement. At end of session, providsing questioning cues to have pt review session, pt abel to report what he did and why with Mod questioning cues    Mechanical fall on 03/03/21 and 03/05/21. A ct head without contrast was obtained, showed  increased size of the intraparenchymal hematoma with worsening leftward midline shift, now measuring 8 mm.Effaced right lateral ventricle, which may become entrapped and predisposes to hydrocephalus. He was transferred to ICU overnight, neuro surgery consulted and a repeat CT head ordered which was essentially  stable .  Pt transferred back to the floor.  Bed side sitter ordered for safety.   Right anterior shoulder pain -No crepitus on exam -Suspect mild muscle strain as this is extremity patient uses to reposition self in bed post stroke -Continue heat and Voltaren gel -Encouraged to also use RLE in addition to RUE when repositioning  Physical Deconditioning PT eval 4/12:    Pt supine in bed on arrival this session.  Pt reports fatigue after working with OTA but agreeable to PT session this pm.  Focused on bed level exercises.  Provided resistance to LE exercises with knee extension and hip adduction.  Continue to recommend snf.        OT eval 4/12:   Pt making steady progress towards OT goals this session. Pt continues to present with impaired balance, L hemiplegia, decreased activity tolerance and impaired awareness into deficits. Pt currently requires MOD- MAX A +2 for bed mobility and MIN guard - MAX A for static sitting balance with RUE positioned beside pt and posteriorly. Session with a focus on standing tolerance with pt needing MOD A +2 to power into standing from EOB with pt using arm rest on back of recliner to pull up on with RUE. Pt able to complete lateral weight shifts in standing to facilitate higher level functional mobility tasks. Pt would continue to benefit from skilled occupational therapy while admitted and after d/c to address the below listed limitations in order to improve overall functional mobility and facilitate independence with BADL participation. DC plan remains appropriate, will follow acutely per POC.  Hypertension Not well controlled.  Increase hydralazine 50 mg TID.  in addition to Norvasc 10 mg, clonidine 0.1 mg daily, isosorbide 15 mg daily, lisinopril 10 mg daily  Hyperlipidemia Continue with statin.   Type 2 diabetes mellitus uncontrolled with hyperglycemia Hemoglobin A1 c is 6.4. not on insuliin at home.  Current CBGs well-controlled over  the past 24 hours with range of 91-180 Continue with Metformin. Continue with SSI.   Hyponatremia Much improved to 134.  Probably secondary to poor oral intake. Thiazide diuretic has been discontinued. Serum osmolality was slightly low at 274 with low normal 275. 4/11 no indication to continue fluid restriction therefore this was discontinued.     Other problems: Hypokalemia Replaced, repeat level wnl.   Mild abdominal pain: abd film is negative for obstruction.   Mild elevation of liver enzymes.  [place] ABD ordered and is negative.    Data Reviewed: Basic Metabolic Panel: Recent Labs  Lab 03/03/21 0910 03/06/21 1110  NA 134* 134*  K 4.0 4.1  CL 99 100  CO2 23 22  GLUCOSE 120* 114*  BUN 10 13  CREATININE 1.00 0.95  CALCIUM 9.3 9.1   Liver Function Tests: No results for input(s): AST, ALT, ALKPHOS, BILITOT, PROT, ALBUMIN in the last 168 hours. No results for input(s): LIPASE, AMYLASE in the last 168 hours. No results for input(s): AMMONIA in the last 168 hours. CBC: Recent Labs  Lab 03/06/21 1110  WBC 9.3  NEUTROABS 6.8  HGB 15.4  HCT 46.2  MCV 87.3  PLT 400   Cardiac Enzymes: No results for input(s): CKTOTAL, CKMB, CKMBINDEX, TROPONINI in the last 168 hours. BNP (last 3 results) No results for input(s): BNP in the last 8760 hours.  ProBNP (last 3 results) No results for input(s): PROBNP in the last 8760 hours.  CBG: Recent Labs  Lab 03/08/21 0606 03/08/21 1140 03/08/21 1547 03/08/21 2059 03/09/21 0603  GLUCAP 119* 124* 132* 101* 120*    No results found for this or any previous visit (from the past 240 hour(s)).   Studies: No results found.  Scheduled Meds: . amLODipine  10 mg Oral Daily  . atorvastatin  20 mg Oral Daily  . cloNIDine  0.1 mg Oral Daily  . diclofenac Sodium  4 g Topical QID  . feeding supplement  1 Container Oral TID BM  . hydrALAZINE  50 mg Oral Q8H  . influenza vac split quadrivalent PF  0.5 mL Intramuscular  Tomorrow-1000  . insulin aspart  0-5 Units Subcutaneous QHS  . insulin aspart  0-9 Units Subcutaneous TID WC  . isosorbide mononitrate  15 mg Oral Daily  . lisinopril  10 mg Oral Daily  . metFORMIN  500 mg Oral Q breakfast  . pantoprazole  40 mg Oral QHS  . polyethylene glycol  17 g Oral Daily  . senna-docusate  1 tablet Oral BID  . sertraline  25 mg Oral Daily  . valproic acid  250 mg Oral BID   Continuous Infusions:  Active Problems:   Intraparenchymal hemorrhage of brain (HCC)   Hypertension   Tobacco abuse   Hyponatremia   Leukocytosis   Controlled type 2 diabetes mellitus with hyperglycemia, without long-term current use of insulin ([place])   Brain injury with brief loss of consciousness [facility])   Consultants:  Neurology  Neurosurgery  Procedures:  Echocardiogram  Antibiotics: Anti-infectives (From admission, onward)   None       Time spent: 35 minutes    Erin Hearing ANP  [facility] 7 am - 330 pm/M-F for direct patient care and secure chat Please refer to Amion for contact info 16  days

## 2021-03-10 DIAGNOSIS — I159 Secondary hypertension, unspecified: Secondary | ICD-10-CM

## 2021-03-10 LAB — GLUCOSE, CAPILLARY
Glucose-Capillary: 147 mg/dL — ABNORMAL HIGH (ref 70–99)
Glucose-Capillary: 146 mg/dL — ABNORMAL HIGH (ref 70–99)
Glucose-Capillary: 119 mg/dL — ABNORMAL HIGH (ref 70–99)
Glucose-Capillary: 127 mg/dL — ABNORMAL HIGH (ref 70–99)

## 2021-03-10 MED ORDER — LISINOPRIL 20 MG PO TABS
40.0000 mg | ORAL_TABLET | Freq: Every day | ORAL | Status: DC
  Administered 2021-03-10 – 2021-04-02 (×24): 40 mg via ORAL
  Filled 2021-03-10 (×25): qty 2

## 2021-03-10 NOTE — Progress Notes (Addendum)
Progress Note    Chad Coleman   GEX:[id number]  DOB: 1961/02/25  DOA: 02/21/2021     17  PCP: Patient, No Pcp Per (Inactive)  CC: left weakness, numbness, left facial droop  Hospital Course: [name] is a 60 year old male with PMH hypertension, cocaine use, tobacco abuse admitted on 02/21/2021 with a code stroke was found to have intracranial hemorrhage in the right basal ganglia secondary to hypertension and cocaine use. Patient was transferred from neurology service to hospitalist service on 02/26/2021. Therapy evaluations recommending SNF. He had a fall on 03/03/21, he underwentCT head showing increased size of the intraparenchymal hematoma with worsening leftward midline shift, measuring 8 mm, effaced right lateral ventricle. He was transferred to [facility], neurosurgery consulted and a repeat CT head was ordered which was essentially stable . Pt had another fall in the room on 03/05/21, repeat CT head without contrast showedunchanged appearance of right basal ganglia intraparenchymal hematoma with 7 mm of leftward midline shift. He was ordered to have a Air cabin crew and waist belt for ongoing safety during hospitalization.  He exhibited some impulsive behavior and was redirected as needed.  Interval History:  No events overnight.  Resting in bed comfortably this morning.  Does spill food on him when trying to eat with his right hand.  Continues to have dense left-sided hemiplegia.  ROS: Constitutional: negative for chills and fevers, Respiratory: negative for cough, Cardiovascular: negative for chest pain and Gastrointestinal: negative for abdominal pain  Assessment & Plan:  Hypertension - still not at goal (160s-170s / 80s-90s); patient on 5 anti-hypertensives at non-max doses - need to ultimately d/c clonidine - increase lisinopril further (only at 10 mg daily currently) - continue hydralazine, norvasc, imdur; will adjust further after watching changes from lisinopril and  stopping clonidine  Intracranial hemorrhage with associated brain injury Mechanical falls, 4/7/22and 03/05/21. - Goal SBP <140/90  - Echo showed left EF 65 - 70%, Gr 1 DD - LDL is 52 - Speech evaluation recommending dysphagia 2 dietwith thin liquid. - impulsive and multiple falls; continue restraints and sitter as needed - continue Zoloft and Depakote.  Right anterior shoulder pain -No crepitus on exam -Suspect mild muscle strain as this is extremity patient uses to reposition self in bed post stroke -Continue heat and Voltaren gel -Encouraged to also use RLE in addition to RUE when repositioning  Physical Deconditioning - awaiting SNF  Hyperlipidemia Continue with statin.  Type 2 diabetes mellitus uncontrolled with hyperglycemia Hemoglobin A1 c is 6.4% Continue with Metformin. Continue with SSI.   Hyponatremia Probably secondary to poor oral intake. Thiazide diuretic has been discontinued.  Hypokalemia Replaced  Mild abdominal pain abd film is negative for obstruction  Mild elevation of liver enzymes [place] ABD ordered and is negative.   Old records reviewed in assessment of this patient  Antimicrobials: n/a  DVT prophylaxis: Place and maintain sequential compression device Start: 03/07/21 0801   Code Status:   Code Status: Full Code Family Communication:   Disposition Plan: Status is: Inpatient  Remains inpatient appropriate because:Inpatient level of care appropriate due to severity of illness   Dispo: The patient is from: Home              Anticipated d/c is to: SNF              Patient currently is medically stable to d/c.   Difficult to place patient Yes  Risk of unplanned readmission score: Unplanned Admission- Pilot do not use: 16.94   Objective:  Blood pressure 131/84, pulse 80, temperature 98.2 F (36.8 C), temperature source Oral, resp. rate 16, height 5\' 9"  (1.753 m), weight 73.2 kg, SpO2 100 %.  Examination: General appearance:  alert, cooperative and no distress Head: Normocephalic, without obvious abnormality, atraumatic Eyes: EOMI Lungs: clear to auscultation bilaterally Heart: regular rate and rhythm and S1, S2 normal Abdomen: normal findings: bowel sounds normal and soft, non-tender Extremities: no edema Skin: mobility and turgor normal Neurologic: Left-sided hemiplegia.  Sensation intact.  Follows commands.  Consultants:   Neurosurgery -signed off  Procedures:     Data Reviewed: I have personally reviewed following labs and imaging studies Results for orders placed or performed during the hospital encounter of 02/21/21 (from the past 24 hour(s))  Glucose, capillary     Status: Abnormal   Collection Time: 03/09/21 11:49 AM  Result Value Ref Range   Glucose-Capillary 110 (H) 70 - 99 mg/dL  Glucose, capillary     Status: Abnormal   Collection Time: 03/09/21  4:42 PM  Result Value Ref Range   Glucose-Capillary 128 (H) 70 - 99 mg/dL  Glucose, capillary     Status: Abnormal   Collection Time: 03/09/21  9:33 PM  Result Value Ref Range   Glucose-Capillary 155 (H) 70 - 99 mg/dL  Glucose, capillary     Status: Abnormal   Collection Time: 03/10/21  5:58 AM  Result Value Ref Range   Glucose-Capillary 147 (H) 70 - 99 mg/dL    No results found for this or any previous visit (from the past 240 hour(s)).   Radiology Studies: No results found. CT HEAD WO CONTRAST  Final Result    CT HEAD WO CONTRAST  Final Result    CT HEAD WO CONTRAST  Final Result    US Abdomen Complete  Final Result    DG Abd 2 Views  Final Result    MR BRAIN W WO CONTRAST  Final Result    CT HEAD WO CONTRAST  Final Result    CT ANGIO NECK CODE STROKE  Final Result  Addendum 1 of 1  ADDENDUM REPORT: 02/21/2021 18:37    ADDENDUM:  These results were called by telephone at the time of interpretation  on 02/21/2021 at 6:35 pm to provider [facility] , who verbally  acknowledged these results.    Additionally,  given the presence of retained metallic fragments  within the right neck, a contrast-enhanced neck CT (rather than an  MRI) should be performed for further evaluation of the described  left neck mass. This was also discussed with Dr. Donnetta Simpers  at 6:35 p.m. on 02/21/2021.      Electronically Signed    By: Kellie Simmering DO    On: 02/21/2021 18:37      Final    CT ANGIO HEAD CODE STROKE  Final Result  Addendum 1 of 1  ADDENDUM REPORT: 02/21/2021 18:37    ADDENDUM:  These results were called by telephone at the time of interpretation  on 02/21/2021 at 6:35 pm to provider [facility] , who verbally  acknowledged these results.    Additionally, given the presence of retained metallic fragments  within the right neck, a contrast-enhanced neck CT (rather than an  MRI) should be performed for further evaluation of the described  left neck mass. This was also discussed with Dr. Donnetta Simpers  at 6:35 p.m. on 02/21/2021.      Electronically Signed    By: Kellie Simmering DO    On: 02/21/2021  18:37      Final    CT HEAD CODE STROKE WO CONTRAST  Final Result    DG Swallowing Func-Speech Pathology    (Results Pending)    Scheduled Meds: . amLODipine  10 mg Oral Daily  . atorvastatin  20 mg Oral Daily  . cloNIDine  0.1 mg Oral Daily  . diclofenac Sodium  4 g Topical QID  . feeding supplement  1 Container Oral TID BM  . hydrALAZINE  50 mg Oral Q8H  . insulin aspart  0-5 Units Subcutaneous QHS  . insulin aspart  0-9 Units Subcutaneous TID WC  . isosorbide mononitrate  15 mg Oral Daily  . lisinopril  30 mg Oral Daily  . metFORMIN  500 mg Oral Q breakfast  . pantoprazole  40 mg Oral QHS  . polyethylene glycol  17 g Oral Daily  . senna-docusate  1 tablet Oral BID  . sertraline  25 mg Oral Daily  . valproic acid  250 mg Oral BID   PRN Meds: acetaminophen **OR** acetaminophen (TYLENOL) oral liquid 160 mg/5 mL **OR** acetaminophen, acetaminophen-codeine, alum & mag  hydroxide-simeth, hydroxypropyl methylcellulose / hypromellose, labetalol, ondansetron (ZOFRAN) IV, traMADol Continuous Infusions:   LOS: 17 days    Dwyane Dee, MD [facility] 03/10/2021, 7:20 AM

## 2021-03-10 NOTE — NC FL2 (Signed)
[place] LEVEL OF CARE SCREENING TOOL     IDENTIFICATION  Patient Name: Chad Coleman Birthdate: 10-Jan-1961 Sex: male Admission Date (Current Location): 02/21/2021  [facility] and [place] Number:  Herbalist and Address:  [facility] [place] [address] [place], [place] [address]      Provider Number: [id number]  Attending Physician Name and Address:  Dwyane Dee, MD  Relative Name and Phone Number:  [name] - [phone]    Current Level of Care: Hospital Recommended Level of Care: [place] Prior Approval Number:    Date Approved/Denied:   PASRR Number: [id number]  Discharge Plan: SNF    Current Diagnoses: Patient Active Problem List   Diagnosis Date Noted  . Acute pain of right shoulder   . Brain injury with brief loss of consciousness ([place])   . Hypertension   . Tobacco abuse   . Hyponatremia   . Leukocytosis   . Controlled type 2 diabetes mellitus with hyperglycemia, without long-term current use of insulin ([place])   . Intraparenchymal hemorrhage of brain ([place]) 02/21/2021    Orientation RESPIRATION BLADDER Height & Weight     Self,Time,Situation,Place  Normal Incontinent,External catheter Weight: 161 lb 6 oz (73.2 kg) Height:  5\' 9"  (175.3 cm)  BEHAVIORAL SYMPTOMS/MOOD NEUROLOGICAL BOWEL NUTRITION STATUS      Incontinent Diet (Normal)  AMBULATORY STATUS COMMUNICATION OF NEEDS Skin   Extensive Assist Verbally Normal                       Personal Care Assistance Level of Assistance  Bathing,Feeding,Dressing Bathing Assistance: Maximum assistance Feeding assistance: Limited assistance Dressing Assistance: Maximum assistance     Functional Limitations Info  Sight,Hearing,Speech Sight Info: Adequate Hearing Info: Adequate Speech Info: Adequate    SPECIAL CARE FACTORS FREQUENCY  PT (By licensed PT),OT (By licensed OT)     PT Frequency: 5x weekly OT Frequency: 5x weekly             Contractures Contractures Info: Not present    Additional Factors Info  Allergies,Code Status Code Status Info: Full Allergies Info: None           Current Medications (03/10/2021):  This is the current hospital active medication list Current Facility-Administered Medications  Medication Dose Route Frequency Provider Last Rate Last Admin  . acetaminophen (TYLENOL) tablet 650 mg  650 mg Oral Q4H PRN Hosie Poisson, MD   650 mg at 03/10/21 0134   Or  . acetaminophen (TYLENOL) 160 MG/5ML solution 650 mg  650 mg Per Tube Q4H PRN Hosie Poisson, MD       Or  . acetaminophen (TYLENOL) suppository 650 mg  650 mg Rectal Q4H PRN Hosie Poisson, MD      . acetaminophen-codeine (TYLENOL #3) 300-30 MG per tablet 2 tablet  2 tablet Oral Q6H PRN Hosie Poisson, MD   2 tablet at 03/04/21 2101  . alum & mag hydroxide-simeth (MAALOX/MYLANTA) 200-200-20 MG/5ML suspension 30 mL  30 mL Oral Q4H PRN Hosie Poisson, MD   30 mL at 02/25/21 1322  . amLODipine (NORVASC) tablet 10 mg  10 mg Oral Daily Hosie Poisson, MD   10 mg at 03/09/21 0932  . atorvastatin (LIPITOR) tablet 20 mg  20 mg Oral Daily Hosie Poisson, MD   20 mg at 03/09/21 0931  . diclofenac Sodium (VOLTAREN) 1 % topical gel 4 g  4 g Topical QID Samella Parr, NP   4 g at  03/09/21 2129  . feeding supplement (BOOST / RESOURCE BREEZE) liquid 1 Container  1 Container Oral TID BM Hosie Poisson, MD   1 Container at 03/09/21 2028  . hydrALAZINE (APRESOLINE) tablet 50 mg  50 mg Oral Q8H Hosie Poisson, MD   50 mg at 03/10/21 0540  . hydroxypropyl methylcellulose / hypromellose (ISOPTO TEARS / GONIOVISC) 2.5 % ophthalmic solution 2 drop  2 drop Both Eyes PRN Hosie Poisson, MD   2 drop at 03/04/21 0143  . insulin aspart (novoLOG) injection 0-5 Units  0-5 Units Subcutaneous QHS Erin Hearing L, NP      . insulin aspart (novoLOG) injection 0-9 Units  0-9 Units Subcutaneous TID WC Hosie Poisson, MD   1 Units at 03/10/21 0654  . isosorbide mononitrate  (IMDUR) 24 hr tablet 15 mg  15 mg Oral Daily Hosie Poisson, MD   15 mg at 03/09/21 0932  . labetalol (NORMODYNE) injection 20 mg  20 mg Intravenous Q2H PRN Hosie Poisson, MD   20 mg at 03/10/21 0838  . lisinopril (ZESTRIL) tablet 40 mg  40 mg Oral Daily Dwyane Dee, MD      . metFORMIN (GLUCOPHAGE) tablet 500 mg  500 mg Oral Q breakfast Hosie Poisson, MD   500 mg at 03/10/21 0842  . ondansetron (ZOFRAN) injection 4 mg  4 mg Intravenous Q6H PRN Ashok Pall, MD   4 mg at 03/04/21 1641  . pantoprazole (PROTONIX) EC tablet 40 mg  40 mg Oral QHS Hosie Poisson, MD   40 mg at 03/09/21 2130  . polyethylene glycol (MIRALAX / GLYCOLAX) packet 17 g  17 g Oral Daily Hosie Poisson, MD   17 g at 03/09/21 0933  . senna-docusate (Senokot-S) tablet 1 tablet  1 tablet Oral BID Hosie Poisson, MD   1 tablet at 03/09/21 2130  . sertraline (ZOLOFT) tablet 25 mg  25 mg Oral Daily Samella Parr, NP   25 mg at 03/09/21 0956  . traMADol (ULTRAM) tablet 100 mg  100 mg Oral Q6H PRN Hosie Poisson, MD   100 mg at 03/07/21 1241  . valproic acid (DEPAKENE) 250 MG capsule 250 mg  250 mg Oral BID Samella Parr, NP   250 mg at 03/09/21 2138     Discharge Medications: Please see discharge summary for a list of discharge medications.  Relevant Imaging Results:  Relevant Lab Results:   Additional Information SSN: [id number]  Archie Endo, LCSW

## 2021-03-10 NOTE — Progress Notes (Signed)
  Speech Language Pathology Treatment: Dysphagia;Cognitive-Linquistic  Patient Details Name: Chad Coleman MRN: [id number] DOB: June 22, 1961 Today's Date: 03/10/2021 Time: 1884-1660 SLP Time Calculation (min) (ACUTE ONLY): 10 min  Assessment / Plan / Recommendation Clinical Impression  Chad Coleman just returned to bed minutes before therapists arrival and able to maintain awake state with intermittent verbal stimulation needed. Have been working towards texture upgrade from Dys 2 however he is continuing to pocket on the left side. Trial with Dys 3 (mechanical soft) resulted in partially masticated peach on tongue and lip stating he cleared. Will leave on Dys 2 (minced) although his awareness is decreased and denies difficulty masticating.  Cognitive intervention limited due to dozing off. Able to demonstrate recall of disposition plan accurately (going to "rehab" in [place] although not aware of applications sister is completing or HCPOA.   HPI HPI: 60 y.o. male with a medical history significant for hypertension, tobacco use, noncompliance with antihypertensive medications, and cocaine abuse who presented to the ED via EMS as a Code Stroke for evaluation of acute onset of left-sided weakness, numbness, and left facial droop. Dx right basal ganglia ICH. Evening of 4/7 pt fell from bed with CT revealing Increased size of 5.8 x 3.6 cm right intraparenchymal hematoma  with worsening leftward midline shift and eval. Cognitive re-assessment ordered.      SLP Plan  Continue with current plan of care       Recommendations  Diet recommendations: Dysphagia 2 (fine chop);Thin liquid Liquids provided via: Cup;Straw Medication Administration: Whole meds with puree Supervision: Staff to assist with self feeding;Patient able to self feed Compensations: Minimize environmental distractions;Slow rate;Small sips/bites;Lingual sweep for clearance of pocketing Postural Changes and/or Swallow Maneuvers:  Seated upright 90 degrees                Oral Care Recommendations: Oral care BID Follow up Recommendations: Skilled Nursing facility SLP Visit Diagnosis: Cognitive communication deficit (R41.841);Dysarthria and anarthria (R47.1);Dysphagia, unspecified (R13.10) Plan: Continue with current plan of care                       [name] 03/10/2021, 2:55 PM  Orbie Pyo Colvin Caroli.Ed Risk analyst [phone] Office [phone]

## 2021-03-10 NOTE — Progress Notes (Signed)
CSW spoke with patient's sister [name] to discuss discharge planning - [name] agreed to pursue HCPOA for patient. CSW explained that patient's orientation and physical limitations due to recent stroke. [name] wants patient placed in a facility for rehabilitation as she cannot care for him at her home. [name] is coming to [place] next weekend to clean out the patient's room at the boarding house he was living at. [name] states she will visit the patient here at the hospital and will complete HCPOA paperwork at that time. [name] unable to come before that due to her work schedule. CSW encouraged [name] to reach out for questions or concerns if they arise, she was agreeable.  Madilyn Fireman, MSW, LCSW Transitions of Care  Clinical Social Worker II [phone]

## 2021-03-10 NOTE — Plan of Care (Signed)
  Problem: Coping: Goal: Will verbalize positive feelings about self Outcome: Progressing Goal: Will identify appropriate support needs Outcome: Progressing   Problem: Health Behavior/Discharge Planning: Goal: Ability to manage health-related needs will improve Outcome: Progressing   Problem: Self-Care: Goal: Ability to participate in self-care as condition permits will improve Outcome: Progressing Goal: Verbalization of feelings and concerns over difficulty with self-care will improve Outcome: Progressing Goal: Ability to communicate needs accurately will improve Outcome: Progressing   Problem: Nutrition: Goal: Risk of aspiration will decrease Outcome: Progressing Goal: Dietary intake will improve Outcome: Progressing   Problem: Intracerebral Hemorrhage Tissue Perfusion: Goal: Complications of Intracerebral Hemorrhage will be minimized Outcome: Progressing   Problem: Safety: Goal: Non-violent Restraint(s) Outcome: Progressing   Problem: Education: Goal: Knowledge of General Education information will improve Description: Including pain rating scale, medication(s)/side effects and non-pharmacologic comfort measures Outcome: Progressing   Problem: Health Behavior/Discharge Planning: Goal: Ability to manage health-related needs will improve Outcome: Progressing   Problem: Clinical Measurements: Goal: Ability to maintain clinical measurements within normal limits will improve Outcome: Progressing Goal: Will remain free from infection Outcome: Progressing Goal: Diagnostic test results will improve Outcome: Progressing Goal: Respiratory complications will improve Outcome: Progressing Goal: Cardiovascular complication will be avoided Outcome: Progressing   Problem: Activity: Goal: Risk for activity intolerance will decrease Outcome: Progressing   Problem: Nutrition: Goal: Adequate nutrition will be maintained Outcome: Progressing   Problem: Coping: Goal: Level  of anxiety will decrease Outcome: Progressing   Problem: Elimination: Goal: Will not experience complications related to bowel motility Outcome: Progressing Goal: Will not experience complications related to urinary retention Outcome: Progressing   Problem: Pain Managment: Goal: General experience of comfort will improve Outcome: Progressing   Problem: Safety: Goal: Ability to remain free from injury will improve Outcome: Progressing   Problem: Skin Integrity: Goal: Risk for impaired skin integrity will decrease Outcome: Progressing   Problem: Education: Goal: Knowledge of disease or condition will improve Outcome: Progressing Goal: Knowledge of secondary prevention will improve Outcome: Progressing Goal: Knowledge of patient specific risk factors addressed and post discharge goals established will improve Outcome: Progressing Goal: Individualized Educational Video(s) Outcome: Progressing   Problem: Safety: Goal: Non-violent Restraint(s) Outcome: Progressing

## 2021-03-10 NOTE — Progress Notes (Signed)
Physical Therapy Treatment Patient Details Name: Chad Coleman MRN: [id number] DOB: 02/17/1961 Today's Date: 03/10/2021    History of Present Illness 60 yo male presents to [facility] on 3/28 with acute onset of L weakness, numbness, and L facial droop. CTH R BG ICH, suspect hypertensive.  Fall on 02/28/21. imaging showing increased in R intraparenchymal hematoma with worsening leftward midline shift on 4/7. PMH includes hypertension, tobacco use, noncompliance with antihypertensive medications, and cocaine abuse.    PT Comments    Patient progressing some with sitting balance during session and with cervical ROM.  His upright orientation and  Proprioception are also affecting balance at EOB.  Patient not safe for ambulation at this time due to awareness, attention and L LE weakness.  Continue to feet he will benefit from skilled PT in the acute setting and follow up SNF level rehab at d/c.   Follow Up Recommendations  Supervision/Assistance - 24 hour;SNF     Equipment Recommendations  Other (comment)    Recommendations for Other Services       Precautions / Restrictions Precautions Precautions: Fall Precaution Comments: Pusher, L neglect, impulsive    Mobility  Bed Mobility Overal bed mobility: Needs Assistance Bed Mobility: Rolling Rolling: Max assist Sidelying to sit: Max assist       General bed mobility comments: assist to flex L knee and turn on R side; lifting assist for trunk then stabilizing as pushing to R with R lateral head tilt    Transfers Overall transfer level: Needs assistance   Transfers: Sit to/from Stand;Stand Pivot Transfers Sit to Stand: Mod assist;+2 physical assistance Stand pivot transfers: Max assist;+2 safety/equipment       General transfer comment: pt with some weight acceptance on L LE with sit to stand, though reportedly afraid feeling like he will fall to the R.  +2 for safety with moving hips to chair and for controlled  landing  Ambulation/Gait                 Stairs             Wheelchair Mobility    Modified Rankin (Stroke Patients Only) Modified Rankin (Stroke Patients Only) Pre-Morbid Rankin Score: No symptoms Modified Rankin: Severe disability     Balance Overall balance assessment: Needs assistance Sitting-balance support: Feet supported Sitting balance-Leahy Scale: Poor Sitting balance - Comments: pushing to L but improves at times with head corrected to midline as head tilt to L, sat with S briefly when stretching upright; sat EOB about 8 minutes Postural control: Left lateral lean Standing balance support: Bilateral upper extremity supported   Standing balance comment: Reliant on UE support and physical A for postural correction                            Cognition Arousal/Alertness: Awake/alert Behavior During Therapy: Flat affect Overall Cognitive Status: Impaired/Different from baseline Area of Impairment: Attention;Safety/judgement;Awareness;Problem solving                       Following Commands: Follows one step commands consistently Safety/Judgement: Decreased awareness of safety;Decreased awareness of deficits   Problem Solving: Slow processing;Decreased initiation;Requires verbal cues;Requires tactile cues;Difficulty sequencing        Exercises      General Comments General comments (skin integrity, edema, etc.): sitter in the room, posey donned and chair alarm set after pt on chair      Pertinent Vitals/Pain Pain Assessment: Faces  Faces Pain Scale: Hurts even more Pain Location: R shoulder Pain Descriptors / Indicators: Guarding Pain Intervention(s): Monitored during session;Repositioned;Other (comment) (applied "gel" (Voltaren) per pt request)    Home Living                      Prior Function            PT Goals (current goals can now be found in the care plan section) Progress towards PT goals: Progressing  toward goals    Frequency    Min 3X/week      PT Plan Current plan remains appropriate    Co-evaluation              AM-PAC PT "6 Clicks" Mobility   Outcome Measure  Help needed turning from your back to your side while in a flat bed without using bedrails?: A Lot Help needed moving from lying on your back to sitting on the side of a flat bed without using bedrails?: A Lot Help needed moving to and from a bed to a chair (including a wheelchair)?: Total Help needed standing up from a chair using your arms (e.g., wheelchair or bedside chair)?: Total Help needed to walk in hospital room?: Total Help needed climbing 3-5 steps with a railing? : Total 6 Click Score: 8    End of Session Equipment Utilized During Treatment: Gait belt Activity Tolerance: Patient tolerated treatment well Patient left: with call bell/phone within reach;with nursing/sitter in room;with chair alarm set;in chair Nurse Communication: Mobility status PT Visit Diagnosis: Hemiplegia and hemiparesis;Unsteadiness on feet (R26.81);Muscle weakness (generalized) (M62.81);Difficulty in walking, not elsewhere classified (R26.2);Other symptoms and signs involving the nervous system (R29.898) Hemiplegia - Right/Left: Left Hemiplegia - dominant/non-dominant: Dominant Hemiplegia - caused by: Nontraumatic intracerebral hemorrhage     Time: 1150-1210 PT Time Calculation (min) (ACUTE ONLY): 20 min  Charges:  $Therapeutic Activity: 8-22 mins                     Magda Kiel, PT Acute Rehabilitation Services [phone] Office:[phone] 03/10/2021    Reginia Naas 03/10/2021, 1:25 PM

## 2021-03-10 NOTE — Progress Notes (Signed)
Has been alert and oriented times 4.  Has been calm and cooperative and following commands.  Has been appropriate and not attempting to get out of the bed.  Restraints discontinued.

## 2021-03-10 NOTE — TOC Progression Note (Signed)
Transition of Care [facility]) - Progression Note    Patient Details  Name: Chad Coleman MRN: [id number] Date of Birth: [birth year]  Transition of Care [facility]) CM/SW Contact  Curlene Labrum, RN Phone Number: 03/10/2021, 8:26 AM  Clinical Narrative:    Case management spoke with Madilyn Fireman, MSW and consult placed with chaplain services to schedule time to meet with the patient's sister, [name], on Saturday, 03/19/2021 to complete [place] of Attorney paperwork at the hospital.  The patient's sister, [name], is in the process of moving the patient's belongings out of his boarding house in [place], [place] this weekend and hopes to have the patient placed in a SNF near [place], [place], where she lives so that she can assist the patient with needed care.  [name], the patient's sister, works full-time and is unable to provide 24-hour assistance for care.  The patient's sister plans to assist with the patient with Medicaid application, disability paperwork and SNf placement.  CM and MSW to follow the patient for [facility] placement near [place], [place].   Expected Discharge Plan: [place] Barriers to Discharge: Continued Medical Work up,Inadequate or no insurance,Active Substance Use - Placement  Expected Discharge Plan and Services Expected Discharge Plan: [place] In-house Referral: Clinical Social Work,Chaplain Discharge Planning Services: CM Consult Post Acute Care Choice: [place] Living arrangements for the past 2 months: [place] Determinants of Health (SDOH) Interventions    Readmission Risk Interventions No flowsheet data found.

## 2021-03-11 LAB — GLUCOSE, CAPILLARY
Glucose-Capillary: 129 mg/dL — ABNORMAL HIGH (ref 70–99)
Glucose-Capillary: 142 mg/dL — ABNORMAL HIGH (ref 70–99)
Glucose-Capillary: 177 mg/dL — ABNORMAL HIGH (ref 70–99)
Glucose-Capillary: 114 mg/dL — ABNORMAL HIGH (ref 70–99)

## 2021-03-11 LAB — MAGNESIUM: Magnesium: 1.9 mg/dL (ref 1.7–2.4)

## 2021-03-11 LAB — POTASSIUM: Potassium: 3.5 mmol/L (ref 3.5–5.1)

## 2021-03-11 MED ORDER — MAGNESIUM SULFATE IN D5W 1-5 GM/100ML-% IV SOLN
1.0000 g | Freq: Once | INTRAVENOUS | Status: AC
  Administered 2021-03-12: 1 g via INTRAVENOUS
  Filled 2021-03-11: qty 100

## 2021-03-11 MED ORDER — POTASSIUM CHLORIDE CRYS ER 20 MEQ PO TBCR
40.0000 meq | EXTENDED_RELEASE_TABLET | Freq: Once | ORAL | Status: AC
  Administered 2021-03-12: 40 meq via ORAL
  Filled 2021-03-11: qty 2

## 2021-03-11 MED ORDER — HYDRALAZINE HCL 25 MG PO TABS
25.0000 mg | ORAL_TABLET | Freq: Once | ORAL | Status: AC
  Administered 2021-03-11: 25 mg via ORAL
  Filled 2021-03-11: qty 1

## 2021-03-11 MED ORDER — HYDRALAZINE HCL 50 MG PO TABS
75.0000 mg | ORAL_TABLET | Freq: Three times a day (TID) | ORAL | Status: DC
  Administered 2021-03-11 – 2021-03-13 (×6): 75 mg via ORAL
  Filled 2021-03-11 (×6): qty 1

## 2021-03-11 NOTE — Progress Notes (Signed)
   03/11/21 1406  Clinical Encounter Type  Visited With Family;Patient  Visit Type Follow-up   Chaplain spoke with Pt's sister, [name], per Pt's request. Chaplain updated her that [facility] does not notarize ADs on weekends and are available 2pm-4pm for notarization on weekdays. Chaplain also informed her that she does not need to be present for the notarization. [name] appreciated this update and did not have any questions.    Chaplain then returned to Pt's room to finish living will education. Pt was coughing a lot during the visit and the nurse tech assisted him in sitting up. Pt did not have any questions at the moment. Chaplain told Pt that after he speaks with his sister and fills out the paperwork, his nurse can page the chaplain to have it notarized.  This note was prepared by Chaplain Resident, Dante Gang, MDiv. Chaplain remains available as needed through the on-call pager: [phone].

## 2021-03-11 NOTE — Progress Notes (Signed)
[facility] PROGRESS NOTE  Maicol Bowland WJX:[id number] DOB: 30-Oct-1961 DOA: 02/21/2021 PCP: Patient, No Pcp Per (Inactive)  Status: Remains inpatient appropriate because:Altered mental status, Unsafe d/c plan and Inpatient level of care appropriate due to severity of illness   Dispo: The patient is from: Home              Anticipated d/c is to: SNF              Patient currently is medically stable to d/c.               Barriers to DC: No insurance.  Medicaid has been filed and a letter was dictated to clarify patient's underlying medical status to help qualify for Medicaid and disability.  Sister and patient discussing making sister power of attorney to aid in future healthcare decisions   Difficult to place patient Yes  4/15: FMLA paperwork completed, scanned into our system and returned by email to patient sister as requested  Level of care: Med-Surg  Code Status: Full Family Communication: 4/13 [name] [phone]. Requested my work email so she could send FMLA forms for pt. She stated she was not his POA and to her knowledge he did not have a designated POA. I told her LCSW would contact her this week to discuss guardianship process DVT prophylaxis: SCDs Vaccination status: Unknown  Foley catheter: Condom catheter  HPI: 60 year old gentleman prior history of hypertension, cocaine use, tobacco abuse admitted on 02/21/2021 with a code stroke was found to have intracranial hemorrhage in the right basal ganglia secondary to hypertension and cocaine use.  Patient was transferred from neurology service to hospitalist service on 02/26/2021. Therapy evaluations recommending SNF.  pt had a fall on 03/03/21, he underwent CT head showing increased size of the intraparenchymal hematoma with worsening leftward midline shift, now measuring 8 mm.Effaced right lateral ventricle, which may become entrapped and predisposes to hydrocephalus. He was transferred to ICU overnight, neuro  surgery consulted and a repeat CT head ordered which was essentially stable .  Pt had another fall in the room on 03/05/21, repeat CT head without contrast showed Unchanged appearance of right basal ganglia intraparenchymal hematoma with 7 mm of leftward midline shift.  Subjective: Awake.  Lying in bed.  Eating salad with chicken for lunch.  Nods that right shoulder pain improved  Objective: Vitals:   03/10/21 2022 03/10/21 2335  BP: (!) 158/84 (!) 161/95  Pulse: 84 92  Resp: 16 17  Temp: 97.7 F (36.5 C) 98.6 F (37 C)  SpO2: 95% 100%    Intake/Output Summary (Last 24 hours) at 03/11/2021 0801 Last data filed at 03/10/2021 1748 Gross per 24 hour  Intake 536 ml  Output 650 ml  Net -114 ml   Filed Weights   02/25/21 1357  Weight: 73.2 kg    Exam:  Constitutional: Awake, calm, no acute distress.  Comfortable. Cardiovascular: Heart sounds are normal.  Pulses regular.  Normotensive. Abdomen:   LBM 4/14, normoactive bowel sounds.  Eating well Neurologic: CN 2-12 grossly intact. Sensation intact, DTR normal. Strength 5/5 on the right with a dense, insensate left hemiplegia involving both the upper and lower extremity. Psychiatric: Alert and oriented x3.  Pleasant affect.  Did poorly on recent SLUMS screening by speech therapy with a score of 15/30   Assessment/Plan: Acute problems: Intracranial hemorrhage with associated brain injury Echocardiogram showed left ventricle ejection fraction 65 to 70% with grade 1 diastolic dysfunction. LDL 52. Speech evaluation recommending dysphagia 2  diet with thin liquid. Due to impulsivity secondary to brain injury low-dose Zoloft and Depakote initiated with significant improvement in behaviors. Cognitive eval per OT/SLP has demonstrated deficits (see below) SLP COGNITIVE EVAL 4/8: Overall, his speech-language-cognitive function appears similar to assessment on 3/29 with exception of working memory. Severity of dysarthria was unchanged and is  mild-mod in conversation. Expressive and receptive language is functional. On SLUMS assessment he scored a 15 out of possible 30 requiring cues to retrieve 3/5 on word recall, difficulty with mental calculations, accurate with 1 of 4 questions for auditory recall of paragraph. ST will continue treatment to increase independence. OT COGNITIVE EVAL 4/8:  Arousal/Alertness: Awake/alert Behavior During Therapy: Flat affect Overall Cognitive Status: Impaired/Different from baseline Area of Impairment: Attention;Memory;Following commands;Safety/judgement;Awareness;Problem solving Current Attention Level: Sustained (short time) Memory: Decreased short-term memory;Decreased recall of precautions Following Commands: Follows one step commands consistently Safety/Judgement: Decreased awareness of safety;Decreased awareness of deficits Awareness: Intellectual Problem Solving: Slow processing;Decreased initiation;Requires verbal cues;Requires tactile cues;Difficulty sequencing General Comments: Pt impulsive and requiring simple, direct cues. Follow one step commands with calm environement. At end of session, providsing questioning cues to have pt review session, pt abel to report what he did and why with Mod questioning cues    Mechanical fall on 03/03/21 and 03/05/21. A ct head without contrast was obtained, showed  increased size of the intraparenchymal hematoma with worsening leftward midline shift, now measuring 8 mm.Effaced right lateral ventricle, which may become entrapped and predisposes to hydrocephalus. He was transferred to ICU overnight, neuro surgery consulted and a repeat CT head ordered which was essentially stable .  Pt transferred back to the floor.  Bed side sitter ordered for safety.   Right anterior shoulder pain -No crepitus on exam -Suspect mild muscle strain as this is extremity patient uses to reposition self in bed post stroke -Continue heat and Voltaren gel -Encouraged to also use  RLE in addition to RUE when repositioning  Physical Deconditioning PT eval 4/14:    Patient progressing some with sitting balance during session and with cervical ROM.  His upright orientation and  Proprioception are also affecting balance at EOB.  Patient not safe for ambulation at this time due to awareness, attention and L LE weakness.  Continue to feet he will benefit from skilled PT in the acute setting and follow up SNF level rehab at d/c.            OT eval 4/12:   Pt making steady progress towards OT goals this session. Pt continues to present with impaired balance, L hemiplegia, decreased activity tolerance and impaired awareness into deficits. Pt currently requires MOD- MAX A +2 for bed mobility and MIN guard - MAX A for static sitting balance with RUE positioned beside pt and posteriorly. Session with a focus on standing tolerance with pt needing MOD A +2 to power into standing from EOB with pt using arm rest on back of recliner to pull up on with RUE. Pt able to complete lateral weight shifts in standing to facilitate higher level functional mobility tasks. Pt would continue to benefit from skilled occupational therapy while admitted and after d/c to address the below listed limitations in order to improve overall functional mobility and facilitate independence with BADL participation. DC plan remains appropriate, will follow acutely per POC.    Hypertension Not well controlled.  Increase hydralazine 50 mg TID.  in addition to Norvasc 10 mg, clonidine 0.1 mg daily, isosorbide 15 mg daily, lisinopril 10 mg daily  Hyperlipidemia Continue with statin.   Type 2 diabetes mellitus uncontrolled with hyperglycemia Hemoglobin A1 c is 6.4. not on insuliin at home.  Current CBGs well-controlled over the past 24 hours with range of 91-180 Continue with Metformin. Continue with SSI.   Hyponatremia Much improved to 134.  Probably secondary to poor oral intake. Thiazide diuretic  has been discontinued. Serum osmolality was slightly low at 274 with low normal 275. 4/11 no indication to continue fluid restriction therefore this was discontinued.     Other problems: Hypokalemia Replaced, repeat level wnl.   Mild abdominal pain: abd film is negative for obstruction.   Mild elevation of liver enzymes.  [place] ABD ordered and is negative.    Data Reviewed: Basic Metabolic Panel: Recent Labs  Lab 03/06/21 1110  NA 134*  K 4.1  CL 100  CO2 22  GLUCOSE 114*  BUN 13  CREATININE 0.95  CALCIUM 9.1   Liver Function Tests: No results for input(s): AST, ALT, ALKPHOS, BILITOT, PROT, ALBUMIN in the last 168 hours. No results for input(s): LIPASE, AMYLASE in the last 168 hours. No results for input(s): AMMONIA in the last 168 hours. CBC: Recent Labs  Lab 03/06/21 1110  WBC 9.3  NEUTROABS 6.8  HGB 15.4  HCT 46.2  MCV 87.3  PLT 400   Cardiac Enzymes: No results for input(s): CKTOTAL, CKMB, CKMBINDEX, TROPONINI in the last 168 hours. BNP (last 3 results) No results for input(s): BNP in the last 8760 hours.  ProBNP (last 3 results) No results for input(s): PROBNP in the last 8760 hours.  CBG: Recent Labs  Lab 03/10/21 0558 03/10/21 1203 03/10/21 1654 03/10/21 2125 03/11/21 0626  GLUCAP 147* 146* 119* 127* 129*    No results found for this or any previous visit (from the past 240 hour(s)).   Studies: No results found.  Scheduled Meds: . amLODipine  10 mg Oral Daily  . atorvastatin  20 mg Oral Daily  . diclofenac Sodium  4 g Topical QID  . feeding supplement  1 Container Oral TID BM  . hydrALAZINE  25 mg Oral Once  . hydrALAZINE  75 mg Oral Q8H  . insulin aspart  0-5 Units Subcutaneous QHS  . insulin aspart  0-9 Units Subcutaneous TID WC  . isosorbide mononitrate  15 mg Oral Daily  . lisinopril  40 mg Oral Daily  . metFORMIN  500 mg Oral Q breakfast  . pantoprazole  40 mg Oral QHS  . polyethylene glycol  17 g Oral Daily  .  senna-docusate  1 tablet Oral BID  . sertraline  25 mg Oral Daily  . valproic acid  250 mg Oral BID   Continuous Infusions:  Active Problems:   Intraparenchymal hemorrhage of brain (HCC)   Hypertension   Tobacco abuse   Hyponatremia   Leukocytosis   Controlled type 2 diabetes mellitus with hyperglycemia, without long-term current use of insulin ([place])   Brain injury with brief loss of consciousness ([place])   Acute pain of right shoulder   Consultants:  Neurology  Neurosurgery  Procedures:  Echocardiogram  Antibiotics: Anti-infectives (From admission, onward)   None       Time spent: 35 minutes    Erin Hearing ANP  [facility] 7 am - 330 pm/M-F for direct patient care and secure chat Please refer to Amion for contact info 18  days

## 2021-03-11 NOTE — Progress Notes (Signed)
Occupational Therapy Treatment Patient Details Name: Chad Coleman MRN: [id number] DOB: [birth year] Today's Date: 03/11/2021    History of present illness 60 yo male presents to [facility] on 3/28 with acute onset of L weakness, numbness, and L facial droop. CTH R BG ICH, suspect hypertensive.  Fall on 02/28/21. imaging showing increased in R intraparenchymal hematoma with worsening leftward midline shift on 4/7. PMH includes hypertension, tobacco use, noncompliance with antihypertensive medications, and cocaine abuse.   OT comments  Worked with pt on facilitation of Lt UE.  He is able to activate triceps with mod facilitation and mod verbal cues for pt attention to the Lt UE, and to able to activate biceps to a lesser degree.  Worked on drinking from cup with Lt UE - requires max facilitation.  Pt frequently self distracts and requires mod - max cues to attend to Lt UE.  Goals updated.   Follow Up Recommendations  SNF    Equipment Recommendations  None recommended by OT    Recommendations for Other Services      Precautions / Restrictions Precautions Precautions: Fall Precaution Comments: Pusher, L neglect, impulsive       Mobility Bed Mobility Overal bed mobility: Needs Assistance             General bed mobility comments: Pt able to pull self into long sitting with min guard assist    Transfers                 General transfer comment: Pt deferred EOB activity due to fatigue and wanting to take a nap    Balance Overall balance assessment: Needs assistance   Sitting balance-Leahy Scale: Poor                                     ADL either performed or assessed with clinical judgement   ADL     Eating/Feeding Details (indicate cue type and reason): Worked on drinking from cup with Lt UE - requires max A and max cues to attend fully to the [employer]   Additional Comments: Rt gaze  preference   Perception     Praxis      Cognition Arousal/Alertness: Awake/alert Behavior During Therapy: Flat affect Overall Cognitive Status: Impaired/Different from baseline Area of Impairment: Attention;Memory;Following commands;Safety/judgement;Awareness;Problem solving                   Current Attention Level: Sustained Memory: Decreased short-term memory Following Commands: Follows one step commands consistently (with cues) Safety/Judgement: Decreased awareness of deficits;Decreased awareness of safety Awareness: Intellectual Problem Solving: Slow processing;Requires verbal cues;Requires tactile cues General Comments: Pt easily distracted, and frequently self distracts        Exercises     Shoulder Instructions       General Comments      Pertinent Vitals/ Pain       Pain Assessment: Faces Faces Pain Scale: Hurts little more Pain Location: R shoulder Pain Descriptors / Indicators: Grimacing Pain Intervention(s): Monitored during session  Home Living  Prior Functioning/Environment              Frequency  Min 2X/week        Progress Toward Goals  OT Goals(current goals can now be found in the care plan section)  Progress towards OT goals: Progressing toward goals  Acute Rehab OT Goals Patient Stated Goal: to take a nap OT Goal Formulation: With patient Time For Goal Achievement: 03/25/21 Potential to Achieve Goals: Good ADL Goals Pt Will Perform Grooming: with min assist;sitting Pt Will Perform Upper Body Bathing: with min assist;sitting Pt Will Transfer to Toilet: with min assist;with +2 assist;bedside commode;stand pivot transfer Additional ADL Goal #1: Pt will perform bed mobility with Min A in preparation for ADLs Additional ADL Goal #2: Pt will tolerate sitting at EOB for 10 minutes with Min A in preparation for ADLs Additional ADL Goal #3: Pt will use Lt UE as a  stabilizer with mod A and mod verbal cues  Plan Frequency remains appropriate;Discharge plan needs to be updated    Co-evaluation                 AM-PAC OT "6 Clicks" Daily Activity     Outcome Measure   Help from another person eating meals?: A Lot Help from another person taking care of personal grooming?: A Lot Help from another person toileting, which includes using toliet, bedpan, or urinal?: A Lot Help from another person bathing (including washing, rinsing, drying)?: A Lot Help from another person to put on and taking off regular upper body clothing?: A Lot Help from another person to put on and taking off regular lower body clothing?: Total 6 Click Score: 11    End of Session    OT Visit Diagnosis: Unsteadiness on feet (R26.81);Other abnormalities of gait and mobility (R26.89);Muscle weakness (generalized) (M62.81);Hemiplegia and hemiparesis Hemiplegia - Right/Left: Left Hemiplegia - dominant/non-dominant: Dominant Hemiplegia - caused by: Cerebral infarction   Activity Tolerance Patient limited by fatigue   Patient Left in bed;with call bell/phone within reach;with bed alarm set   Nurse Communication Mobility status        Time: 1157-2620 OT Time Calculation (min): 20 min  Charges: OT General Charges $OT Visit: 1 Visit OT Treatments $Neuromuscular Re-education: 8-22 mins  Nilsa Nutting OTR/L Acute Rehabilitation Services Pager [phone] Office [phone]    Lucille Passy M 03/11/2021, 3:52 PM

## 2021-03-11 NOTE — Progress Notes (Signed)
   03/11/21 1112  Clinical Encounter Type  Visited With Patient  Visit Type Initial;Other (Comment) (HCPOA)  Referral From Social work  Consult/Referral To [employer] responded to consult for [facility]. Chaplain introduced the Advance Directive process with the Pt. Chaplain also spoke with Pt's nurse, Davy Pique, about Pt's bed pan concern. Pt stated that his phone wasn't working so he was unable to call his nurse himself. Chaplain had responded to a call, but will return later this afternoon.  This note was prepared by Chaplain Resident, Dante Gang, MDiv. Chaplain remains available as needed through the on-call pager: [phone].

## 2021-03-11 NOTE — Plan of Care (Signed)
  Problem: Coping: Goal: Will verbalize positive feelings about self Outcome: Progressing Goal: Will identify appropriate support needs Outcome: Progressing   Problem: Health Behavior/Discharge Planning: Goal: Ability to manage health-related needs will improve Outcome: Progressing   Problem: Self-Care: Goal: Ability to participate in self-care as condition permits will improve Outcome: Progressing Goal: Verbalization of feelings and concerns over difficulty with self-care will improve Outcome: Progressing Goal: Ability to communicate needs accurately will improve Outcome: Progressing   Problem: Nutrition: Goal: Risk of aspiration will decrease Outcome: Progressing Goal: Dietary intake will improve Outcome: Progressing   Problem: Intracerebral Hemorrhage Tissue Perfusion: Goal: Complications of Intracerebral Hemorrhage will be minimized Outcome: Progressing   Problem: Safety: Goal: Non-violent Restraint(s) Outcome: Progressing   Problem: Education: Goal: Knowledge of General Education information will improve Description: Including pain rating scale, medication(s)/side effects and non-pharmacologic comfort measures Outcome: Progressing   Problem: Health Behavior/Discharge Planning: Goal: Ability to manage health-related needs will improve Outcome: Progressing   Problem: Clinical Measurements: Goal: Ability to maintain clinical measurements within normal limits will improve Outcome: Progressing Goal: Will remain free from infection Outcome: Progressing Goal: Diagnostic test results will improve Outcome: Progressing Goal: Respiratory complications will improve Outcome: Progressing Goal: Cardiovascular complication will be avoided Outcome: Progressing   Problem: Activity: Goal: Risk for activity intolerance will decrease Outcome: Progressing   Problem: Nutrition: Goal: Adequate nutrition will be maintained Outcome: Progressing   Problem: Coping: Goal: Level  of anxiety will decrease Outcome: Progressing   Problem: Elimination: Goal: Will not experience complications related to bowel motility Outcome: Progressing Goal: Will not experience complications related to urinary retention Outcome: Progressing   Problem: Pain Managment: Goal: General experience of comfort will improve Outcome: Progressing   Problem: Safety: Goal: Ability to remain free from injury will improve Outcome: Progressing   Problem: Skin Integrity: Goal: Risk for impaired skin integrity will decrease Outcome: Progressing   Problem: Education: Goal: Knowledge of disease or condition will improve Outcome: Progressing Goal: Knowledge of secondary prevention will improve Outcome: Progressing Goal: Knowledge of patient specific risk factors addressed and post discharge goals established will improve Outcome: Progressing Goal: Individualized Educational Video(s) Outcome: Progressing   Problem: Safety: Goal: Non-violent Restraint(s) Outcome: Progressing

## 2021-03-11 NOTE — TOC Progression Note (Addendum)
Transition of Care [facility]) - Progression Note    Patient Details  Name: Chad Coleman MRN: [id number] Date of Birth: [birth year]  Transition of Care [facility]) CM/SW [place], RN Phone Number: 03/11/2021, 9:43 AM  Clinical Narrative:    Case management and MSW with DTP is exploring [facility] facilities in [place], [place] area for admission close to the patient's sister's home.  The patient's sister, [name], works during the day and is unable to provide 24 hour supervision for the patient.  [place], [place] - faxed out in the hub with comment [facility] - left message with Shela Commons, CM of Admissions [place] - left message with Lelon Mast, CM of admissions [place] - left message with Shela Commons, CM with admissions [employer] - placed in [employer] - placed in the hub  CM and MSW will continue to follow for transitions of care needs.  Expected Discharge Plan: [place] Barriers to Discharge: Continued Medical Work up,Inadequate or no insurance,Active Substance Use - Placement  Expected Discharge Plan and Services Expected Discharge Plan: [place] In-house Referral: Clinical Social Work,Chaplain Discharge Planning Services: CM Consult Post Acute Care Choice: [place] Living arrangements for the past 2 months: [place] Determinants of Health (SDOH) Interventions    Readmission Risk Interventions No flowsheet data found.

## 2021-03-11 NOTE — Progress Notes (Signed)
Progress Note    Chad Coleman   WLN:[id number]  DOB: January 23, 1961  DOA: 02/21/2021     18  PCP: Patient, No Pcp Per (Inactive)  CC: left weakness, numbness, left facial droop  Hospital Course: [name] is a 60 year old male with PMH hypertension, cocaine use, tobacco abuse admitted on 02/21/2021 with a code stroke was found to have intracranial hemorrhage in the right basal ganglia secondary to hypertension and cocaine use. Patient was transferred from neurology service to hospitalist service on 02/26/2021. Therapy evaluations recommending SNF. He had a fall on 03/03/21, he underwentCT head showing increased size of the intraparenchymal hematoma with worsening leftward midline shift, measuring 8 mm, effaced right lateral ventricle. He was transferred to [place], neurosurgery consulted and a repeat CT head was ordered which was essentially stable . Pt had another fall in the room on 03/05/21, repeat CT head without contrast showedunchanged appearance of right basal ganglia intraparenchymal hematoma with 7 mm of leftward midline shift. He was ordered to have a Air cabin crew and waist belt for ongoing safety during hospitalization.  He exhibited some impulsive behavior and was redirected as needed.  Interval History:  No events overnight.  Resting in bed comfortably this morning. Usually asleep but arouses easily. Had no questions for me this morning.   ROS: Constitutional: negative for chills and fevers, Respiratory: negative for cough, Cardiovascular: negative for chest pain and Gastrointestinal: negative for abdominal pain  Assessment & Plan:  Hypertension - starting to improve with regimen adjustment - now off clonidine, no significant rebound; next will plan to d/c imdur tomorrow after having increased hydralazine further today - continue lisinopril, amlodipine, hydralazine  Intracranial hemorrhage with associated brain injury Mechanical falls, 4/7/22and 03/05/21. - Goal SBP <140/90   - Echo showed left EF 65 - 70%, Gr 1 DD - LDL is 52 - Speech evaluation recommending dysphagia 2 dietwith thin liquid. - impulsive and multiple falls; continue restraints and sitter as needed - continue Zoloft and Depakote (d/c tramadol as this can lower seizure threshold)  Right anterior shoulder pain -No crepitus on exam -Suspect mild muscle strain as this is extremity patient uses to reposition self in bed post stroke -Continue heat and Voltaren gel  Physical Deconditioning - awaiting SNF  Hyperlipidemia Continue with statin.  Type 2 diabetes mellitus uncontrolled with hyperglycemia Hemoglobin A1 c is 6.4% Continue with Metformin. Continue with SSI.   Hyponatremia Likely secondary to poor oral intake. Thiazide diuretic has been discontinued.  Hypokalemia Replaced  Mild abdominal pain abd film is negative for obstruction  Mild elevation of liver enzymes [place] ABD ordered and is negative.   Old records reviewed in assessment of this patient  Antimicrobials: n/a  DVT prophylaxis: Place and maintain sequential compression device Start: 03/07/21 0801   Code Status:   Code Status: Full Code Family Communication:   Disposition Plan: Status is: Inpatient  Remains inpatient appropriate because:Inpatient level of care appropriate due to severity of illness   Dispo: The patient is from: Home              Anticipated d/c is to: SNF              Patient currently is medically stable to d/c.   Difficult to place patient Yes  Risk of unplanned readmission score: Unplanned Admission- Pilot do not use: 17.09   Objective: Blood pressure (!) 145/85, pulse 96, temperature 99 F (37.2 C), temperature source Oral, resp. rate 16, height 5\' 9"  (1.753 m), weight 73.2 kg, SpO2  100 %.  Examination: General appearance: alert, cooperative and no distress Head: Normocephalic, without obvious abnormality, atraumatic Eyes: EOMI Lungs: clear to auscultation  bilaterally Heart: regular rate and rhythm and S1, S2 normal Abdomen: normal findings: bowel sounds normal and soft, non-tender Extremities: no edema Skin: mobility and turgor normal Neurologic: Left-sided hemiplegia.  Sensation intact.  Follows commands.  Consultants:   Neurosurgery -signed off  Procedures:     Data Reviewed: I have personally reviewed following labs and imaging studies Results for orders placed or performed during the hospital encounter of 02/21/21 (from the past 24 hour(s))  Glucose, capillary     Status: Abnormal   Collection Time: 03/10/21  4:54 PM  Result Value Ref Range   Glucose-Capillary 119 (H) 70 - 99 mg/dL   Comment 1 Notify RN    Comment 2 Document in Chart   Glucose, capillary     Status: Abnormal   Collection Time: 03/10/21  9:25 PM  Result Value Ref Range   Glucose-Capillary 127 (H) 70 - 99 mg/dL   Comment 1 Notify RN    Comment 2 Document in Chart   Glucose, capillary     Status: Abnormal   Collection Time: 03/11/21  6:26 AM  Result Value Ref Range   Glucose-Capillary 129 (H) 70 - 99 mg/dL   Comment 1 Notify RN    Comment 2 Document in Chart     No results found for this or any previous visit (from the past 240 hour(s)).   Radiology Studies: No results found. CT HEAD WO CONTRAST  Final Result    CT HEAD WO CONTRAST  Final Result    CT HEAD WO CONTRAST  Final Result    US Abdomen Complete  Final Result    DG Abd 2 Views  Final Result    MR BRAIN W WO CONTRAST  Final Result    CT HEAD WO CONTRAST  Final Result    CT ANGIO NECK CODE STROKE  Final Result  Addendum 1 of 1  ADDENDUM REPORT: 02/21/2021 18:37    ADDENDUM:  These results were called by telephone at the time of interpretation  on 02/21/2021 at 6:35 pm to provider [facility] , who verbally  acknowledged these results.    Additionally, given the presence of retained metallic fragments  within the right neck, a contrast-enhanced neck CT (rather than an   MRI) should be performed for further evaluation of the described  left neck mass. This was also discussed with Dr. Donnetta Simpers  at 6:35 p.m. on 02/21/2021.      Electronically Signed    By: Kellie Simmering DO    On: 02/21/2021 18:37      Final    CT ANGIO HEAD CODE STROKE  Final Result  Addendum 1 of 1  ADDENDUM REPORT: 02/21/2021 18:37    ADDENDUM:  These results were called by telephone at the time of interpretation  on 02/21/2021 at 6:35 pm to provider [facility] , who verbally  acknowledged these results.    Additionally, given the presence of retained metallic fragments  within the right neck, a contrast-enhanced neck CT (rather than an  MRI) should be performed for further evaluation of the described  left neck mass. This was also discussed with Dr. Donnetta Simpers  at 6:35 p.m. on 02/21/2021.      Electronically Signed    By: Kellie Simmering DO    On: 02/21/2021 18:37      Final  CT HEAD CODE STROKE WO CONTRAST  Final Result    DG Swallowing Func-Speech Pathology    (Results Pending)    Scheduled Meds: . amLODipine  10 mg Oral Daily  . atorvastatin  20 mg Oral Daily  . diclofenac Sodium  4 g Topical QID  . feeding supplement  1 Container Oral TID BM  . hydrALAZINE  75 mg Oral Q8H  . insulin aspart  0-5 Units Subcutaneous QHS  . insulin aspart  0-9 Units Subcutaneous TID WC  . isosorbide mononitrate  15 mg Oral Daily  . lisinopril  40 mg Oral Daily  . metFORMIN  500 mg Oral Q breakfast  . pantoprazole  40 mg Oral QHS  . polyethylene glycol  17 g Oral Daily  . senna-docusate  1 tablet Oral BID  . sertraline  25 mg Oral Daily  . valproic acid  250 mg Oral BID   PRN Meds: acetaminophen **OR** acetaminophen (TYLENOL) oral liquid 160 mg/5 mL **OR** acetaminophen, alum & mag hydroxide-simeth, hydroxypropyl methylcellulose / hypromellose, ondansetron (ZOFRAN) IV Continuous Infusions:   LOS: 18 days    Dwyane Dee, MD [facility] 03/11/2021, 1:15 PM

## 2021-03-12 LAB — GLUCOSE, CAPILLARY
Glucose-Capillary: 113 mg/dL — ABNORMAL HIGH (ref 70–99)
Glucose-Capillary: 131 mg/dL — ABNORMAL HIGH (ref 70–99)
Glucose-Capillary: 112 mg/dL — ABNORMAL HIGH (ref 70–99)
Glucose-Capillary: 205 mg/dL — ABNORMAL HIGH (ref 70–99)

## 2021-03-12 MED ORDER — ADULT MULTIVITAMIN W/MINERALS CH
1.0000 | ORAL_TABLET | Freq: Every day | ORAL | Status: DC
  Administered 2021-03-12 – 2021-04-13 (×32): 1 via ORAL
  Filled 2021-03-12 (×33): qty 1

## 2021-03-12 MED ORDER — LABETALOL HCL 5 MG/ML IV SOLN: 10.0000 mg | INTRAVENOUS | Status: DC | PRN

## 2021-03-12 MED ORDER — HYDRALAZINE HCL 25 MG PO TABS
25.0000 mg | ORAL_TABLET | ORAL | Status: DC | PRN
  Filled 2021-03-12: qty 1

## 2021-03-12 NOTE — Progress Notes (Signed)
Progress Note    Chad Coleman   WFU:[id number]  DOB: 03/09/1961  DOA: 02/21/2021     19  PCP: Patient, No Pcp Per (Inactive)  CC: left weakness, numbness, left facial droop  Hospital Course: Chad Coleman is a 60 year old male with PMH hypertension, cocaine use, tobacco abuse admitted on 02/21/2021 with a code stroke was found to have intracranial hemorrhage in the right basal ganglia secondary to hypertension and cocaine use. Patient was transferred from neurology service to hospitalist service on 02/26/2021. Therapy evaluations recommending SNF. He had a fall on 03/03/21, he underwentCT head showing increased size of the intraparenchymal hematoma with worsening leftward midline shift, measuring 8 mm, effaced right lateral ventricle. He was transferred to [place], neurosurgery consulted and a repeat CT head was ordered which was essentially stable . Pt had another fall in the room on 03/05/21, repeat CT head without contrast showedunchanged appearance of right basal ganglia intraparenchymal hematoma with 7 mm of leftward midline shift. He was ordered to have a Air cabin crew and waist belt for ongoing safety during hospitalization.  He exhibited some impulsive behavior and was redirected as needed.  Interval History:  Some NSVT overnight, patient asymptomatic. Electrolyte replacement ordered. He's left handed and has been trying to use his right hand to learn how to eat etc; otherwise he was doing okay when seen this am.   ROS: Constitutional: negative for chills and fevers, Respiratory: negative for cough, Cardiovascular: negative for chest pain and Gastrointestinal: negative for abdominal pain  Assessment & Plan:  Hypertension - starting to improve with regimen adjustment - d/c imdur and watch BP response and will adjust regimen further as needed - continue lisinopril, amlodipine, hydralazine  Intracranial hemorrhage with associated brain injury Mechanical falls, 4/7/22and 03/05/21. -  Goal SBP <140/90  - Echo showed left EF 65 - 70%, Gr 1 DD - LDL is 52 - Speech evaluation recommending dysphagia 2 dietwith thin liquid. - impulsive and multiple falls; continue restraints as needed - continue Zoloft and Depakote (d/c tramadol as this can lower seizure threshold)  NSVT - d/c tele - start MVI - s/p electrolyte replacement  Right anterior shoulder pain -No crepitus on exam -Suspect mild muscle strain as this is extremity patient uses to reposition self in bed post stroke -Continue heat and Voltaren gel  Physical Deconditioning - awaiting SNF  Hyperlipidemia Continue with statin.  Type 2 diabetes mellitus uncontrolled with hyperglycemia Hemoglobin A1 c is 6.4% Continue with Metformin. Continue with SSI.   Hyponatremia Likely secondary to poor oral intake. Thiazide diuretic has been discontinued.  Hypokalemia Replaced  Mild abdominal pain abd film is negative for obstruction  Mild elevation of liver enzymes [place] ABD ordered and is negative.   Old records reviewed in assessment of this patient  Antimicrobials: n/a  DVT prophylaxis: Place and maintain sequential compression device Start: 03/07/21 0801   Code Status:   Code Status: Full Code Family Communication:   Disposition Plan: Status is: Inpatient  Remains inpatient appropriate because:Inpatient level of care appropriate due to severity of illness   Dispo: The patient is from: Home              Anticipated d/c is to: SNF              Patient currently is medically stable to d/c.   Difficult to place patient Yes  Risk of unplanned readmission score: Unplanned Admission- Pilot do not use: 17.16   Objective: Blood pressure (!) 145/88, pulse 85, temperature 98 F (  36.7 C), temperature source Oral, resp. rate 16, height 5\' 9"  (1.753 m), weight 73.2 kg, SpO2 99 %.  Examination: General appearance: alert, cooperative and no distress Head: Normocephalic, without obvious abnormality,  atraumatic Eyes: EOMI Lungs: clear to auscultation bilaterally Heart: regular rate and rhythm and S1, S2 normal Abdomen: normal findings: bowel sounds normal and soft, non-tender Extremities: no edema Skin: mobility and turgor normal Neurologic: Left-sided hemiplegia.  Sensation intact.  Follows commands.  Consultants:   Neurosurgery -signed off  Procedures:     Data Reviewed: I have personally reviewed following labs and imaging studies Results for orders placed or performed during the hospital encounter of 02/21/21 (from the past 24 hour(s))  Glucose, capillary     Status: Abnormal   Collection Time: 03/11/21  1:30 PM  Result Value Ref Range   Glucose-Capillary 142 (H) 70 - 99 mg/dL  Glucose, capillary     Status: Abnormal   Collection Time: 03/11/21  6:18 PM  Result Value Ref Range   Glucose-Capillary 177 (H) 70 - 99 mg/dL  Glucose, capillary     Status: Abnormal   Collection Time: 03/11/21  9:01 PM  Result Value Ref Range   Glucose-Capillary 114 (H) 70 - 99 mg/dL   Comment 1 Notify RN    Comment 2 Document in Chart   Potassium     Status: None   Collection Time: 03/11/21 10:36 PM  Result Value Ref Range   Potassium 3.5 3.5 - 5.1 mmol/L  Magnesium     Status: None   Collection Time: 03/11/21 10:36 PM  Result Value Ref Range   Magnesium 1.9 1.7 - 2.4 mg/dL  Glucose, capillary     Status: Abnormal   Collection Time: 03/12/21  6:24 AM  Result Value Ref Range   Glucose-Capillary 113 (H) 70 - 99 mg/dL   Comment 1 Notify RN    Comment 2 Document in Chart   Glucose, capillary     Status: Abnormal   Collection Time: 03/12/21 12:31 PM  Result Value Ref Range   Glucose-Capillary 131 (H) 70 - 99 mg/dL    No results found for this or any previous visit (from the past 240 hour(s)).   Radiology Studies: No results found. CT HEAD WO CONTRAST  Final Result    CT HEAD WO CONTRAST  Final Result    CT HEAD WO CONTRAST  Final Result    US Abdomen Complete  Final  Result    DG Abd 2 Views  Final Result    MR BRAIN W WO CONTRAST  Final Result    CT HEAD WO CONTRAST  Final Result    CT ANGIO NECK CODE STROKE  Final Result  Addendum 1 of 1  ADDENDUM REPORT: 02/21/2021 18:37    ADDENDUM:  These results were called by telephone at the time of interpretation  on 02/21/2021 at 6:35 pm to provider [facility] , who verbally  acknowledged these results.    Additionally, given the presence of retained metallic fragments  within the right neck, a contrast-enhanced neck CT (rather than an  MRI) should be performed for further evaluation of the described  left neck mass. This was also discussed with Dr. Donnetta Simpers  at 6:35 p.m. on 02/21/2021.      Electronically Signed    By: Kellie Simmering DO    On: 02/21/2021 18:37      Final    CT ANGIO HEAD CODE STROKE  Final Result  Addendum 1 of 1  ADDENDUM REPORT: 02/21/2021 18:37    ADDENDUM:  These results were called by telephone at the time of interpretation  on 02/21/2021 at 6:35 pm to provider [facility] , who verbally  acknowledged these results.    Additionally, given the presence of retained metallic fragments  within the right neck, a contrast-enhanced neck CT (rather than an  MRI) should be performed for further evaluation of the described  left neck mass. This was also discussed with Dr. Donnetta Simpers  at 6:35 p.m. on 02/21/2021.      Electronically Signed    By: Kellie Simmering DO    On: 02/21/2021 18:37      Final    CT HEAD CODE STROKE WO CONTRAST  Final Result    DG Swallowing Func-Speech Pathology    (Results Pending)    Scheduled Meds: . amLODipine  10 mg Oral Daily  . atorvastatin  20 mg Oral Daily  . diclofenac Sodium  4 g Topical QID  . feeding supplement  1 Container Oral TID BM  . hydrALAZINE  75 mg Oral Q8H  . insulin aspart  0-5 Units Subcutaneous QHS  . insulin aspart  0-9 Units Subcutaneous TID WC  . lisinopril  40 mg Oral Daily  .  metFORMIN  500 mg Oral Q breakfast  . multivitamin with minerals  1 tablet Oral Daily  . pantoprazole  40 mg Oral QHS  . polyethylene glycol  17 g Oral Daily  . senna-docusate  1 tablet Oral BID  . sertraline  25 mg Oral Daily  . valproic acid  250 mg Oral BID   PRN Meds: acetaminophen **OR** acetaminophen (TYLENOL) oral liquid 160 mg/5 mL **OR** acetaminophen, alum & mag hydroxide-simeth, hydrALAZINE, hydroxypropyl methylcellulose / hypromellose, labetalol, ondansetron (ZOFRAN) IV Continuous Infusions:   LOS: 19 days    Dwyane Dee, MD [facility] 03/12/2021, 12:53 PM

## 2021-03-12 NOTE — Progress Notes (Signed)
Telemetry called to report pt having 7beats run of V-tach. Pt asymptomatic in bed sleeping with call light within reach. MD on-called paged and notified. New orders received. Will continue to closely monitor pt. Delia Heady RN

## 2021-03-12 NOTE — Progress Notes (Signed)
Telemetry called to report patient had a 14 beat run of V-tach. Pt asymptomatic in bed sleeping. Dr. Sabino Gasser paged. Will continue to monitor.

## 2021-03-13 LAB — GLUCOSE, CAPILLARY
Glucose-Capillary: 115 mg/dL — ABNORMAL HIGH (ref 70–99)
Glucose-Capillary: 109 mg/dL — ABNORMAL HIGH (ref 70–99)
Glucose-Capillary: 107 mg/dL — ABNORMAL HIGH (ref 70–99)
Glucose-Capillary: 183 mg/dL — ABNORMAL HIGH (ref 70–99)

## 2021-03-13 MED ORDER — HYDRALAZINE HCL 25 MG PO TABS
25.0000 mg | ORAL_TABLET | Freq: Once | ORAL | Status: AC
  Administered 2021-03-13: 25 mg via ORAL
  Filled 2021-03-13: qty 1

## 2021-03-13 MED ORDER — HYDRALAZINE HCL 50 MG PO TABS
100.0000 mg | ORAL_TABLET | Freq: Three times a day (TID) | ORAL | Status: DC
  Administered 2021-03-13 – 2021-04-03 (×59): 100 mg via ORAL
  Filled 2021-03-13 (×61): qty 2

## 2021-03-13 NOTE — Progress Notes (Signed)
Progress Note    Chad Coleman   [id number]  DOB: August 07, 1961  DOA: 02/21/2021     20  PCP: Chad Coleman, No Pcp Per (Inactive)  CC: left weakness, numbness, left facial droop  Hospital Course: [name] is a 60 year old male with PMH hypertension, cocaine use, tobacco abuse admitted on 02/21/2021 with a code stroke was found to have intracranial hemorrhage in the right basal ganglia secondary to hypertension and cocaine use. Chad Coleman was transferred from neurology service to hospitalist service on 02/26/2021. Therapy evaluations recommending SNF. He had a fall on 03/03/21, he underwentCT head showing increased size of the intraparenchymal hematoma with worsening leftward midline shift, measuring 8 mm, effaced right lateral ventricle. He was transferred to [place], neurosurgery consulted and a repeat CT head was ordered which was essentially stable . Pt had another fall in the room on 03/05/21, repeat CT head without contrast showedunchanged appearance of right basal ganglia intraparenchymal hematoma with 7 mm of leftward midline shift. He was ordered to have a Air cabin crew and waist belt for ongoing safety during hospitalization.  He exhibited some impulsive behavior and was redirected as needed.  Interval History:  Doing well this morning.  No events overnight.  Resting in bed comfortably when seen.  Denies any chest pain, palpitations, shortness of breath.  ROS: Constitutional: negative for chills and fevers, Respiratory: negative for cough, Cardiovascular: negative for chest pain and Gastrointestinal: negative for abdominal pain  Assessment & Plan:  Hypertension - starting to improve with regimen adjustment - d/c imdur and watch BP response and will adjust regimen further as needed - continue lisinopril, amlodipine, hydralazine -Increased hydralazine further today since Imdur has been stopped.  If blood pressure still starts to elevate further, will probably need to reinitiate Imdur  versus a diuretic at that point  Intracranial hemorrhage with associated brain injury Mechanical falls, 4/7/22and 03/05/21. - Goal SBP <140/90  - Echo showed left EF 65 - 70%, Gr 1 DD - LDL is 52 - Speech evaluation recommending dysphagia 2 dietwith thin liquid. - impulsive and multiple falls; continue restraints as needed - continue Zoloft and Depakote (d/c tramadol as this can lower seizure threshold)  NSVT - d/c tele - start MVI - s/p electrolyte replacement  Right anterior shoulder pain -No crepitus on exam -Suspect mild muscle strain as this is extremity Chad Coleman uses to reposition self in bed post stroke -Continue heat and Voltaren gel  Physical Deconditioning - awaiting SNF  Hyperlipidemia Continue with statin.  Type 2 diabetes mellitus uncontrolled with hyperglycemia Hemoglobin A1 c is 6.4% Continue with Metformin. Continue with SSI.   Hyponatremia Likely secondary to poor oral intake. Thiazide diuretic has been discontinued.  Hypokalemia Replaced  Mild abdominal pain abd film is negative for obstruction  Mild elevation of liver enzymes [place] ABD ordered and is negative.   Old records reviewed in assessment of this Chad Coleman  Antimicrobials: n/a  DVT prophylaxis: Place and maintain sequential compression device Start: 03/07/21 0801   Code Status:   Code Status: Full Code Family Communication:   Disposition Plan: Status is: Inpatient  Remains inpatient appropriate because:Inpatient level of care appropriate due to severity of illness   Dispo: The Chad Coleman is from: Home              Anticipated d/c is to: SNF              Chad Coleman currently is medically stable to d/c.   Difficult to place Chad Coleman Yes  Risk of unplanned readmission score: Unplanned  Admission- Pilot do not use: 17.55   Objective: Blood pressure (!) 159/87, pulse 78, temperature 98.3 F (36.8 C), temperature source Oral, resp. rate 17, height 5\' 9"  (1.753 m), weight 73.2 kg,  SpO2 100 %.  Examination: General appearance: alert, cooperative and no distress Head: Normocephalic, without obvious abnormality, atraumatic Eyes: EOMI Lungs: clear to auscultation bilaterally Heart: regular rate and rhythm and S1, S2 normal Abdomen: normal findings: bowel sounds normal and soft, non-tender Extremities: no edema Skin: mobility and turgor normal Neurologic: Left-sided hemiplegia.  Sensation intact.  Follows commands.  Consultants:   Neurosurgery -signed off  Procedures:     Data Reviewed: I have personally reviewed following labs and imaging studies Results for orders placed or performed during the hospital encounter of 02/21/21 (from the past 24 hour(s))  Glucose, capillary     Status: Abnormal   Collection Time: 03/12/21 12:31 PM  Result Value Ref Range   Glucose-Capillary 131 (H) 70 - 99 mg/dL  Glucose, capillary     Status: Abnormal   Collection Time: 03/12/21  5:26 PM  Result Value Ref Range   Glucose-Capillary 112 (H) 70 - 99 mg/dL  Glucose, capillary     Status: Abnormal   Collection Time: 03/12/21  9:51 PM  Result Value Ref Range   Glucose-Capillary 205 (H) 70 - 99 mg/dL   Comment 1 Notify RN    Comment 2 Document in Chart   Glucose, capillary     Status: Abnormal   Collection Time: 03/13/21  5:54 AM  Result Value Ref Range   Glucose-Capillary 115 (H) 70 - 99 mg/dL   Comment 1 Notify RN    Comment 2 Document in Chart     No results found for this or any previous visit (from the past 240 hour(s)).   Radiology Studies: No results found. CT HEAD WO CONTRAST  Final Result    CT HEAD WO CONTRAST  Final Result    CT HEAD WO CONTRAST  Final Result    US Abdomen Complete  Final Result    DG Abd 2 Views  Final Result    MR BRAIN W WO CONTRAST  Final Result    CT HEAD WO CONTRAST  Final Result    CT ANGIO NECK CODE STROKE  Final Result  Addendum 1 of 1  ADDENDUM REPORT: 02/21/2021 18:37    ADDENDUM:  These results were called by  telephone at the time of interpretation  on 02/21/2021 at 6:35 pm to provider [facility] , who verbally  acknowledged these results.    Additionally, given the presence of retained metallic fragments  within the right neck, a contrast-enhanced neck CT (rather than an  MRI) should be performed for further evaluation of the described  left neck mass. This was also discussed with Dr. Donnetta Simpers  at 6:35 p.m. on 02/21/2021.      Electronically Signed    By: Kellie Simmering DO    On: 02/21/2021 18:37      Final    CT ANGIO HEAD CODE STROKE  Final Result  Addendum 1 of 1  ADDENDUM REPORT: 02/21/2021 18:37    ADDENDUM:  These results were called by telephone at the time of interpretation  on 02/21/2021 at 6:35 pm to provider [facility] , who verbally  acknowledged these results.    Additionally, given the presence of retained metallic fragments  within the right neck, a contrast-enhanced neck CT (rather than an  MRI) should be performed for further evaluation of  the described  left neck mass. This was also discussed with Dr. Donnetta Simpers  at 6:35 p.m. on 02/21/2021.      Electronically Signed    By: Kellie Simmering DO    On: 02/21/2021 18:37      Final    CT HEAD CODE STROKE WO CONTRAST  Final Result    DG Swallowing Func-Speech Pathology    (Results Pending)    Scheduled Meds: . amLODipine  10 mg Oral Daily  . atorvastatin  20 mg Oral Daily  . diclofenac Sodium  4 g Topical QID  . feeding supplement  1 Container Oral TID BM  . hydrALAZINE  100 mg Oral Q8H  . insulin aspart  0-5 Units Subcutaneous QHS  . insulin aspart  0-9 Units Subcutaneous TID WC  . lisinopril  40 mg Oral Daily  . metFORMIN  500 mg Oral Q breakfast  . multivitamin with minerals  1 tablet Oral Daily  . pantoprazole  40 mg Oral QHS  . polyethylene glycol  17 g Oral Daily  . senna-docusate  1 tablet Oral BID  . sertraline  25 mg Oral Daily  . valproic acid  250 mg Oral BID    PRN Meds: acetaminophen **OR** acetaminophen (TYLENOL) oral liquid 160 mg/5 mL **OR** acetaminophen, alum & mag hydroxide-simeth, hydrALAZINE, hydroxypropyl methylcellulose / hypromellose, labetalol, ondansetron (ZOFRAN) IV Continuous Infusions:   LOS: 20 days    Dwyane Dee, MD [facility] 03/13/2021, 11:28 AM

## 2021-03-14 LAB — GLUCOSE, CAPILLARY
Glucose-Capillary: 105 mg/dL — ABNORMAL HIGH (ref 70–99)
Glucose-Capillary: 158 mg/dL — ABNORMAL HIGH (ref 70–99)
Glucose-Capillary: 124 mg/dL — ABNORMAL HIGH (ref 70–99)
Glucose-Capillary: 103 mg/dL — ABNORMAL HIGH (ref 70–99)

## 2021-03-14 MED ORDER — CHLORTHALIDONE 25 MG PO TABS: 25.0000 mg | ORAL_TABLET | Freq: Every day | ORAL | Status: DC

## 2021-03-14 MED ORDER — MELATONIN 5 MG PO TABS
5.0000 mg | ORAL_TABLET | Freq: Every evening | ORAL | Status: DC | PRN
  Administered 2021-03-14 – 2021-04-12 (×18): 5 mg via ORAL
  Filled 2021-03-14 (×20): qty 1

## 2021-03-14 MED ORDER — CARVEDILOL 6.25 MG PO TABS
6.2500 mg | ORAL_TABLET | Freq: Two times a day (BID) | ORAL | Status: DC
  Administered 2021-03-14 – 2021-04-02 (×38): 6.25 mg via ORAL
  Filled 2021-03-14 (×41): qty 1

## 2021-03-14 NOTE — Progress Notes (Signed)
CSW spoke with Elie Confer of [employer] regarding patient's Medicaid application - Elie Confer able to submit application for patient on Friday with the supporting provider letter without requiring a HCPOA being in place. CSW informed Elie Confer of sister's willingness to complete HCPOA paperwork this coming weekend.  Madilyn Fireman, MSW, LCSW Transitions of Care  Clinical Social Worker II [phone]

## 2021-03-14 NOTE — Progress Notes (Signed)
Physical Therapy Treatment Patient Details Name: Chad Coleman MRN: [id number] DOB: Apr 04, 1961 Today's Date: 03/14/2021    History of Present Illness 60 yo male presents to [facility] on 3/28 with acute onset of L weakness, numbness, and L facial droop. CTH R BG ICH, suspect hypertensive.  Fall on 02/28/21. imaging showing increased in R intraparenchymal hematoma with worsening leftward midline shift on 4/7. PMH includes hypertension, tobacco use, noncompliance with antihypertensive medications, and cocaine abuse.    PT Comments    Pt excited to be able to eat new foods per speech and relayed that he enjoyed the muffin and oreos he had earlier. Pt worked on rolling and SL to sit in each direction to increase activation L side where possible. Pt did better going to R side of bed. Sitting balance and reaching activities performed EOB working towards maintaining midline for increased periods of time. Pt then performed sit<>stand x3 with use of IV pole to pt's R and mod A on L. Pt performed pregait activities in standing to help limited knee control on L. Pt very fatigued after session. PT will continue to follow.    Follow Up Recommendations  Supervision/Assistance - 24 hour;SNF     Equipment Recommendations  Other (comment)    Recommendations for Other Services       Precautions / Restrictions Precautions Precautions: Fall Precaution Comments: Pusher, L neglect, impulsive Restrictions Weight Bearing Restrictions: No    Mobility  Bed Mobility Overal bed mobility: Needs Assistance Bed Mobility: Rolling;Sidelying to Sit;Sit to Supine Rolling: Max assist Sidelying to sit: Max assist   Sit to supine: Max assist   General bed mobility comments: practiced rolling and sitting up to each side. Did better going to R so that he could push self up from R SL. Was able to hook LLE with RLE with assist but also needed assist to "unhook"    Transfers Overall transfer level: Needs  assistance Equipment used:  (IV pole) Transfers: Sit to/from Stand Sit to Stand: Mod assist;+2 safety/equipment         General transfer comment: pt gets to midline best when reach with R hand to right so IV pole placed to R of pt and stabilized by tech. Mod A on L side while pt performed sit>stand x3  Ambulation/Gait             General Gait Details: pregait activities performed in standing focusing on narrowing BOS and L knee control   Stairs             Wheelchair Mobility    Modified Rankin (Stroke Patients Only) Modified Rankin (Stroke Patients Only) Pre-Morbid Rankin Score: No symptoms Modified Rankin: Severe disability     Balance Overall balance assessment: Needs assistance Sitting-balance support: Feet supported Sitting balance-Leahy Scale: Poor Sitting balance - Comments: sat EOB x20 mins with WB'ing through RUE and LUE intermittently. Used R shoulder lean against HOB as rest position as pt fatigues quickly when working on midline sitting. Performed reaching activities in multiple planes Postural control: Left lateral lean Standing balance support: Bilateral upper extremity supported Standing balance-Leahy Scale: Poor Standing balance comment: Reliant on UE support and physical A for postural correction                            Cognition Arousal/Alertness: Awake/alert Behavior During Therapy: Flat affect Overall Cognitive Status: Impaired/Different from baseline Area of Impairment: Attention;Memory;Following commands;Safety/judgement;Awareness;Problem solving  Current Attention Level: Selective Memory: Decreased short-term memory Following Commands: Follows one step commands consistently;Follows multi-step commands inconsistently (with cues) Safety/Judgement: Decreased awareness of deficits;Decreased awareness of safety Awareness: Emergent Problem Solving: Slow processing;Requires verbal cues;Requires tactile  cues General Comments: pt inattentive to L hand position, vc's needed to keep LUE safe with mobility. Pt able to relay that he is able to eat new foods now and excited for dinner      Exercises      General Comments General comments (skin integrity, edema, etc.): VSS. STM to R lateral neck and SCM after session      Pertinent Vitals/Pain Pain Assessment: Faces Faces Pain Scale: Hurts even more Pain Location: R shoulder Pain Descriptors / Indicators: Grimacing;Guarding Pain Intervention(s): Limited activity within patient's tolerance;Monitored during session    Home Living                      Prior Function            PT Goals (current goals can now be found in the care plan section) Acute Rehab PT Goals Patient Stated Goal: get better PT Goal Formulation: With patient Time For Goal Achievement: 03/08/21 Potential to Achieve Goals: Good Progress towards PT goals: Progressing toward goals    Frequency    Min 3X/week      PT Plan Current plan remains appropriate    Co-evaluation              AM-PAC PT "6 Clicks" Mobility   Outcome Measure  Help needed turning from your back to your side while in a flat bed without using bedrails?: A Lot Help needed moving from lying on your back to sitting on the side of a flat bed without using bedrails?: A Lot Help needed moving to and from a bed to a chair (including a wheelchair)?: Total Help needed standing up from a chair using your arms (e.g., wheelchair or bedside chair)?: A Lot Help needed to walk in hospital room?: Total Help needed climbing 3-5 steps with a railing? : Total 6 Click Score: 9    End of Session Equipment Utilized During Treatment: Gait belt Activity Tolerance: Patient tolerated treatment well Patient left: with call bell/phone within reach;in bed;with bed alarm set Nurse Communication: Mobility status;Other (comment) (need for dinner ordering) PT Visit Diagnosis: Hemiplegia and  hemiparesis;Unsteadiness on feet (R26.81);Muscle weakness (generalized) (M62.81);Difficulty in walking, not elsewhere classified (R26.2);Other symptoms and signs involving the nervous system (R29.898) Hemiplegia - Right/Left: Left Hemiplegia - dominant/non-dominant: Dominant Hemiplegia - caused by: Nontraumatic intracerebral hemorrhage     Time: 1403-1440 PT Time Calculation (min) (ACUTE ONLY): 37 min  Charges:  $Gait Training: 8-22 mins $Therapeutic Activity: 8-22 mins                     [place]  Pager [phone] Office [place] 03/14/2021, 2:58 PM

## 2021-03-14 NOTE — Progress Notes (Addendum)
Progress Note    Ravis Herne   GQQ:[id number]  DOB: Feb 06, 1961  DOA: 02/21/2021     21  PCP: Patient, No Pcp Per (Inactive)  CC: left weakness, numbness, left facial droop  Hospital Course: Mr. Sallade is a 60 year old male with PMH hypertension, cocaine use, tobacco abuse admitted on 02/21/2021 with a code stroke was found to have intracranial hemorrhage in the right basal ganglia secondary to hypertension and cocaine use. Patient was transferred from neurology service to hospitalist service on 02/26/2021. Therapy evaluations recommending SNF. He had a fall on 03/03/21, he underwentCT head showing increased size of the intraparenchymal hematoma with worsening leftward midline shift, measuring 8 mm, effaced right lateral ventricle. He was transferred to [place], neurosurgery consulted and a repeat CT head was ordered which was essentially stable . Pt had another fall in the room on 03/05/21, repeat CT head without contrast showedunchanged appearance of right basal ganglia intraparenchymal hematoma with 7 mm of leftward midline shift. He was ordered to have a Air cabin crew and waist belt for ongoing safety during hospitalization.  He exhibited some impulsive behavior and was redirected as needed.  Interval History:  No events overnight. Resting in bed this am.  Was asking if he could have pancakes this morning.  Stated we would need a repeat swallow evaluation.  This was done later in the morning and he did pass and his diet was further advanced.  ROS: Constitutional: negative for chills and fevers, Respiratory: negative for cough, Cardiovascular: negative for chest pain and Gastrointestinal: negative for abdominal pain  Assessment & Plan:  Hypertension - starting to improve with regimen adjustment - continue lisinopril, amlodipine, hydralazine - add coreg and will further adjust as needed  Intracranial hemorrhage with associated brain injury Mechanical falls, 4/7/22and 03/05/21. -  Goal SBP <140/90  - Echo showed left EF 65 - 70%, Gr 1 DD - LDL is 52 - Speech evaluation recommending dysphagia 2 dietwith thin liquid. - impulsive and multiple falls; continue restraints as needed - continue Zoloft and Depakote (d/c tramadol as this can lower seizure threshold)  NSVT - d/c tele - start MVI - s/p electrolyte replacement  Right anterior shoulder pain -No crepitus on exam -Suspect mild muscle strain as this is extremity patient uses to reposition self in bed post stroke -Continue heat and Voltaren gel  Physical Deconditioning - awaiting SNF  Hyperlipidemia Continue with statin.  Type 2 diabetes mellitus uncontrolled with hyperglycemia Hemoglobin A1 c is 6.4% Continue with Metformin. Continue with SSI.   Hyponatremia Likely secondary to poor oral intake. Thiazide diuretic has been discontinued.  Hypokalemia Replaced  Mild abdominal pain abd film is negative for obstruction  Mild elevation of liver enzymes [place] ABD ordered and is negative.   Old records reviewed in assessment of this patient  Antimicrobials: n/a  DVT prophylaxis: Place and maintain sequential compression device Start: 03/07/21 0801   Code Status:   Code Status: Full Code Family Communication:   Disposition Plan: Status is: Inpatient  Remains inpatient appropriate because:Inpatient level of care appropriate due to severity of illness   Dispo: The patient is from: Home              Anticipated d/c is to: SNF              Patient currently is medically stable to d/c.   Difficult to place patient Yes  Risk of unplanned readmission score: Unplanned Admission- Pilot do not use: 17.99   Objective: Blood pressure (!) 142/92, pulse  76, temperature 98.4 F (36.9 C), temperature source Oral, resp. rate 18, height 5\' 9"  (1.753 m), weight 73.2 kg, SpO2 99 %.  Examination: General appearance: alert, cooperative and no distress Head: Normocephalic, without obvious  abnormality, atraumatic Eyes: EOMI Lungs: clear to auscultation bilaterally Heart: regular rate and rhythm and S1, S2 normal Abdomen: normal findings: bowel sounds normal and soft, non-tender Extremities: no edema Skin: mobility and turgor normal Neurologic: Left-sided hemiplegia.  Sensation intact.  Follows commands.  Consultants:   Neurosurgery -signed off  Procedures:     Data Reviewed: I have personally reviewed following labs and imaging studies Results for orders placed or performed during the hospital encounter of 02/21/21 (from the past 24 hour(s))  Glucose, capillary     Status: Abnormal   Collection Time: 03/13/21 11:26 AM  Result Value Ref Range   Glucose-Capillary 109 (H) 70 - 99 mg/dL  Glucose, capillary     Status: Abnormal   Collection Time: 03/13/21  4:16 PM  Result Value Ref Range   Glucose-Capillary 107 (H) 70 - 99 mg/dL  Glucose, capillary     Status: Abnormal   Collection Time: 03/13/21  9:29 PM  Result Value Ref Range   Glucose-Capillary 183 (H) 70 - 99 mg/dL   Comment 1 Notify RN    Comment 2 Document in Chart   Glucose, capillary     Status: Abnormal   Collection Time: 03/14/21  6:08 AM  Result Value Ref Range   Glucose-Capillary 105 (H) 70 - 99 mg/dL   Comment 1 Notify RN    Comment 2 Document in Chart     No results found for this or any previous visit (from the past 240 hour(s)).   Radiology Studies: No results found. CT HEAD WO CONTRAST  Final Result    CT HEAD WO CONTRAST  Final Result    CT HEAD WO CONTRAST  Final Result    US Abdomen Complete  Final Result    DG Abd 2 Views  Final Result    MR BRAIN W WO CONTRAST  Final Result    CT HEAD WO CONTRAST  Final Result    CT ANGIO NECK CODE STROKE  Final Result  Addendum 1 of 1  ADDENDUM REPORT: 02/21/2021 18:37    ADDENDUM:  These results were called by telephone at the time of interpretation  on 02/21/2021 at 6:35 pm to provider [facility] , who verbally   acknowledged these results.    Additionally, given the presence of retained metallic fragments  within the right neck, a contrast-enhanced neck CT (rather than an  MRI) should be performed for further evaluation of the described  left neck mass. This was also discussed with Dr. Donnetta Simpers  at 6:35 p.m. on 02/21/2021.      Electronically Signed    By: Kellie Simmering DO    On: 02/21/2021 18:37      Final    CT ANGIO HEAD CODE STROKE  Final Result  Addendum 1 of 1  ADDENDUM REPORT: 02/21/2021 18:37    ADDENDUM:  These results were called by telephone at the time of interpretation  on 02/21/2021 at 6:35 pm to provider [facility] , who verbally  acknowledged these results.    Additionally, given the presence of retained metallic fragments  within the right neck, a contrast-enhanced neck CT (rather than an  MRI) should be performed for further evaluation of the described  left neck mass. This was also discussed with Dr. Alferd Patee [facility]  at 6:35 p.m. on 02/21/2021.      Electronically Signed    By: Kellie Simmering DO    On: 02/21/2021 18:37      Final    CT HEAD CODE STROKE WO CONTRAST  Final Result    DG Swallowing Func-Speech Pathology    (Results Pending)    Scheduled Meds: . amLODipine  10 mg Oral Daily  . atorvastatin  20 mg Oral Daily  . carvedilol  6.25 mg Oral BID WC  . diclofenac Sodium  4 g Topical QID  . feeding supplement  1 Container Oral TID BM  . hydrALAZINE  100 mg Oral Q8H  . insulin aspart  0-5 Units Subcutaneous QHS  . insulin aspart  0-9 Units Subcutaneous TID WC  . lisinopril  40 mg Oral Daily  . metFORMIN  500 mg Oral Q breakfast  . multivitamin with minerals  1 tablet Oral Daily  . pantoprazole  40 mg Oral QHS  . polyethylene glycol  17 g Oral Daily  . senna-docusate  1 tablet Oral BID  . sertraline  25 mg Oral Daily  . valproic acid  250 mg Oral BID   PRN Meds: acetaminophen **OR** acetaminophen (TYLENOL) oral liquid 160  mg/5 mL **OR** acetaminophen, alum & mag hydroxide-simeth, hydrALAZINE, hydroxypropyl methylcellulose / hypromellose, labetalol, melatonin, ondansetron (ZOFRAN) IV Continuous Infusions:   LOS: 21 days    Dwyane Dee, MD [facility] 03/14/2021, 9:00 AM

## 2021-03-14 NOTE — Progress Notes (Addendum)
  Speech Language Pathology Treatment: Dysphagia;Cognitive-Linquistic  Patient Details Name: Chad Coleman MRN: [id number] DOB: [birth year] Today's Date: 03/14/2021 Time: 3009-2330 SLP Time Calculation (min) (ACUTE ONLY): 26 min  Assessment / Plan / Recommendation Clinical Impression  SWALLOWING Pt participated in trials of upgraded solid textures today.  Pt consumed around 75% of a (large) blueberry muffin, oreos, and thin liquid by straw.  There were no clinical s/s of aspiration.  There was occasional L side anterior loss of solids and trace escape of liquid.  There was frequent L sided pocketing, but pt consistently demonstrated the ability to clear L side of oral cavity with use of lingual sweep, pt benefited from intermittent cuing. Pt also independently used liquid wash to help moisten and clear solids.  Pt has been craving pancakes and is eager to upgrade diet texture. Pt was positioned upright in bed, but frequently drifted to L side.  Pt benefited from repositioning.  When pt is at midline, L pocketing seemed decreased and oral clearance was easier to achieve.  Recommend mechanical soft solid with intermittent supervision at meal times and aspiration precautions as noted below.   COGNITION Pt benefited from intermittent-frequent verbal cues to attend to task today (PO trials). Pt was eager fordiet upgrade and agreeable to snacks provided, but still needed reminders to continue eating.  RN also reports that pt has been stopping his PO intake when feeding himself. Pt would benefit from intermittent supervision to cue for attention and to ensure compliance with aspiration precautions.    Of note: pt's speech was 100% intelligible today and free from dysarthria    HPI HPI: 60 y.o. male with a medical history significant for hypertension, tobacco use, noncompliance with antihypertensive medications, and cocaine abuse who presented to the ED via EMS as a Code Stroke for evaluation of acute  onset of left-sided weakness, numbness, and left facial droop. Dx right basal ganglia ICH. Evening of 4/7 pt fell from bed with CT revealing Increased size of 5.8 x 3.6 cm right intraparenchymal hematoma  with worsening leftward midline shift and eval. Cognitive re-assessment ordered.      SLP Plan  Continue with current plan of care       Recommendations  Diet recommendations: Dysphagia 3 (mechanical soft);Thin liquid  Liquids provided via: Cup;Straw  Medication Administration: Whole meds with puree  Supervision: Intermittent supervision to cue for compensatory strategies;Patient able to self feed;Staff to assist with self feeding  Compensations:   Minimize environmental distractions;  Slow rate;  Small sips/bites;  Lingual sweep for clearance of pocketing;  Follow solids with liquid;  verbal cues to continue eating  Postural Changes and/or Swallow Maneuvers: Seated upright 90 degrees - midline position                Oral Care Recommendations: Oral care BID Follow up Recommendations: Skilled Nursing facility SLP Visit Diagnosis: Cognitive communication deficit (R41.841);Dysarthria and anarthria (R47.1);Dysphagia, unspecified (R13.10) Plan: Continue with current plan of care       [place], [place], [place] Office: [phone] 03/14/2021, 11:27 AM

## 2021-03-14 NOTE — Plan of Care (Signed)
  Problem: Coping: Goal: Will verbalize positive feelings about self Outcome: Progressing Goal: Will identify appropriate support needs Outcome: Progressing   Problem: Health Behavior/Discharge Planning: Goal: Ability to manage health-related needs will improve Outcome: Progressing   Problem: Self-Care: Goal: Ability to participate in self-care as condition permits will improve Outcome: Progressing Goal: Verbalization of feelings and concerns over difficulty with self-care will improve Outcome: Progressing Goal: Ability to communicate needs accurately will improve Outcome: Progressing   Problem: Nutrition: Goal: Risk of aspiration will decrease Outcome: Progressing Goal: Dietary intake will improve Outcome: Progressing   Problem: Intracerebral Hemorrhage Tissue Perfusion: Goal: Complications of Intracerebral Hemorrhage will be minimized Outcome: Progressing   Problem: Safety: Goal: Non-violent Restraint(s) Outcome: Progressing   Problem: Education: Goal: Knowledge of General Education information will improve Description: Including pain rating scale, medication(s)/side effects and non-pharmacologic comfort measures Outcome: Progressing   Problem: Health Behavior/Discharge Planning: Goal: Ability to manage health-related needs will improve Outcome: Progressing   Problem: Clinical Measurements: Goal: Ability to maintain clinical measurements within normal limits will improve Outcome: Progressing Goal: Will remain free from infection Outcome: Progressing Goal: Diagnostic test results will improve Outcome: Progressing Goal: Respiratory complications will improve Outcome: Progressing Goal: Cardiovascular complication will be avoided Outcome: Progressing   Problem: Activity: Goal: Risk for activity intolerance will decrease Outcome: Progressing   Problem: Nutrition: Goal: Adequate nutrition will be maintained Outcome: Progressing   Problem: Coping: Goal: Level  of anxiety will decrease Outcome: Progressing   Problem: Elimination: Goal: Will not experience complications related to bowel motility Outcome: Progressing Goal: Will not experience complications related to urinary retention Outcome: Progressing   Problem: Pain Managment: Goal: General experience of comfort will improve Outcome: Progressing   Problem: Safety: Goal: Ability to remain free from injury will improve Outcome: Progressing   Problem: Skin Integrity: Goal: Risk for impaired skin integrity will decrease Outcome: Progressing   Problem: Education: Goal: Knowledge of disease or condition will improve Outcome: Progressing Goal: Knowledge of secondary prevention will improve Outcome: Progressing Goal: Knowledge of patient specific risk factors addressed and post discharge goals established will improve Outcome: Progressing Goal: Individualized Educational Video(s) Outcome: Progressing   Problem: Safety: Goal: Non-violent Restraint(s) Outcome: Progressing

## 2021-03-15 LAB — GLUCOSE, CAPILLARY
Glucose-Capillary: 125 mg/dL — ABNORMAL HIGH (ref 70–99)
Glucose-Capillary: 158 mg/dL — ABNORMAL HIGH (ref 70–99)
Glucose-Capillary: 113 mg/dL — ABNORMAL HIGH (ref 70–99)
Glucose-Capillary: 107 mg/dL — ABNORMAL HIGH (ref 70–99)

## 2021-03-15 MED ORDER — TIZANIDINE HCL 4 MG PO TABS
2.0000 mg | ORAL_TABLET | Freq: Every day | ORAL | Status: DC
  Administered 2021-03-15 – 2021-04-02 (×19): 2 mg via ORAL
  Filled 2021-03-15 (×19): qty 1

## 2021-03-15 NOTE — Progress Notes (Signed)
Occupational Therapy Treatment Patient Details Name: Chad Coleman MRN: [id number] DOB: 12/19/1960 Today's Date: 03/15/2021    History of present illness 60 yo male presents to [facility] on 3/28 with acute onset of L weakness, numbness, and L facial droop. CTH R BG ICH, suspect hypertensive.  Fall on 02/28/21. imaging showing increased in R intraparenchymal hematoma with worsening leftward midline shift on 4/7. PMH includes hypertension, tobacco use, noncompliance with antihypertensive medications, and cocaine abuse.   OT comments  Pt seen in conjunction with PT to maximize pts activity tolerance and optimize pts participation. Pt continues to present with decreased activity tolerance, impaired balance and L sided weakness. Pt currently requires MODA +2 for bed mobility and MAX A +2 for stand pivot transfers from EOB>recliner. Pt requires up to [facility] for sitting balance and MAX - set- up assist for grooming tasks. Pt would continue to benefit from skilled occupational therapy while admitted and after d/c to address the below listed limitations in order to improve overall functional mobility and facilitate independence with BADL participation. DC plan remains appropriate, will follow acutely per POC.    Follow Up Recommendations  SNF    Equipment Recommendations  None recommended by OT    Recommendations for Other Services      Precautions / Restrictions Precautions Precautions: Fall Precaution Comments: Pusher, L neglect, impulsive Restrictions Weight Bearing Restrictions: No       Mobility Bed Mobility Overal bed mobility: Needs Assistance Bed Mobility: Rolling;Supine to Sit Rolling: Mod assist;Max assist   Supine to sit: Mod assist;+2 for physical assistance     General bed mobility comments: pt able to roll to L side for pericare with pt needing MODA  to fully transition into sidelying, cues needed to reach across to bed rail. MAX A to roll to R side. pt able to transition from  supine>sit with with MOD A +2 as pt able to use RLE underneath LLE to progress BLEs to EOB, pt reaching out for PT to assist with elevating trunk with RUE    Transfers Overall transfer level: Needs assistance Equipment used:  (IV pole) Transfers: Sit to/from [facility] Sit to Stand: Mod assist;+2 safety/equipment Stand pivot transfers: Max assist;+2 physical assistance;+2 safety/equipment       General transfer comment: pt able to pull into standing with pt reaching out to IV pole with RUE , MAX A +2 to pivot to recliner to pts R side with PT blocking pts L knee and helping facilitate scooting LLE to recliner, pt taking short hops with RLE while COTA assisted with maintaining balance and support on R side    Balance Overall balance assessment: Needs assistance Sitting-balance support: Feet supported Sitting balance-Leahy Scale: Poor Sitting balance - Comments: pt sitting EOB ~ 10 mins with pt leaning onto R side of  HOB as pt fatigued, worked on lateral lean to L side however pt with difficulty fully WB'ing through L side as pt overcompensates with R side. Postural control: Left lateral lean Standing balance support: Bilateral upper extremity supported Standing balance-Leahy Scale: Poor Standing balance comment: requires BUE support                           ADL either performed or assessed with clinical judgement   ADL Overall ADL's : Needs assistance/impaired Eating/Feeding: Supervision/ safety;Set up;Sitting Eating/Feeding Details (indicate cue type and reason): supervision - set- up assist seated in recliner with pt using RUE to use utensils Grooming: Maximal  assistance;Supervision/safety;Set up;Oral care Grooming Details (indicate cue type and reason): to apply lotion to pts R hand, pt able to brush teeth with R hand with set- up of paste on toothbrush Upper Body Bathing: Maximal assistance;Sitting Upper Body Bathing Details (indicate cue type and  reason): to apply lotion to pts back from EOB Lower Body Bathing: Total assistance;Bed level Lower Body Bathing Details (indicate cue type and reason): for posterior pericare d/t leaking condom catheter Upper Body Dressing : Maximal assistance;Bed level Upper Body Dressing Details (indicate cue type and reason): to don new gown     Toilet Transfer: Maximal assistance;+2 for physical assistance;Stand-pivot Toilet Transfer Details (indicate cue type and reason): simulated via stand pivot transfer from EOB>recliner to pt R side, pt using RUE on IV pole to pull pt into standing, MAX A +2 needed to pivot to R side with PT blocking LLE during transfer and facilitate scooting L foot, pt able to hop with RLE towards chair using RUE for support on COTAs arm Toileting- Clothing Manipulation and Hygiene: Total assistance;Bed level Toileting - Clothing Manipulation Details (indicate cue type and reason): posterior pericare from bed level d/t leaking condom catheter     Functional mobility during ADLs: Maximal assistance;+2 for physical assistance (stand pivot only) General ADL Comments: pt able to transfer OOB to chair, pt making improvements with sitting balance and functional sit<>stand as well as bed mobility     Vision       Perception     Praxis      Cognition Arousal/Alertness: Awake/alert;Lethargic (moments of lethargy as pt fatigued) Behavior During Therapy: WFL for tasks assessed/performed Overall Cognitive Status: Impaired/Different from baseline Area of Impairment: Attention;Following commands;Safety/judgement;Awareness;Problem solving                   Current Attention Level: Sustained   Following Commands: Follows one step commands consistently;Follows multi-step commands inconsistently Safety/Judgement: Decreased awareness of deficits;Decreased awareness of safety Awareness: Emergent Problem Solving: Slow processing;Requires verbal cues;Requires tactile cues General  Comments: pt fatiguing more as session progresses becoming lethargic with pts attention beginning to decrease. Pt requries multiomodal cues for body mechanics during functional mobility.        Exercises Hand Exercises Wrist Flexion: PROM;Left Wrist Extension: PROM;Left Digit Composite Flexion: PROM;Left Composite Extension: PROM;Left Other Exercises Other Exercises: cervical rotation R<>L as pt prefers to keep neck in L lateral flexion   Shoulder Instructions       General Comments      Pertinent Vitals/ Pain       Pain Assessment: Faces Faces Pain Scale: No hurt  Home Living                                          Prior Functioning/Environment              Frequency  Min 2X/week        Progress Toward Goals  OT Goals(current goals can now be found in the care plan section)  Progress towards OT goals: Progressing toward goals  Acute Rehab OT Goals Patient Stated Goal: to eat breakfast OT Goal Formulation: With patient Time For Goal Achievement: 03/25/21 Potential to Achieve Goals: [place] Discharge plan remains appropriate;Frequency remains appropriate    Co-evaluation      Reason for Co-Treatment: To address functional/ADL transfers;For patient/therapist safety          AM-PAC OT "  6 Clicks" Daily Activity     Outcome Measure   Help from another person eating meals?: A Little Help from another person taking care of personal grooming?: A Lot Help from another person toileting, which includes using toliet, bedpan, or urinal?: A Lot Help from another person bathing (including washing, rinsing, drying)?: A Lot Help from another person to put on and taking off regular upper body clothing?: A Lot Help from another person to put on and taking off regular lower body clothing?: Total 6 Click Score: 12    End of Session Equipment Utilized During Treatment: Gait belt  OT Visit Diagnosis: Unsteadiness on feet (R26.81);Other  abnormalities of gait and mobility (R26.89);Muscle weakness (generalized) (M62.81);Hemiplegia and hemiparesis Hemiplegia - Right/Left: Left Hemiplegia - dominant/non-dominant: Dominant Hemiplegia - caused by: Cerebral infarction   Activity Tolerance Patient tolerated treatment well   Patient Left in chair;with call bell/phone within reach;with chair alarm set;Other (comment) (posey alarm activated)   Nurse Communication Mobility status;Other (comment) (needs new condom catheter)        Time: 2233-6122 OT Time Calculation (min): 46 min  Charges: OT General Charges $OT Visit: 1 Visit OT Treatments $Self Care/Home Management : 8-22 mins  Harley Alto., COTA/L Acute Rehabilitation Services [place]   Precious Haws 03/15/2021, 11:06 AM

## 2021-03-15 NOTE — Progress Notes (Signed)
[facility] PROGRESS NOTE  Chad Coleman FOY:[id number] DOB: 07-Jun-1961 DOA: 02/21/2021 PCP: Patient, No Pcp Per (Inactive)  Status: Remains inpatient appropriate because:Altered mental status, Unsafe d/c plan and Inpatient level of care appropriate due to severity of illness   Dispo: The patient is from: Home              Anticipated d/c is to: SNF              Patient currently is medically stable to d/c.               Barriers to DC: No insurance.  Medicaid has been filed and a letter was dictated to clarify patient's underlying medical status to help qualify for Medicaid and disability.  Sister and patient discussing making sister power of attorney to aid in future healthcare decisions   Difficult to place patient Yes  4/15: FMLA paperwork completed, scanned into our system and returned by email to patient sister as requested  Level of care: Med-Surg  Code Status: Full Family Communication: 4/13 [name] [phone]. Requested my work email so she could send FMLA forms for pt. She stated she was not his POA and to her knowledge he did not have a designated POA. I told her LCSW would contact her this week to discuss guardianship process DVT prophylaxis: SCDs Vaccination status: Unknown  Foley catheter: Condom catheter  HPI: 60 year old gentleman prior history of hypertension, cocaine use, tobacco abuse admitted on 02/21/2021 with a code stroke was found to have intracranial hemorrhage in the right basal ganglia secondary to hypertension and cocaine use.  Patient was transferred from neurology service to hospitalist service on 02/26/2021. Therapy evaluations recommending SNF.  pt had a fall on 03/03/21, he underwent CT head showing increased size of the intraparenchymal hematoma with worsening leftward midline shift, now measuring 8 mm.Effaced right lateral ventricle, which may become entrapped and predisposes to hydrocephalus. He was transferred to ICU overnight, neuro  surgery consulted and a repeat CT head ordered which was essentially stable .  Pt had another fall in the room on 03/05/21, repeat CT head without contrast showed Unchanged appearance of right basal ganglia intraparenchymal hematoma with 7 mm of leftward midline shift.  Subjective: Awake and interactive.  Sitting on side of bed with 2 OT personnel assisting with therapy.  Patient having some mild issues with hypertonicity on left stroke side but also having extensive muscle tightness on the right side due to ongoing muscle strain.  Patient without any specific complaints but appears to be getting fatigued during therapy  Objective: Vitals:   03/15/21 0351 03/15/21 0700  BP: 113/78 113/70  Pulse: 67 98  Resp: 18 16  Temp: 98.2 F (36.8 C) 97.9 F (36.6 C)  SpO2: 96% 98%    Intake/Output Summary (Last 24 hours) at 03/15/2021 0814 Last data filed at 03/15/2021 0330 Gross per 24 hour  Intake 920 ml  Output 1150 ml  Net -230 ml   Filed Weights   02/25/21 1357  Weight: 73.2 kg    Exam:  Constitutional: Alert, calm, fatigued during therapy.  No acute distress otherwise Cardiovascular: Normal heart sounds, normotensive.  Regular pulse.  Skin warm and dry Abdomen:   LBM 4/17, soft nontender nondistended.  Normoactive bowel sounds. Neurologic: CN 2-12 grossly intact. Sensation intact, DTR normal. Strength 5/5 on the right with a dense, insensate left hemiplegia involving both the upper and lower extremity.  Noticed increasing hypertonicity less so actually on the stroke side but  increasing on right side due to muscle strain. Psychiatric: And oriented x3.  Did poorly on recent SLUMS screening by speech therapy with a score of 15/30   Assessment/Plan: Acute problems: Intracranial hemorrhage with associated brain injury Echocardiogram showed left ventricle ejection fraction 65 to 70% with grade 1 diastolic dysfunction. LDL 52. Speech evaluation recommending dysphagia 2 diet with thin  liquid. Due to impulsivity secondary to brain injury low-dose Zoloft and Depakote initiated with significant improvement in behaviors. Cognitive eval per OT/SLP has demonstrated deficits (see below) SLP COGNITIVE EVAL 4/8: Overall, his speech-language-cognitive function appears similar to assessment on 3/29 with exception of working memory. Severity of dysarthria was unchanged and is mild-mod in conversation. Expressive and receptive language is functional. On SLUMS assessment he scored a 15 out of possible 30 requiring cues to retrieve 3/5 on word recall, difficulty with mental calculations, accurate with 1 of 4 questions for auditory recall of paragraph. ST will continue treatment to increase independence. OT COGNITIVE EVAL 4/8:  Arousal/Alertness: Awake/alert Behavior During Therapy: Flat affect Overall Cognitive Status: Impaired/Different from baseline Area of Impairment: Attention;Memory;Following commands;Safety/judgement;Awareness;Problem solving Current Attention Level: Sustained (short time) Memory: Decreased short-term memory;Decreased recall of precautions Following Commands: Follows one step commands consistently Safety/Judgement: Decreased awareness of safety;Decreased awareness of deficits Awareness: Intellectual Problem Solving: Slow processing;Decreased initiation;Requires verbal cues;Requires tactile cues;Difficulty sequencing General Comments: Pt impulsive and requiring simple, direct cues. Follow one step commands with calm environement. At end of session, providsing questioning cues to have pt review session, pt abel to report what he did and why with Mod questioning cues    Mechanical fall on 03/03/21 and 03/05/21. A ct head without contrast was obtained, showed  increased size of the intraparenchymal hematoma with worsening leftward midline shift, now measuring 8 mm.Effaced right lateral ventricle, which may become entrapped and predisposes to hydrocephalus. He was transferred  to ICU overnight, neuro surgery consulted and a repeat CT head ordered which was essentially stable .  Pt transferred back to the floor.  Bed side sitter ordered for safety.   Right anterior shoulder pain/progressive hypertonicity -No crepitus on exam -Suspect mild muscle strain as this is extremity patient uses to reposition self in bed post stroke -Continue heat and Voltaren gel -Encouraged to also use RLE in addition to RUE when repositioning -Begin tizanidine 2 mg at bedtime-patient also having issues with insomnia.  If tolerates without sedation can add to daytime as well if continues to have issues with hypertonicity -May also need splints or other orthotic devices on affected side  Physical Deconditioning PT eval 4/14:         Pt intermittently lethargic today and reports that he was unable to sleep last night. Recommend L PRAFO as pt has not had return to L ankle. Worked with OT on bed mobility and maintaining midline in sitting as well as a WB'ing through L side and attempting to decrease activation of R side. Pt stood with mod A +2 and pivoted to R to recliner with max A +2 with facilitation of wh shifting and support to L knee. PT will continue to follow.              OT eval 4/12:   Pt seen in conjunction with PT to maximize pts activity tolerance and optimize pts participation. Pt continues to present with decreased activity tolerance, impaired balance and L sided weakness. Pt currently requires MODA +2 for bed mobility and MAX A +2 for stand pivot transfers from EOB>recliner. Pt requires up  to [facility] for sitting balance and MAX - set- up assist for grooming tasks. Pt would continue to benefit from skilled occupational therapy while admitted and after d/c to address the below listed limitations in order to improve overall functional mobility and facilitate independence with BADL participation. DC plan remains appropriate, will follow acutely per POC.     Hypertension Not well controlled.  Increase hydralazine 50 mg TID.  in addition to Norvasc 10 mg, clonidine 0.1 mg daily, isosorbide 15 mg daily, lisinopril 10 mg daily  Hyperlipidemia Continue with statin.   Type 2 diabetes mellitus uncontrolled with hyperglycemia Hemoglobin A1 c is 6.4. not on insuliin at home.  Current CBGs well-controlled over the past 24 hours with range of 91-180 Continue with Metformin. Continue with SSI.   Hyponatremia Much improved to 134.  Probably secondary to poor oral intake. Thiazide diuretic has been discontinued. Serum osmolality was slightly low at 274 with low normal 275. 4/11 no indication to continue fluid restriction therefore this was discontinued.     Other problems: Hypokalemia Replaced, repeat level wnl.   Mild abdominal pain: abd film is negative for obstruction.   Mild elevation of liver enzymes.  [place] ABD ordered and is negative.    Data Reviewed: Basic Metabolic Panel: Recent Labs  Lab 03/11/21 2236  K 3.5  MG 1.9   Liver Function Tests: No results for input(s): AST, ALT, ALKPHOS, BILITOT, PROT, ALBUMIN in the last 168 hours. No results for input(s): LIPASE, AMYLASE in the last 168 hours. No results for input(s): AMMONIA in the last 168 hours. CBC: No results for input(s): WBC, NEUTROABS, HGB, HCT, MCV, PLT in the last 168 hours. Cardiac Enzymes: No results for input(s): CKTOTAL, CKMB, CKMBINDEX, TROPONINI in the last 168 hours. BNP (last 3 results) No results for input(s): BNP in the last 8760 hours.  ProBNP (last 3 results) No results for input(s): PROBNP in the last 8760 hours.  CBG: Recent Labs  Lab 03/14/21 0608 03/14/21 1203 03/14/21 1610 03/14/21 2118 03/15/21 0614  GLUCAP 105* 158* 124* 103* 125*    No results found for this or any previous visit (from the past 240 hour(s)).   Studies: No results found.  Scheduled Meds: . amLODipine  10 mg Oral Daily  . atorvastatin  20 mg Oral  Daily  . carvedilol  6.25 mg Oral BID WC  . diclofenac Sodium  4 g Topical QID  . feeding supplement  1 Container Oral TID BM  . hydrALAZINE  100 mg Oral Q8H  . insulin aspart  0-5 Units Subcutaneous QHS  . insulin aspart  0-9 Units Subcutaneous TID WC  . lisinopril  40 mg Oral Daily  . metFORMIN  500 mg Oral Q breakfast  . multivitamin with minerals  1 tablet Oral Daily  . pantoprazole  40 mg Oral QHS  . polyethylene glycol  17 g Oral Daily  . senna-docusate  1 tablet Oral BID  . sertraline  25 mg Oral Daily  . valproic acid  250 mg Oral BID   Continuous Infusions:  Active Problems:   Intraparenchymal hemorrhage of brain (HCC)   Hypertension   Tobacco abuse   Hyponatremia   Leukocytosis   Controlled type 2 diabetes mellitus with hyperglycemia, without long-term current use of insulin ([place])   Brain injury with brief loss of consciousness ([place])   Acute pain of right shoulder   Consultants:  Neurology  Neurosurgery  Procedures:  Echocardiogram  Antibiotics: Anti-infectives (From admission, onward)   None  Time spent: 35 minutes    Erin Hearing ANP  [facility] 7 am - 330 pm/M-F for direct patient care and secure chat Please refer to Amion for contact info 22  days

## 2021-03-15 NOTE — Progress Notes (Signed)
Orthopedic Tech Progress Note Patient Details:  Chad Coleman 1961/05/15 [id number]  Ortho Devices Type of Ortho Device: Prafo boot/shoe Ortho Device/Splint Location: LLE Ortho Device/Splint Interventions: Ordered,Application,Adjustment   Post Interventions Patient Tolerated: Well Instructions Provided: Care of device   Janit Pagan 03/15/2021, 4:19 PM

## 2021-03-15 NOTE — Progress Notes (Signed)
Remains impulsive and does try to get out of bed occasionally.  Does not seem to grasp that he can fall and get injured if he keeps trying to climb out of the bed and also if he keeps putting his right foot over the rail.  He requested that the right rail be up to help him pull himself up in the bed but then he tries to get climb over the rail.  He does not attempt to get out of bed on the left side where the rail is down.  He does have the mats on the floor and he has been reminded that he has fallen a couple times here in the hospital.

## 2021-03-15 NOTE — Progress Notes (Signed)
Occupational Therapy Treatment Patient Details Name: Chad Coleman MRN: [id number] DOB: 12-Jun-1961 Today's Date: 03/15/2021    History of present illness 60 yo male presents to [facility] on 3/28 with acute onset of L weakness, numbness, and L facial droop. CTH R BG ICH, suspect hypertensive.  Fall on 02/28/21. imaging showing increased in R intraparenchymal hematoma with worsening leftward midline shift on 4/7. PMH includes hypertension, tobacco use, noncompliance with antihypertensive medications, and cocaine abuse.   OT comments  Returned for second session as pt noted to be leaning out of chair soiled with stool. Total A +2 for all aspects of pericare and UB/ LB ADLs from recliner.MAX A +2 to pivot back to bed towards R side. Pt very fatigued from being up in chair most of day. Pt would continue to benefit from skilled occupational therapy while admitted and after d/c to address the below listed limitations in order to improve overall functional mobility and facilitate independence with BADL participation. DC plan remains appropriate, will follow acutely per POC.    Follow Up Recommendations  SNF    Equipment Recommendations  None recommended by OT    Recommendations for Other Services      Precautions / Restrictions Precautions Precautions: Fall Precaution Comments: Pusher, L neglect, impulsive Restrictions Weight Bearing Restrictions: No       Mobility Bed Mobility Overal bed mobility: Needs Assistance Bed Mobility: Sit to Supine       Sit to supine: Max assist   General bed mobility comments: pt able to lower trunk down to pts R side, MAX A to assist with elevating BLEs back to bed    Transfers Overall transfer level: Needs assistance Equipment used: 2 person hand held assist Transfers: Sit to/from [facility] Sit to Stand: Mod assist;+2 safety/equipment Stand pivot transfers: Max assist;+2 physical assistance;+2 safety/equipment       General  transfer comment: pt able to push into standing with RUE positioned on R arm rest of chair, MAX A +2 to pivot to recliner to pts R side with OTA  blocking pts L knee and helping facilitate scooting LLE to recliner, pt taking short hops with RLE while tech assisted with maintaining balance and support on R side    Balance Overall balance assessment: Needs assistance Sitting-balance support: Feet supported Sitting balance-Leahy Scale: Poor Sitting balance - Comments: heavy L lateral lean even from recliner as pt fatigued   Standing balance support: Bilateral upper extremity supported Standing balance-Leahy Scale: Poor Standing balance comment: requires BUE support and max A +2 for dynamic activity                           ADL either performed or assessed with clinical judgement   ADL               Lower Body Bathing: Total assistance;Bed level Lower Body Bathing Details (indicate cue type and reason): for posterior pericare d/t incontinent BM in chair Upper Body Dressing : Maximal assistance Upper Body Dressing Details (indicate cue type and reason): to don new gown     Toilet Transfer: Maximal assistance;+2 for physical assistance;Stand-pivot Toilet Transfer Details (indicate cue type and reason): simulated via stand pivot transfer from recliner>EOB to pt R side, pt able to sit <>stand from recliner with pt able to power up with RUE pushing on arm rest of chair, MAX A +2 needed to pivot to R side with OTA blocking LLE during transfer and facilitating scooting  L foot, pt able to hop with RLE towards chair using RUE for support on techs arm Toileting- Clothing Manipulation and Hygiene: Total assistance;Bed level Toileting - Clothing Manipulation Details (indicate cue type and reason): posterior pericare from bed level d/t incontinent BM     Functional mobility during ADLs: Maximal assistance;+2 for physical assistance (stand pivot to R side) General ADL Comments: returned  for second session as pt leaning in chair soiled in stool     Vision       Perception     Praxis      Cognition Arousal/Alertness: Awake/alert Behavior During Therapy: WFL for tasks assessed/performed Overall Cognitive Status: Impaired/Different from baseline Area of Impairment: Attention;Following commands;Safety/judgement;Awareness;Problem solving                   Current Attention Level: Sustained   Following Commands: Follows one step commands consistently;Follows multi-step commands inconsistently Safety/Judgement: Decreased awareness of deficits;Decreased awareness of safety Awareness: Emergent Problem Solving: Slow processing;Requires verbal cues;Requires tactile cues          Exercises     Shoulder Instructions       General Comments      Pertinent Vitals/ Pain       Pain Assessment: Faces Faces Pain Scale: Hurts little more Pain Location: R shoulder Pain Intervention(s): Limited activity within patient's tolerance;Monitored during session;Repositioned  Home Living                                          Prior Functioning/Environment              Frequency  Min 2X/week        Progress Toward Goals  OT Goals(current goals can now be found in the care plan section)  Progress towards OT goals: Progressing toward goals  Acute Rehab OT Goals Patient Stated Goal: to get back in bed OT Goal Formulation: With patient Time For Goal Achievement: 03/25/21 Potential to Achieve Goals: [place] Discharge plan remains appropriate;Frequency remains appropriate    Co-evaluation                 AM-PAC OT "6 Clicks" Daily Activity     Outcome Measure   Help from another person eating meals?: A Little Help from another person taking care of personal grooming?: A Lot Help from another person toileting, which includes using toliet, bedpan, or urinal?: A Lot Help from another person bathing (including washing,  rinsing, drying)?: A Lot Help from another person to put on and taking off regular upper body clothing?: A Lot Help from another person to put on and taking off regular lower body clothing?: Total 6 Click Score: 12    End of Session Equipment Utilized During Treatment: Gait belt  OT Visit Diagnosis: Unsteadiness on feet (R26.81);Other abnormalities of gait and mobility (R26.89);Muscle weakness (generalized) (M62.81);Hemiplegia and hemiparesis Hemiplegia - Right/Left: Left Hemiplegia - dominant/non-dominant: Dominant Hemiplegia - caused by: Cerebral infarction   Activity Tolerance Patient tolerated treatment well   Patient Left in bed;with call bell/phone within reach;with bed alarm set;with restraints reapplied;Other (comment) (posey belt applied)   Nurse Communication          Time: [phone] OT Time Calculation (min): 26 min  Charges: OT General Charges $OT Visit: 1 Visit OT Treatments $Self Care/Home Management : [place] COTA/L Acute Rehabilitation Services [phone]  Corinne Ports [facility] 03/15/2021, 5:28 PM

## 2021-03-15 NOTE — Progress Notes (Signed)
CSW spoke with Alma Friendly at [facility] in [place] who is agreeable to review referral - referral sent via secure email.  Madilyn Fireman, MSW, LCSW Transitions of Care  Clinical Social Worker II [phone]

## 2021-03-15 NOTE — Progress Notes (Signed)
Physical Therapy Treatment Patient Details Name: Chad Coleman MRN: [id number] DOB: 24-Jun-1961 Today's Date: 03/15/2021    History of Present Illness 60 yo male presents to [facility] on 3/28 with acute onset of L weakness, numbness, and L facial droop. CTH R BG ICH, suspect hypertensive.  Fall on 02/28/21. imaging showing increased in R intraparenchymal hematoma with worsening leftward midline shift on 4/7. PMH includes hypertension, tobacco use, noncompliance with antihypertensive medications, and cocaine abuse.    PT Comments    Pt intermittently lethargic today and reports that he was unable to sleep last night. Recommend L PRAFO as pt has not had return to L ankle. Worked with OT on bed mobility and maintaining midline in sitting as well as a WB'ing through L side and attempting to decrease activation of R side. Pt stood with mod A +2 and pivoted to R to recliner with max A +2 with facilitation of wh shifting and support to L knee. PT will continue to follow.     Follow Up Recommendations  Supervision/Assistance - 24 hour;SNF     Equipment Recommendations  None recommended by PT    Recommendations for Other Services       Precautions / Restrictions Precautions Precautions: Fall Precaution Comments: Pusher, L neglect, impulsive Restrictions Weight Bearing Restrictions: No    Mobility  Bed Mobility Overal bed mobility: Needs Assistance Bed Mobility: Rolling;Supine to Sit Rolling: Mod assist Sidelying to sit: Mod assist Supine to sit: Mod assist;+2 for physical assistance     General bed mobility comments: pt needed mod A to LE's with R ankle hooked under L ankle, able to pull trunk up with use of bed rail. Mod A to position EOB due to immediate L lean once up    Transfers Overall transfer level: Needs assistance Equipment used:  (IV pole) Transfers: Sit to/from [facility] Sit to Stand: Mod assist;+2 safety/equipment Stand pivot transfers: Max assist;+2  physical assistance;+2 safety/equipment       General transfer comment: pt able to pull into standing with pt reaching out to IV pole with RUE , MAX A +2 to pivot to recliner to pts R side with PT blocking pts L knee and helping facilitate scooting LLE to recliner, pt taking short hops with RLE while COTA assisted with maintaining balance and support on R side  Ambulation/Gait                 Stairs             Wheelchair Mobility    Modified Rankin (Stroke Patients Only) Modified Rankin (Stroke Patients Only) Pre-Morbid Rankin Score: No symptoms Modified Rankin: Severe disability     Balance Overall balance assessment: Needs assistance Sitting-balance support: Feet supported Sitting balance-Leahy Scale: Poor Sitting balance - Comments: pt sitting EOB ~ 25 mins with work on [employer] through L elbow (difficult to achieve full WB'ing due to strong muscular activity R side body) as well as using R hand to push through L knee for LLE WB'ing. Also worked on right in sitting and coming to midline. Postural control: Left lateral lean Standing balance support: Bilateral upper extremity supported Standing balance-Leahy Scale: Poor Standing balance comment: requires BUE support and max A +2 for dynamic activity               High Level Balance Comments: worked on RUE reaching in sitting, when pt reaching to R able to maintain sitting with min A.  Cognition Arousal/Alertness: Awake/alert;Lethargic (moments of lethargy as pt fatigued) Behavior During Therapy: WFL for tasks assessed/performed Overall Cognitive Status: Impaired/Different from baseline Area of Impairment: Attention;Following commands;Safety/judgement;Awareness;Problem solving                   Current Attention Level: Sustained Memory: Decreased short-term memory Following Commands: Follows one step commands consistently;Follows multi-step commands inconsistently Safety/Judgement:  Decreased awareness of deficits;Decreased awareness of safety Awareness: Emergent Problem Solving: Slow processing;Requires verbal cues;Requires tactile cues General Comments: pt fatiguing more as session progresses becoming lethargic with pts attention beginning to decrease. Pt requries multimodal cues for body mechanics during functional mobility.      Exercises Hand Exercises Wrist Flexion: PROM;Left Wrist Extension: PROM;Left Digit Composite Flexion: PROM;Left Composite Extension: PROM;Left Other Exercises Other Exercises: stretching of L sided neck    General Comments General comments (skin integrity, edema, etc.): VSS. Pt very fatigued end of session and mentioned that he was unable to sleep last night. Rec PRAFO for L foot since he is not getting return at this point.      Pertinent Vitals/Pain Pain Assessment: Faces Faces Pain Scale: Hurts little more Pain Location: R shoulder Pain Descriptors / Indicators: Grimacing;Guarding Pain Intervention(s): Limited activity within patient's tolerance;Monitored during session;Other (comment) (STM to R neck)    Home Living                      Prior Function            PT Goals (current goals can now be found in the care plan section) Acute Rehab PT Goals Patient Stated Goal: to eat breakfast PT Goal Formulation: With patient Time For Goal Achievement: 03/22/21 Potential to Achieve Goals: Good Progress towards PT goals: Progressing toward goals    Frequency    Min 3X/week      PT Plan Current plan remains appropriate    Co-evaluation PT/OT/SLP Co-Evaluation/Treatment: Yes Reason for Co-Treatment: Complexity of the patient's impairments (multi-system involvement);Necessary to address cognition/behavior during functional activity;For patient/therapist safety PT goals addressed during session: Mobility/safety with mobility;Balance;Strengthening/ROM        AM-PAC PT "6 Clicks" Mobility   Outcome Measure   Help needed turning from your back to your side while in a flat bed without using bedrails?: A Lot Help needed moving from lying on your back to sitting on the side of a flat bed without using bedrails?: A Lot Help needed moving to and from a bed to a chair (including a wheelchair)?: A Lot Help needed standing up from a chair using your arms (e.g., wheelchair or bedside chair)?: A Lot Help needed to walk in hospital room?: Total Help needed climbing 3-5 steps with a railing? : Total 6 Click Score: 10    End of Session Equipment Utilized During Treatment: Gait belt Activity Tolerance: Patient tolerated treatment well Patient left: in chair;with chair alarm set;with call bell/phone within reach Nurse Communication: Mobility status PT Visit Diagnosis: Hemiplegia and hemiparesis;Unsteadiness on feet (R26.81);Muscle weakness (generalized) (M62.81);Difficulty in walking, not elsewhere classified (R26.2);Other symptoms and signs involving the nervous system (R29.898) Hemiplegia - Right/Left: Left Hemiplegia - dominant/non-dominant: Dominant Hemiplegia - caused by: Nontraumatic intracerebral hemorrhage     Time: 0949-1030 PT Time Calculation (min) (ACUTE ONLY): 41 min  Charges:  $Therapeutic Activity: 23-37 mins                     Leighton Roach, PT  Acute Rehab Services  Pager [phone] Office [phone]  [place] 03/15/2021, 12:04 PM

## 2021-03-15 NOTE — Progress Notes (Signed)
Progress Note    Chad Coleman   IOM:[id number]  DOB: 09/09/1961  DOA: 02/21/2021     22  PCP: Patient, No Pcp Per (Inactive)  CC: left weakness, numbness, left facial droop  Hospital Course: Mr. Ky is a 60 year old male with PMH hypertension, cocaine use, tobacco abuse admitted on 02/21/2021 with a code stroke was found to have intracranial hemorrhage in the right basal ganglia secondary to hypertension and cocaine use. Patient was transferred from neurology service to hospitalist service on 02/26/2021. Therapy evaluations recommending SNF. He had a fall on 03/03/21, he underwentCT head showing increased size of the intraparenchymal hematoma with worsening leftward midline shift, measuring 8 mm, effaced right lateral ventricle. He was transferred to [place], neurosurgery consulted and a repeat CT head was ordered which was essentially stable . Pt had another fall in the room on 03/05/21, repeat CT head without contrast showedunchanged appearance of right basal ganglia intraparenchymal hematoma with 7 mm of leftward midline shift. He was ordered to have a Air cabin crew and waist belt for ongoing safety during hospitalization.  He exhibited some impulsive behavior and was redirected as needed.  Interval History:  No events overnight.  Was looking forward to having pancakes this morning.  Denied any change in his ongoing left-sided dense hemiplegia.  ROS: Constitutional: negative for chills and fevers, Respiratory: negative for cough, Cardiovascular: negative for chest pain and Gastrointestinal: negative for abdominal pain  Assessment & Plan:  Hypertension - starting to improve with regimen adjustment - continue lisinopril, amlodipine, hydralazine - added coreg 4/18 and will further adjust as needed  Intracranial hemorrhage with associated brain injury Mechanical falls, 4/7/22and 03/05/21. - Goal SBP <140/90  - Echo showed left EF 65 - 70%, Gr 1 DD - LDL is 52 - Speech evaluation  recommending dysphagia 2 dietwith thin liquid. - impulsive and multiple falls; continue restraints as needed - continue Zoloft and Depakote (d/c tramadol as this can lower seizure threshold)  NSVT - d/c tele - start MVI - s/p electrolyte replacement  Right anterior shoulder pain -No crepitus on exam -Suspect mild muscle strain as this is extremity patient uses to reposition self in bed post stroke -Continue heat and Voltaren gel  Physical Deconditioning - awaiting SNF  Hyperlipidemia Continue with statin.  Type 2 diabetes mellitus uncontrolled with hyperglycemia Hemoglobin A1 c is 6.4% Continue with Metformin. Continue with SSI.   Hyponatremia Likely secondary to poor oral intake. Thiazide diuretic has been discontinued.  Hypokalemia Replaced  Mild abdominal pain abd film is negative for obstruction  Mild elevation of liver enzymes [place] ABD ordered and is negative.   Old records reviewed in assessment of this patient  Antimicrobials: n/a  DVT prophylaxis: Place and maintain sequential compression device Start: 03/07/21 0801   Code Status:   Code Status: Full Code Family Communication:   Disposition Plan: Status is: Inpatient  Remains inpatient appropriate because:Inpatient level of care appropriate due to severity of illness   Dispo: The patient is from: Home              Anticipated d/c is to: SNF              Patient currently is medically stable to d/c.   Difficult to place patient Yes  Risk of unplanned readmission score: Unplanned Admission- [place] do not use: 18.48   Objective: Blood pressure 100/70, pulse 96, temperature 98.3 F (36.8 C), temperature source Oral, resp. rate 17, height 5\' 9"  (1.753 m), weight 73.2 kg, SpO2 100 %.  Examination: General appearance: alert, cooperative and no distress Head: Normocephalic, without obvious abnormality, atraumatic Eyes: EOMI Lungs: clear to auscultation bilaterally Heart: regular rate and  rhythm and S1, S2 normal Abdomen: normal findings: bowel sounds normal and soft, non-tender Extremities: no edema Skin: mobility and turgor normal Neurologic: Left-sided hemiplegia.  Sensation intact.  Follows commands.  Consultants:   Neurosurgery -signed off  Procedures:     Data Reviewed: I have personally reviewed following labs and imaging studies Results for orders placed or performed during the hospital encounter of 02/21/21 (from the past 24 hour(s))  Glucose, capillary     Status: Abnormal   Collection Time: 03/14/21  4:10 PM  Result Value Ref Range   Glucose-Capillary 124 (H) 70 - 99 mg/dL  Glucose, capillary     Status: Abnormal   Collection Time: 03/14/21  9:18 PM  Result Value Ref Range   Glucose-Capillary 103 (H) 70 - 99 mg/dL  Glucose, capillary     Status: Abnormal   Collection Time: 03/15/21  6:14 AM  Result Value Ref Range   Glucose-Capillary 125 (H) 70 - 99 mg/dL  Glucose, capillary     Status: Abnormal   Collection Time: 03/15/21 11:38 AM  Result Value Ref Range   Glucose-Capillary 158 (H) 70 - 99 mg/dL    No results found for this or any previous visit (from the past 240 hour(s)).   Radiology Studies: No results found. CT HEAD WO CONTRAST  Final Result    CT HEAD WO CONTRAST  Final Result    CT HEAD WO CONTRAST  Final Result    US Abdomen Complete  Final Result    DG Abd 2 Views  Final Result    MR BRAIN W WO CONTRAST  Final Result    CT HEAD WO CONTRAST  Final Result    CT ANGIO NECK CODE STROKE  Final Result  Addendum 1 of 1  ADDENDUM REPORT: 02/21/2021 18:37    ADDENDUM:  These results were called by telephone at the time of interpretation  on 02/21/2021 at 6:35 pm to provider [facility] , who verbally  acknowledged these results.    Additionally, given the presence of retained metallic fragments  within the right neck, a contrast-enhanced neck CT (rather than an  MRI) should be performed for further evaluation of the  described  left neck mass. This was also discussed with Dr. Donnetta Simpers  at 6:35 p.m. on 02/21/2021.      Electronically Signed    By: Kellie Simmering DO    On: 02/21/2021 18:37      Final    CT ANGIO HEAD CODE STROKE  Final Result  Addendum 1 of 1  ADDENDUM REPORT: 02/21/2021 18:37    ADDENDUM:  These results were called by telephone at the time of interpretation  on 02/21/2021 at 6:35 pm to provider [facility] , who verbally  acknowledged these results.    Additionally, given the presence of retained metallic fragments  within the right neck, a contrast-enhanced neck CT (rather than an  MRI) should be performed for further evaluation of the described  left neck mass. This was also discussed with Dr. Donnetta Simpers  at 6:35 p.m. on 02/21/2021.      Electronically Signed    By: Kellie Simmering DO    On: 02/21/2021 18:37      Final    CT HEAD CODE STROKE WO CONTRAST  Final Result    DG Swallowing Func-Speech Pathology    (  Results Pending)    Scheduled Meds: . amLODipine  10 mg Oral Daily  . atorvastatin  20 mg Oral Daily  . carvedilol  6.25 mg Oral BID WC  . diclofenac Sodium  4 g Topical QID  . feeding supplement  1 Container Oral TID BM  . hydrALAZINE  100 mg Oral Q8H  . insulin aspart  0-5 Units Subcutaneous QHS  . insulin aspart  0-9 Units Subcutaneous TID WC  . lisinopril  40 mg Oral Daily  . metFORMIN  500 mg Oral Q breakfast  . multivitamin with minerals  1 tablet Oral Daily  . pantoprazole  40 mg Oral QHS  . polyethylene glycol  17 g Oral Daily  . senna-docusate  1 tablet Oral BID  . sertraline  25 mg Oral Daily  . tiZANidine  2 mg Oral QHS  . valproic acid  250 mg Oral BID   PRN Meds: acetaminophen **OR** acetaminophen (TYLENOL) oral liquid 160 mg/5 mL **OR** acetaminophen, alum & mag hydroxide-simeth, hydrALAZINE, hydroxypropyl methylcellulose / hypromellose, labetalol, melatonin, ondansetron (ZOFRAN) IV Continuous Infusions:   LOS: 22  days    Dwyane Dee, MD [facility] 03/15/2021, 1:04 PM

## 2021-03-16 LAB — GLUCOSE, CAPILLARY
Glucose-Capillary: 95 mg/dL (ref 70–99)
Glucose-Capillary: 110 mg/dL — ABNORMAL HIGH (ref 70–99)
Glucose-Capillary: 97 mg/dL (ref 70–99)
Glucose-Capillary: 122 mg/dL — ABNORMAL HIGH (ref 70–99)

## 2021-03-16 NOTE — Plan of Care (Signed)
  Problem: Coping: Goal: Will verbalize positive feelings about self Outcome: Progressing Goal: Will identify appropriate support needs Outcome: Progressing

## 2021-03-16 NOTE — Progress Notes (Addendum)
CSW spoke with patient's sister Tye Maryland who states she has communicated with the chaplain regarding HCPOA - Tye Maryland still in agreement to be patient's HCPOA and was told that the patient can complete the paperwork and have it notarized at any time. CSW informed Tye Maryland that Medicaid application was submitted with the support from the provider letter.  Patient was denied at [facility] in [place].  Madilyn Fireman, MSW, LCSW Transitions of Care  Clinical Social Worker II [phone]

## 2021-03-16 NOTE — Progress Notes (Signed)
[facility] PROGRESS NOTE  Hansen Carino MEQ:[id number] DOB: 10/06/1961 DOA: 02/21/2021 PCP: Patient, No Pcp Per (Inactive)  Status: Remains inpatient appropriate because:Altered mental status, Unsafe d/c plan and Inpatient level of care appropriate due to severity of illness   Dispo: The patient is from: Home              Anticipated d/c is to: SNF              Patient currently is medically stable to d/c.               Barriers to DC: No insurance.  Medicaid has been filed and a letter was dictated to clarify patient's underlying medical status to help qualify for Medicaid and disability.  Sister in process of becoming patient's [facility] POA   Difficult to place patient Yes  4/15: FMLA paperwork completed, scanned into our system and returned by email to patient sister as requested  Level of care: Med-Surg  Code Status: Full Family Communication: 4/13 [name] [phone]. Requested my work email so she could send FMLA forms for pt. She stated she was not his POA and to her knowledge he did not have a designated POA. I told her LCSW would contact her this week to discuss guardianship process DVT prophylaxis: SCDs Vaccination status: Unknown  Foley catheter: Condom catheter  HPI: 60 year old gentleman prior history of hypertension, cocaine use, tobacco abuse admitted on 02/21/2021 with a code stroke was found to have intracranial hemorrhage in the right basal ganglia secondary to hypertension and cocaine use.  Patient was transferred from neurology service to hospitalist service on 02/26/2021. Therapy evaluations recommending SNF.  pt had a fall on 03/03/21, he underwent CT head showing increased size of the intraparenchymal hematoma with worsening leftward midline shift, now measuring 8 mm.Effaced right lateral ventricle, which may become entrapped and predisposes to hydrocephalus. He was transferred to ICU overnight, neuro surgery consulted and a repeat CT head ordered which  was essentially stable .  Pt had another fall in the room on 03/05/21, repeat CT head without contrast showed Unchanged appearance of right basal ganglia intraparenchymal hematoma with 7 mm of leftward midline shift.  Subjective: Sleeping.  Awakens easily.  States slept better last night after initiation of muscle relaxer.  Continues to have some right arm and shoulder discomfort as previous.  Objective: Vitals:   03/15/21 2337 03/16/21 0409  BP: (!) 95/59 109/72  Pulse: 64 66  Resp: 17 17  Temp: 98 F (36.7 C) 97.7 F (36.5 C)  SpO2: 96% 99%    Intake/Output Summary (Last 24 hours) at 03/16/2021 0752 Last data filed at 03/16/2021 0700 Gross per 24 hour  Intake 1800 ml  Output 1125 ml  Net 675 ml   Filed Weights   02/25/21 1357  Weight: 73.2 kg    Exam:  Constitutional: Awakens easily.  Pleasant.  In no acute distress. Cardiovascular: Normotensive.  No tachycardia.  Normal heart sounds Abdomen:   LBM 4/19, soft nontender.  Normal bowel sounds. Neurologic: CN 2-12 grossly intact. Sensation intact, DTR normal. Strength 5/5 on the right with a dense, insensate left hemiplegia involving both the upper and lower extremity.  Noticed increasing hypertonicity less so actually on the stroke side but increasing on right side due to muscle strain. Psychiatric: Aikins to voice.  Oriented x3.  Did poorly on recent SLUMS screening by speech therapy with a score of 15/30   Assessment/Plan: Acute problems: Intracranial hemorrhage with associated brain injury Echocardiogram  showed left ventricle ejection fraction 65 to 70% with grade 1 diastolic dysfunction. LDL 52. Speech evaluation recommending dysphagia 2 diet with thin liquid. Due to impulsivity secondary to brain injury low-dose Zoloft and Depakote initiated with significant improvement in behaviors. Cognitive eval per OT/SLP has demonstrated deficits (see below) SLP COGNITIVE EVAL 4/8: Overall, his speech-language-cognitive function  appears similar to assessment on 3/29 with exception of working memory. Severity of dysarthria was unchanged and is mild-mod in conversation. Expressive and receptive language is functional. On SLUMS assessment he scored a 15 out of possible 30 requiring cues to retrieve 3/5 on word recall, difficulty with mental calculations, accurate with 1 of 4 questions for auditory recall of paragraph. ST will continue treatment to increase independence. OT COGNITIVE EVAL 4/8:  Arousal/Alertness: Awake/alert Behavior During Therapy: Flat affect Overall Cognitive Status: Impaired/Different from baseline Area of Impairment: Attention;Memory;Following commands;Safety/judgement;Awareness;Problem solving Current Attention Level: Sustained (short time) Memory: Decreased short-term memory;Decreased recall of precautions Following Commands: Follows one step commands consistently Safety/Judgement: Decreased awareness of safety;Decreased awareness of deficits Awareness: Intellectual Problem Solving: Slow processing;Decreased initiation;Requires verbal cues;Requires tactile cues;Difficulty sequencing General Comments: Pt impulsive and requiring simple, direct cues. Follow one step commands with calm environement. At end of session, providsing questioning cues to have pt review session, pt abel to report what he did and why with Mod questioning cues    Mechanical fall on 03/03/21 and 03/05/21. A ct head without contrast was obtained, showed  increased size of the intraparenchymal hematoma with worsening leftward midline shift, now measuring 8 mm.Effaced right lateral ventricle, which may become entrapped and predisposes to hydrocephalus. He was transferred to ICU overnight, neuro surgery consulted and a repeat CT head ordered which was essentially stable .  Pt transferred back to the floor.  Bed side sitter ordered for safety.   Right anterior shoulder pain/progressive hypertonicity -No crepitus on exam -Suspect mild  muscle strain as this is extremity patient uses to reposition self in bed post stroke -Continue heat and Voltaren gel -Encouraged to also use RLE in addition to RUE when repositioning -Begin tizanidine 2 mg at bedtime-patient also having issues with insomnia.  If tolerates without sedation can add to daytime as well if continues to have issues with hypertonicity -May also need splints or other orthotic devices on affected side  Physical Deconditioning PT eval 4/19:    Pt intermittently lethargic today and reports that he was unable to sleep last night. Recommend L PRAFO as pt has not had return to L ankle. Worked with OT on bed mobility and maintaining midline in sitting as well as a WB'ing through L side and attempting to decrease activation of R side. Pt stood with mod A +2 and pivoted to R to recliner with max A +2 with facilitation of wh shifting and support to L knee. PT will continue to follow.    OT eval 4/19:   Returned for second session as pt noted to be leaning out of chair soiled with stool. Total A +2 for all aspects of pericare and UB/ LB ADLs from recliner.MAX A +2 to pivot back to bed towards R side. Pt very fatigued from being up in chair most of day. Pt would continue to benefit from skilled occupational therapy while admitted and after d/c to address the below listed limitations in order to improve overall functional mobility and facilitate independence with BADL participation. DC plan remains appropriate, will follow acutely per POC.  Ortho Devices Type of Ortho Device: Prafo boot/shoe Ortho Device/Splint  Location: LLE Ortho Device/Splint Interventions: Ordered,Application,Adjustment   Post Interventions Patient Tolerated: Well Instructions Provided: Care of device     Hypertension Not well controlled.  Increase hydralazine 50 mg TID.  in addition to Norvasc 10 mg, clonidine 0.1 mg daily, isosorbide 15 mg daily, lisinopril 10 mg  daily  Hyperlipidemia Continue with statin.   Type 2 diabetes mellitus uncontrolled with hyperglycemia Hemoglobin A1 c is 6.4. not on insuliin at home.  Current CBGs well-controlled over the past 24 hours with range of 91-180 Continue with Metformin. Continue with SSI.   Hyponatremia Much improved to 134.  Probably secondary to poor oral intake. Thiazide diuretic has been discontinued. Serum osmolality was slightly low at 274 with low normal 275. 4/11 no indication to continue fluid restriction therefore this was discontinued.     Other problems: Hypokalemia Replaced, repeat level wnl.   Mild abdominal pain: abd film is negative for obstruction.   Mild elevation of liver enzymes.  [place] ABD ordered and is negative.    Data Reviewed: Basic Metabolic Panel: Recent Labs  Lab 03/11/21 2236  K 3.5  MG 1.9   Liver Function Tests: No results for input(s): AST, ALT, ALKPHOS, BILITOT, PROT, ALBUMIN in the last 168 hours. No results for input(s): LIPASE, AMYLASE in the last 168 hours. No results for input(s): AMMONIA in the last 168 hours. CBC: No results for input(s): WBC, NEUTROABS, HGB, HCT, MCV, PLT in the last 168 hours. Cardiac Enzymes: No results for input(s): CKTOTAL, CKMB, CKMBINDEX, TROPONINI in the last 168 hours. BNP (last 3 results) No results for input(s): BNP in the last 8760 hours.  ProBNP (last 3 results) No results for input(s): PROBNP in the last 8760 hours.  CBG: Recent Labs  Lab 03/15/21 0614 03/15/21 1138 03/15/21 1522 03/15/21 2115 03/16/21 0612  GLUCAP 125* 158* 113* 107* 95    No results found for this or any previous visit (from the past 240 hour(s)).   Studies: No results found.  Scheduled Meds: . amLODipine  10 mg Oral Daily  . atorvastatin  20 mg Oral Daily  . carvedilol  6.25 mg Oral BID WC  . diclofenac Sodium  4 g Topical QID  . feeding supplement  1 Container Oral TID BM  . hydrALAZINE  100 mg Oral Q8H  . insulin  aspart  0-5 Units Subcutaneous QHS  . insulin aspart  0-9 Units Subcutaneous TID WC  . lisinopril  40 mg Oral Daily  . metFORMIN  500 mg Oral Q breakfast  . multivitamin with minerals  1 tablet Oral Daily  . pantoprazole  40 mg Oral QHS  . polyethylene glycol  17 g Oral Daily  . senna-docusate  1 tablet Oral BID  . sertraline  25 mg Oral Daily  . tiZANidine  2 mg Oral QHS  . valproic acid  250 mg Oral BID   Continuous Infusions:  Active Problems:   Intraparenchymal hemorrhage of brain (HCC)   Hypertension   Tobacco abuse   Hyponatremia   Leukocytosis   Controlled type 2 diabetes mellitus with hyperglycemia, without long-term current use of insulin ([place])   Brain injury with brief loss of consciousness ([place])   Acute pain of right shoulder   Consultants:  Neurology  Neurosurgery  Procedures:  Echocardiogram  Antibiotics: Anti-infectives (From admission, onward)   None       Time spent: 35 minutes    Erin Hearing ANP  [facility] 7 am - 330 pm/M-F for direct patient care and secure  chat Please refer to Amion for contact info 23  days

## 2021-03-17 ENCOUNTER — Inpatient Hospital Stay: Payer: Self-pay | Admitting: Physician Assistant

## 2021-03-17 LAB — GLUCOSE, CAPILLARY
Glucose-Capillary: 93 mg/dL (ref 70–99)
Glucose-Capillary: 136 mg/dL — ABNORMAL HIGH (ref 70–99)
Glucose-Capillary: 162 mg/dL — ABNORMAL HIGH (ref 70–99)
Glucose-Capillary: 197 mg/dL — ABNORMAL HIGH (ref 70–99)

## 2021-03-17 MED ORDER — SERTRALINE HCL 50 MG PO TABS
50.0000 mg | ORAL_TABLET | Freq: Every day | ORAL | Status: DC
  Administered 2021-03-18 – 2021-04-13 (×27): 50 mg via ORAL
  Filled 2021-03-17 (×27): qty 1

## 2021-03-17 NOTE — Progress Notes (Signed)
  Speech Language Pathology Treatment: Dysphagia  Patient Details Name: Chad Coleman MRN: [id number] DOB: 03-Mar-1961 Today's Date: 03/17/2021 Time: 0835-0900 SLP Time Calculation (min) (ACUTE ONLY): 25 min  Assessment / Plan / Recommendation Clinical Impression  Pt seen for skilled observation with Dysphagia 3/puree and thin via straw during session with min verbal/visual cues provided for attention to left side for lingual sweep and clearance of anterior spillage during consumption.  Pt using napkin more readily after min reminders for clearance of left facial area.  No overt s/s of aspiration noted during trial.  Continue current diet with swallowing precautions/strategies in place.  ST will f/u for cognition/diet tolerance while in acute setting.   HPI HPI: 60 y.o. male with a medical history significant for hypertension, tobacco use, noncompliance with antihypertensive medications, and cocaine abuse who presented to the ED via EMS as a Code Stroke for evaluation of acute onset of left-sided weakness, numbness, and left facial droop. Dx right basal ganglia ICH. Evening of 4/7 pt fell from bed with CT revealing Increased size of 5.8 x 3.6 cm right intraparenchymal hematoma  with worsening leftward midline shift and eval. ST f/u for cognition/diet tolerance      SLP Plan  Continue with current plan of care       Recommendations  Diet recommendations: Dysphagia 3 (mechanical soft);Thin liquid Liquids provided via: Straw Medication Administration: Whole meds with puree Supervision: Patient able to self feed;Staff to assist with self feeding;Intermittent supervision to cue for compensatory strategies Compensations: Lingual sweep for clearance of pocketing;Small sips/bites;Slow rate;Minimize environmental distractions;Follow solids with liquid                Oral Care Recommendations: Oral care BID Follow up Recommendations: Other (comment) (TBD) SLP Visit Diagnosis: Dysphagia,  oropharyngeal phase (R13.12);Cognitive communication deficit (K02.542) Plan: Continue with current plan of care                       Elvina Sidle, M.S., CCC-SLP 03/17/2021, 11:45 AM

## 2021-03-17 NOTE — Progress Notes (Signed)
[facility] PROGRESS NOTE  Chad Coleman JGO:[id number] DOB: 09-30-1961 DOA: 02/21/2021 PCP: Patient, No Pcp Per (Inactive)  Status: Remains inpatient appropriate because:Altered mental status, Unsafe d/c plan and Inpatient level of care appropriate due to severity of illness   Dispo: The patient is from: Home              Anticipated d/c is to: SNF              Patient currently is medically stable to d/c.               Barriers to DC: No insurance.  Medicaid has been filed and a letter was dictated to clarify patient's underlying medical status to help qualify for Medicaid and disability.  Sister in process of becoming patient's [facility] POA   Difficult to place patient Yes  4/15: FMLA paperwork completed, scanned into our system and returned by email to patient sister as requested  Level of care: Med-Surg  Code Status: Full Family Communication: 4/13 [name] [phone]. DVT prophylaxis: SCDs Vaccination status: Unknown  Foley catheter: Condom catheter  HPI: 60 year old gentleman prior history of hypertension, cocaine use, tobacco abuse admitted on 02/21/2021 with a code stroke was found to have intracranial hemorrhage in the right basal ganglia secondary to hypertension and cocaine use.  Patient was transferred from neurology service to hospitalist service on 02/26/2021. Therapy evaluations recommending SNF.  pt had a fall on 03/03/21, he underwent CT head showing increased size of the intraparenchymal hematoma with worsening leftward midline shift, now measuring 8 mm.Effaced right lateral ventricle, which may become entrapped and predisposes to hydrocephalus. He was transferred to ICU overnight, neuro surgery consulted and a repeat CT head ordered which was essentially stable .  Pt had another fall in the room on 03/05/21, repeat CT head without contrast showed Unchanged appearance of right basal ganglia intraparenchymal hematoma with 7 mm of leftward midline  shift.  Subjective: Awake.  Sitting up in bed eating.  Asked me to crush her graham crackers and place in his applesauce.  Also requested another milk.  In good spirits today.  No specific complaints.  No reports of insomnia.  Objective: Vitals:   03/16/21 2358 03/17/21 0431  BP: 103/67 108/67  Pulse: 63 69  Resp: 18 18  Temp: 98.3 F (36.8 C) 97.9 F (36.6 C)  SpO2: 95% 96%    Intake/Output Summary (Last 24 hours) at 03/17/2021 0802 Last data filed at 03/17/2021 0409 Gross per 24 hour  Intake --  Output 1300 ml  Net -1300 ml   Filed Weights   02/25/21 1357  Weight: 73.2 kg    Exam:  Constitutional: Alert, calm, no acute distress Cardiovascular: S1 S2, normotensive, regular pulse, no peripheral edema Abdomen:   LBM 4/20, eating well.  Normoactive bowel sounds Neurologic: CN 2-12 grossly intact. Sensation intact, DTR normal. Strength 5/5 on the right with a dense, insensate left hemiplegia involving both the upper and lower extremity.  Improved extremity hypertonicity. Psychiatric alert and oriented x3.  Did poorly on recent SLUMS screening by speech therapy with a score of 15/30   Assessment/Plan: Acute problems: Intracranial hemorrhage with associated brain injury Echocardiogram showed left ventricle ejection fraction 65 to 70% with grade 1 diastolic dysfunction. LDL 52. Speech evaluation recommending dysphagia 2 diet with thin liquid. Due to impulsivity secondary to brain injury low-dose Zoloft and Depakote initiated with significant improvement in behaviors.  4/21 we will go ahead and increase Zoloft to 50 mg daily Cognitive  eval per OT/SLP has demonstrated deficits (see below) SLP COGNITIVE EVAL 4/8: Overall, his speech-language-cognitive function appears similar to assessment on 3/29 with exception of working memory. Severity of dysarthria was unchanged and is mild-mod in conversation. Expressive and receptive language is functional. On SLUMS assessment he scored a  15 out of possible 30 requiring cues to retrieve 3/5 on word recall, difficulty with mental calculations, accurate with 1 of 4 questions for auditory recall of paragraph. ST will continue treatment to increase independence.  OT COGNITIVE EVAL 4/8:  Arousal/Alertness: Awake/alert Behavior During Therapy: Flat affect Overall Cognitive Status: Impaired/Different from baseline Area of Impairment: Attention;Memory;Following commands;Safety/judgement;Awareness;Problem solving Current Attention Level: Sustained (short time) Memory: Decreased short-term memory;Decreased recall of precautions Following Commands: Follows one step commands consistently Safety/Judgement: Decreased awareness of safety;Decreased awareness of deficits Awareness: Intellectual Problem Solving: Slow processing;Decreased initiation;Requires verbal cues;Requires tactile cues;Difficulty sequencing General Comments: Pt impulsive and requiring simple, direct cues. Follow one step commands with calm environement. At end of session, providsing questioning cues to have pt review session, pt abel to report what he did and why with Mod questioning cues  @@Continues  to have issues with impulsivity therefore requiring safety belt to prevent getting out of bed unassisted-see below regarding fall@@  Mechanical fall on 03/03/21 and 03/05/21. A ct head without contrast was obtained, showed  increased size of the intraparenchymal hematoma with worsening leftward midline shift, now measuring 8 mm.Effaced right lateral ventricle, which may become entrapped and predisposes to hydrocephalus. He was transferred to ICU overnight, neuro surgery consulted and a repeat CT head ordered which was essentially stable .  Pt transferred back to the floor.  Bed side sitter ordered for safety.   Right anterior shoulder pain/progressive hypertonicity -Suspect mild muscle strain as this is extremity patient uses to reposition self in bed post stroke -Continue  heat,Voltaren gel tizanidine noting improvement in symptoms  Physical Deconditioning PT eval 4/19:    Pt intermittently lethargic today and reports that he was unable to sleep last night. Recommend L PRAFO as pt has not had return to L ankle. Worked with OT on bed mobility and maintaining midline in sitting as well as a WB'ing through L side and attempting to decrease activation of R side. Pt stood with mod A +2 and pivoted to R to recliner with max A +2 with facilitation of wh shifting and support to L knee. PT will continue to follow.    OT eval 4/19:   Returned for second session as pt noted to be leaning out of chair soiled with stool. Total A +2 for all aspects of pericare and UB/ LB ADLs from recliner.MAX A +2 to pivot back to bed towards R side. Pt very fatigued from being up in chair most of day. Pt would continue to benefit from skilled occupational therapy while admitted and after d/c to address the below listed limitations in order to improve overall functional mobility and facilitate independence with BADL participation. DC plan remains appropriate, will follow acutely per POC.  Ortho Devices Type of Ortho Device: Prafo boot/shoe Ortho Device/Splint Location: LLE Ortho Device/Splint Interventions: Ordered,Application,Adjustment   Post Interventions Patient Tolerated: Well Instructions Provided: Care of device     Hypertension Not well controlled.  Increase hydralazine 50 mg TID.  in addition to Norvasc 10 mg, clonidine 0.1 mg daily, isosorbide 15 mg daily, lisinopril 10 mg daily  Hyperlipidemia Continue with statin.  Type 2 diabetes mellitus uncontrolled with hyperglycemia Hemoglobin A1 c is 6.4. not on insuliin at home.  Current CBGs well-controlled over the past 24 hours with range of 91-180 Continue with Metformin. Continue with SSI.   Hyponatremia Much improved to 134.  Probably secondary to poor oral intake. Thiazide diuretic has been  discontinued. Serum osmolality was slightly low at 274 with low normal 275. 4/11 no indication to continue fluid restriction therefore this was discontinued.     Other problems: Hypokalemia Replaced, repeat level wnl.   Mild abdominal pain: abd film is negative for obstruction.   Mild elevation of liver enzymes.  [place] ABD ordered and is negative.    Data Reviewed: Basic Metabolic Panel: Recent Labs  Lab 03/11/21 2236  K 3.5  MG 1.9   Liver Function Tests: No results for input(s): AST, ALT, ALKPHOS, BILITOT, PROT, ALBUMIN in the last 168 hours. No results for input(s): LIPASE, AMYLASE in the last 168 hours. No results for input(s): AMMONIA in the last 168 hours. CBC: No results for input(s): WBC, NEUTROABS, HGB, HCT, MCV, PLT in the last 168 hours. Cardiac Enzymes: No results for input(s): CKTOTAL, CKMB, CKMBINDEX, TROPONINI in the last 168 hours. BNP (last 3 results) No results for input(s): BNP in the last 8760 hours.  ProBNP (last 3 results) No results for input(s): PROBNP in the last 8760 hours.  CBG: Recent Labs  Lab 03/16/21 0612 03/16/21 1230 03/16/21 1625 03/16/21 2107 03/17/21 0606  GLUCAP 95 110* 97 122* 93    No results found for this or any previous visit (from the past 240 hour(s)).   Studies: No results found.  Scheduled Meds: . amLODipine  10 mg Oral Daily  . atorvastatin  20 mg Oral Daily  . carvedilol  6.25 mg Oral BID WC  . diclofenac Sodium  4 g Topical QID  . feeding supplement  1 Container Oral TID BM  . hydrALAZINE  100 mg Oral Q8H  . insulin aspart  0-5 Units Subcutaneous QHS  . insulin aspart  0-9 Units Subcutaneous TID WC  . lisinopril  40 mg Oral Daily  . metFORMIN  500 mg Oral Q breakfast  . multivitamin with minerals  1 tablet Oral Daily  . pantoprazole  40 mg Oral QHS  . polyethylene glycol  17 g Oral Daily  . senna-docusate  1 tablet Oral BID  . sertraline  25 mg Oral Daily  . tiZANidine  2 mg Oral QHS  . valproic  acid  250 mg Oral BID   Continuous Infusions:  Active Problems:   Intraparenchymal hemorrhage of brain (HCC)   Hypertension   Tobacco abuse   Hyponatremia   Leukocytosis   Controlled type 2 diabetes mellitus with hyperglycemia, without long-term current use of insulin ([place])   Brain injury with brief loss of consciousness ([place])   Acute pain of right shoulder   Consultants:  Neurology  Neurosurgery  Procedures:  Echocardiogram  Antibiotics: Anti-infectives (From admission, onward)   None       Time spent: 20 minutes    Erin Hearing ANP  [facility] 7 am - 330 pm/M-F for direct patient care and secure chat Please refer to Amion for contact info 24  days

## 2021-03-18 LAB — GLUCOSE, CAPILLARY
Glucose-Capillary: 100 mg/dL — ABNORMAL HIGH (ref 70–99)
Glucose-Capillary: 250 mg/dL — ABNORMAL HIGH (ref 70–99)
Glucose-Capillary: 105 mg/dL — ABNORMAL HIGH (ref 70–99)
Glucose-Capillary: 122 mg/dL — ABNORMAL HIGH (ref 70–99)

## 2021-03-18 NOTE — Plan of Care (Signed)
  Problem: Coping: Goal: Will verbalize positive feelings about self Outcome: Progressing Goal: Will identify appropriate support needs Outcome: Progressing   Problem: Health Behavior/Discharge Planning: Goal: Ability to manage health-related needs will improve Outcome: Progressing   Problem: Self-Care: Goal: Ability to participate in self-care as condition permits will improve Outcome: Progressing Goal: Verbalization of feelings and concerns over difficulty with self-care will improve Outcome: Progressing Goal: Ability to communicate needs accurately will improve Outcome: Progressing   Problem: Nutrition: Goal: Risk of aspiration will decrease Outcome: Progressing Goal: Dietary intake will improve Outcome: Progressing   Problem: Intracerebral Hemorrhage Tissue Perfusion: Goal: Complications of Intracerebral Hemorrhage will be minimized Outcome: Progressing   Problem: Safety: Goal: Non-violent Restraint(s) Outcome: Progressing   Problem: Education: Goal: Knowledge of General Education information will improve Description: Including pain rating scale, medication(s)/side effects and non-pharmacologic comfort measures Outcome: Progressing   Problem: Health Behavior/Discharge Planning: Goal: Ability to manage health-related needs will improve Outcome: Progressing   Problem: Clinical Measurements: Goal: Ability to maintain clinical measurements within normal limits will improve Outcome: Progressing Goal: Will remain free from infection Outcome: Progressing Goal: Diagnostic test results will improve Outcome: Progressing Goal: Respiratory complications will improve Outcome: Progressing Goal: Cardiovascular complication will be avoided Outcome: Progressing   Problem: Activity: Goal: Risk for activity intolerance will decrease Outcome: Progressing   Problem: Nutrition: Goal: Adequate nutrition will be maintained Outcome: Progressing   Problem: Coping: Goal: Level  of anxiety will decrease Outcome: Progressing   Problem: Elimination: Goal: Will not experience complications related to bowel motility Outcome: Progressing Goal: Will not experience complications related to urinary retention Outcome: Progressing   Problem: Pain Managment: Goal: General experience of comfort will improve Outcome: Progressing   Problem: Safety: Goal: Ability to remain free from injury will improve Outcome: Progressing   Problem: Skin Integrity: Goal: Risk for impaired skin integrity will decrease Outcome: Progressing   Problem: Education: Goal: Knowledge of disease or condition will improve Outcome: Progressing Goal: Knowledge of secondary prevention will improve Outcome: Progressing Goal: Knowledge of patient specific risk factors addressed and post discharge goals established will improve Outcome: Progressing Goal: Individualized Educational Video(s) Outcome: Progressing   Problem: Safety: Goal: Non-violent Restraint(s) Outcome: Progressing   

## 2021-03-18 NOTE — Progress Notes (Signed)
Occupational Therapy Treatment Patient Details Name: Chad Coleman MRN: [id number] DOB: 1961-11-19 Today's Date: 03/18/2021    History of present illness 60 yo male presents to [facility] on 3/28 with acute onset of L weakness, numbness, and L facial droop. CTH R BG ICH, suspect hypertensive.  Fall on 02/28/21. imaging showing increased in R intraparenchymal hematoma with worsening leftward midline shift on 4/7. PMH includes hypertension, tobacco use, noncompliance with antihypertensive medications, and cocaine abuse.   OT comments  Pt progressing towards established OT goals. Pt continues to present with high motivation to participate in therapy. Pt performing squat pivot to recliner with Max A +2. Pt performing sit<>stand at sink with Mod A +2 and blocking of L knee. Pt continues to present with left lateral lean but is able to correct posture with Min cues. Pt performing oral care at sink (seated) with Mod A. Continue to recommend dc to post-acute rehab. Will continue to follow acutely as admitted.    Follow Up Recommendations  SNF    Equipment Recommendations  None recommended by OT    Recommendations for Other Services PT consult;Rehab consult;Speech consult    Precautions / Restrictions Precautions Precautions: Fall Precaution Comments: Pusher, mild L inattention, impulsive Restrictions Weight Bearing Restrictions: No       Mobility Bed Mobility Overal bed mobility: Needs Assistance Bed Mobility: Supine to Sit     Supine to sit: Mod assist;+2 for physical assistance     General bed mobility comments: Pt requires handheld assist to come to sit at EOB. Requires assistance with LLE to bring over to edge of bed.    Transfers Overall transfer level: Needs assistance Equipment used: 2 person hand held assist Transfers: Sit to/from [facility] Sit to Stand: Mod assist;+2 physical assistance;From elevated surface Stand pivot transfers: Max assist;+2 physical  assistance       General transfer comment: Pt requires L knee blocking for stand pivot transfer as bilateral LE buckle.    Balance Overall balance assessment: Needs assistance Sitting-balance support: Feet supported Sitting balance-Leahy Scale: Poor   Postural control: Left lateral lean Standing balance support: Bilateral upper extremity supported;During functional activity Standing balance-Leahy Scale: Poor Standing balance comment: Pt requires max A +2 for dynamic standing activties. Pt uses B UE at sink and L knee block during standing weightshifts.                           ADL either performed or assessed with clinical judgement   ADL Overall ADL's : Needs assistance/impaired     Grooming: Oral care;Moderate assistance;Sitting Grooming Details (indicate cue type and reason): Mod A for hand over hand incorporating LUE. Pt able to manage tools with RUE. Demonstrating increased trunk control to lean forawrd and spit in sink without falling left             Lower Body Dressing: Maximal assistance;Bed level Lower Body Dressing Details (indicate cue type and reason): Pt donning R sock with assistance to assume firgure four in bed and then initate donning. Assist to don L sock Toilet Transfer: Maximal assistance;+2 for physical assistance;Squat-pivot (simulated to recliner) Toilet Transfer Details (indicate cue type and reason): Pt performing squat pivot with Max A +2. Blocking of LLE.         Functional mobility during ADLs: Moderate assistance;+2 for physical assistance (sit<>stand and squat pivot) General ADL Comments: Pt performing squat pivot to recliner and then mutiple sit<>Stand while at sink.  Vision       Perception     Praxis      Cognition Arousal/Alertness: Awake/alert Behavior During Therapy: WFL for tasks assessed/performed Overall Cognitive Status: Impaired/Different from baseline Area of Impairment:  Safety/judgement;Attention;Memory;Awareness;Problem solving                   Current Attention Level: Sustained Memory: Decreased short-term memory Following Commands: Follows one step commands with increased time Safety/Judgement: Decreased awareness of safety;Decreased awareness of deficits Awareness: Emergent Problem Solving: Slow processing;Requires verbal cues;Requires tactile cues General Comments: Pt reports he is pushing towards L, continues to require cues to adjust to midline.        Exercises Exercises: Other exercises Other Exercises Other Exercises: mini squats performed x5. UE support at sink. L knee blocked Other Exercises: Weight shifting L<>R. x10. L knee blocked Other Exercises: weight bearing through L elbow in sitting   Shoulder Instructions       General Comments Pt reportedly realizes he is shifted to the L when asked. Pt is able to correct with assistance but sustains midline when attentive to balance. Pt requires R UE support to hold trunk in midline.    Pertinent Vitals/ Pain       Pain Assessment: Faces Faces Pain Scale: Hurts a little bit Pain Location: L shoulder Pain Descriptors / Indicators: Grimacing;Discomfort Pain Intervention(s): Monitored during session;Limited activity within patient's tolerance;Repositioned  Home Living                                          Prior Functioning/Environment              Frequency  Min 2X/week        Progress Toward Goals  OT Goals(current goals can now be found in the care plan section)  Progress towards OT goals: Progressing toward goals  Acute Rehab OT Goals Patient Stated Goal: have some chips OT Goal Formulation: With patient Time For Goal Achievement: 03/25/21 Potential to Achieve Goals: Fair ADL Goals Pt Will Perform Grooming: with min assist;sitting Pt Will Perform Upper Body Bathing: with min assist;sitting Pt Will Transfer to Toilet: with min  assist;with +2 assist;bedside commode;stand pivot transfer Additional ADL Goal #1: Pt will perform bed mobility with Min A in preparation for ADLs Additional ADL Goal #2: Pt will tolerate sitting at EOB for 10 minutes with Min A in preparation for ADLs Additional ADL Goal #3: Pt will use Lt UE as a stabilizer with mod A and mod verbal cues  Plan Discharge plan remains appropriate;Frequency remains appropriate    Co-evaluation    PT/OT/SLP Co-Evaluation/Treatment: Yes Reason for Co-Treatment: For patient/therapist safety;To address functional/ADL transfers PT goals addressed during session: Mobility/safety with mobility;Strengthening/ROM;Balance OT goals addressed during session: ADL's and self-care      AM-PAC OT "6 Clicks" Daily Activity     Outcome Measure   Help from another person eating meals?: A Little Help from another person taking care of personal grooming?: A Lot Help from another person toileting, which includes using toliet, bedpan, or urinal?: A Lot Help from another person bathing (including washing, rinsing, drying)?: A Lot Help from another person to put on and taking off regular upper body clothing?: A Lot Help from another person to put on and taking off regular lower body clothing?: A Lot 6 Click Score: 13    End of Session    OT  Visit Diagnosis: Unsteadiness on feet (R26.81);Other abnormalities of gait and mobility (R26.89);Muscle weakness (generalized) (M62.81);Hemiplegia and hemiparesis Hemiplegia - Right/Left: Left Hemiplegia - dominant/non-dominant: Dominant Hemiplegia - caused by: Cerebral infarction   Activity Tolerance Patient tolerated treatment well   Patient Left in chair;with call bell/phone within reach;with chair alarm set;with restraints reapplied   Nurse Communication Mobility status        Time: 7544-9201 OT Time Calculation (min): 38 min  Charges: OT General Charges $OT Visit: 1 Visit OT Treatments $Self Care/Home Management :  23-37 mins  [place], OTR/L Acute Rehab Pager: [phone] Office: [place] 03/18/2021, 3:32 PM

## 2021-03-18 NOTE — Progress Notes (Signed)
[facility] PROGRESS NOTE  Chad Coleman [id number]:[id number] DOB: 08-Mar-1961 DOA: 02/21/2021 PCP: Patient, No Pcp Per (Inactive)  Status: Remains inpatient appropriate because:Altered mental status, Unsafe d/c plan and Inpatient level of care appropriate due to severity of illness   Dispo: The patient is from: Home              Anticipated d/c is to: SNF              Patient currently is medically stable to d/c.               Barriers to DC: No insurance.  Medicaid has been filed and a letter was dictated to clarify patient's underlying medical status to help qualify for Medicaid and disability.  Sister in process of becoming patient's [facility] POA   Difficult to place patient Yes  4/15: FMLA paperwork completed, scanned into our system and returned by email to patient sister as requested  Level of care: Med-Surg  Code Status: Full Family Communication: 4/13 [name] [phone]. DVT prophylaxis: SCDs Vaccination status: Unknown  Foley catheter: Condom catheter  HPI: 60 year old gentleman prior history of hypertension, cocaine use, tobacco abuse admitted on 02/21/2021 with a code stroke was found to have intracranial hemorrhage in the right basal ganglia secondary to hypertension and cocaine use.  Patient was transferred from neurology service to hospitalist service on 02/26/2021. Therapy evaluations recommending SNF.  pt had a fall on 03/03/21, he underwent CT head showing increased size of the intraparenchymal hematoma with worsening leftward midline shift, now measuring 8 mm.Effaced right lateral ventricle, which may become entrapped and predisposes to hydrocephalus. He was transferred to ICU overnight, neuro surgery consulted and a repeat CT head ordered which was essentially stable .  Pt had another fall in the room on 03/05/21, repeat CT head without contrast showed Unchanged appearance of right basal ganglia intraparenchymal hematoma with 7 mm of leftward midline  shift.  Subjective: Awake.  Position on left side.  Smiles appropriately.  Trying to go back to sleep after having eaten breakfast.  Objective: Vitals:   03/18/21 0330 03/18/21 0645  BP: 109/75 123/72  Pulse: (!) 59   Resp: 17   Temp: 98.7 F (37.1 C)   SpO2: 100%    No intake or output data in the 24 hours ending 03/18/21 0732 Filed Weights   02/25/21 1357  Weight: 73.2 kg    Exam:  Constitutional: Awake, calm, no acute distress.  Appears comfortable Cardiovascular: S1-S2, regular pulse, no peripheral edema Abdomen:   LBM 4/21, soft nontender.  Eats well.  Normoactive bowel sounds Neurologic: CN 2-12 grossly intact. Sensation intact, DTR normal. Strength 5/5 on the right with a dense, insensate left hemiplegia involving both the upper and lower extremity.  Improved extremity hypertonicity. Psychiatric alert and oriented x3.  At times lacks capacity in regards to understanding current physical limitations.  Pleasant affect.   Assessment/Plan: Acute problems: Intracranial hemorrhage with associated brain injury Echocardiogram showed left ventricle ejection fraction 65 to 70% with grade 1 diastolic dysfunction. LDL 52. Speech evaluation 4/21 with new recommendation for dysphagia 3/mechanical soft diet with thin liquid. Zoloft 50 mg daily for brain injury related impulsivity Cognitive eval per OT/SLP has demonstrated deficits (see below) SLP COGNITIVE EVAL 4/8: Overall, his speech-language-cognitive function appears similar to assessment on 3/29 with exception of working memory. Severity of dysarthria was unchanged and is mild-mod in conversation. Expressive and receptive language is functional. On SLUMS assessment he scored a 15 out of possible  30 requiring cues to retrieve 3/5 on word recall, difficulty with mental calculations, accurate with 1 of 4 questions for auditory recall of paragraph. ST will continue treatment to increase independence.  OT COGNITIVE EVAL 4/8:   Arousal/Alertness: Awake/alert Behavior During Therapy: Flat affect Overall Cognitive Status: Impaired/Different from baseline Area of Impairment: Attention;Memory;Following commands;Safety/judgement;Awareness;Problem solving Current Attention Level: Sustained (short time) Memory: Decreased short-term memory;Decreased recall of precautions Following Commands: Follows one step commands consistently Safety/Judgement: Decreased awareness of safety;Decreased awareness of deficits Awareness: Intellectual Problem Solving: Slow processing;Decreased initiation;Requires verbal cues;Requires tactile cues;Difficulty sequencing General Comments: Pt impulsive and requiring simple, direct cues. Follow one step commands with calm environement. At end of session, providsing questioning cues to have pt review session, pt abel to report what he did and why with Mod questioning cues  @@Continues  to have issues with impulsivity therefore requiring safety belt to prevent getting out of bed unassisted-see below regarding fall@@  Mechanical fall on 03/03/21 and 03/05/21. A ct head without contrast was obtained, showed  increased size of the intraparenchymal hematoma with worsening leftward midline shift, now measuring 8 mm.Effaced right lateral ventricle, which may become entrapped and predisposes to hydrocephalus. He was transferred to ICU overnight, neuro surgery consulted and a repeat CT head ordered which was essentially stable .  Pt transferred back to the floor.  Bed side sitter ordered for safety.   Right anterior shoulder pain/progressive hypertonicity -Suspect mild muscle strain as this is extremity patient uses to reposition self in bed post stroke -Continue heat,Voltaren gel tizanidine noting improvement in symptoms  Physical Deconditioning PT eval 4/19:    Pt intermittently lethargic today and reports that he was unable to sleep last night. Recommend L PRAFO as pt has not had return to L ankle. Worked  with OT on bed mobility and maintaining midline in sitting as well as a WB'ing through L side and attempting to decrease activation of R side. Pt stood with mod A +2 and pivoted to R to recliner with max A +2 with facilitation of wh shifting and support to L knee. PT will continue to follow.    OT eval 4/19:   Returned for second session as pt noted to be leaning out of chair soiled with stool. Total A +2 for all aspects of pericare and UB/ LB ADLs from recliner.MAX A +2 to pivot back to bed towards R side. Pt very fatigued from being up in chair most of day. Pt would continue to benefit from skilled occupational therapy while admitted and after d/c to address the below listed limitations in order to improve overall functional mobility and facilitate independence with BADL participation. DC plan remains appropriate, will follow acutely per POC.  Ortho Devices Type of Ortho Device: Prafo boot/shoe Ortho Device/Splint Location: LLE Ortho Device/Splint Interventions: Ordered,Application,Adjustment   Post Interventions Patient Tolerated: Well Instructions Provided: Care of device     Hypertension Not well controlled.  Increase hydralazine 50 mg TID.  in addition to Norvasc 10 mg, clonidine 0.1 mg daily, isosorbide 15 mg daily, lisinopril 10 mg daily  Hyperlipidemia Continue with statin.  Type 2 diabetes mellitus uncontrolled with hyperglycemia Hemoglobin A1 c is 6.4. not on insuliin at home.  Current CBGs well-controlled over the past 24 hours with range of 91-180 Continue with Metformin. Continue with SSI.   Hyponatremia Much improved to 134.  Probably secondary to poor oral intake. Thiazide diuretic has been discontinued. Serum osmolality was slightly low at 274 with low normal 275. 4/11 no indication to  continue fluid restriction therefore this was discontinued.     Other problems: Hypokalemia Replaced, repeat level wnl.   Mild abdominal pain: abd film is negative  for obstruction.   Mild elevation of liver enzymes.  [place] ABD ordered and is negative.    Data Reviewed: Basic Metabolic Panel: Recent Labs  Lab 03/11/21 2236  K 3.5  MG 1.9   Liver Function Tests: No results for input(s): AST, ALT, ALKPHOS, BILITOT, PROT, ALBUMIN in the last 168 hours. No results for input(s): LIPASE, AMYLASE in the last 168 hours. No results for input(s): AMMONIA in the last 168 hours. CBC: No results for input(s): WBC, NEUTROABS, HGB, HCT, MCV, PLT in the last 168 hours. Cardiac Enzymes: No results for input(s): CKTOTAL, CKMB, CKMBINDEX, TROPONINI in the last 168 hours. BNP (last 3 results) No results for input(s): BNP in the last 8760 hours.  ProBNP (last 3 results) No results for input(s): PROBNP in the last 8760 hours.  CBG: Recent Labs  Lab 03/17/21 0606 03/17/21 1132 03/17/21 1647 03/17/21 2114 03/18/21 0622  GLUCAP 93 136* 162* 197* 100*    No results found for this or any previous visit (from the past 240 hour(s)).   Studies: No results found.  Scheduled Meds: . amLODipine  10 mg Oral Daily  . atorvastatin  20 mg Oral Daily  . carvedilol  6.25 mg Oral BID WC  . diclofenac Sodium  4 g Topical QID  . feeding supplement  1 Container Oral TID BM  . hydrALAZINE  100 mg Oral Q8H  . insulin aspart  0-5 Units Subcutaneous QHS  . insulin aspart  0-9 Units Subcutaneous TID WC  . lisinopril  40 mg Oral Daily  . metFORMIN  500 mg Oral Q breakfast  . multivitamin with minerals  1 tablet Oral Daily  . pantoprazole  40 mg Oral QHS  . polyethylene glycol  17 g Oral Daily  . senna-docusate  1 tablet Oral BID  . sertraline  50 mg Oral Daily  . tiZANidine  2 mg Oral QHS  . valproic acid  250 mg Oral BID   Continuous Infusions:  Active Problems:   Intraparenchymal hemorrhage of brain (HCC)   Hypertension   Tobacco abuse   Hyponatremia   Leukocytosis   Controlled type 2 diabetes mellitus with hyperglycemia, without long-term current use of  insulin ([place])   Brain injury with brief loss of consciousness ([place])   Acute pain of right shoulder   Consultants:  Neurology  Neurosurgery  Procedures:  Echocardiogram  Antibiotics: Anti-infectives (From admission, onward)   None       Time spent: 20 minutes    Erin Hearing ANP  [facility] 7 am - 330 pm/M-F for direct patient care and secure chat Please refer to Amion for contact info 25  days

## 2021-03-18 NOTE — Progress Notes (Signed)
Physical Therapy Treatment Patient Details Name: Chad Coleman MRN: [id number] DOB: 05-10-1961 Today's Date: 03/18/2021    History of Present Illness 60 yo male presents to [facility] on 3/28 with acute onset of L weakness, numbness, and L facial droop. CTH R BG ICH, suspect hypertensive.  Fall on 02/28/21. imaging showing increased in R intraparenchymal hematoma with worsening leftward midline shift on 4/7. PMH includes hypertension, tobacco use, noncompliance with antihypertensive medications, and cocaine abuse.    PT Comments    Pt acknowledges that he is leaning laterally towards the L when asked. Pt continues to require physical assistance and cueing for bed mobility and OOB activities, including transfers. Pt requires physical assistance at L knee to prevent buckling and to maintain trunk in midline during standing and pregait activities. No signs of L pushing observed during this session. Pt tolerates pregait activities well and may benefit from initiation of gait training with railing in hallway. Pt will continue to benefit from acute PT to address deficits in strength, balance, endurance, and motor planning.     Follow Up Recommendations  Supervision/Assistance - 24 hour;SNF     Equipment Recommendations  None recommended by PT    Recommendations for Other Services       Precautions / Restrictions Precautions Precautions: Fall Precaution Comments: Pusher, mild L inattention, impulsive Restrictions Weight Bearing Restrictions: No    Mobility  Bed Mobility Overal bed mobility: Needs Assistance Bed Mobility: Supine to Sit     Supine to sit: Mod assist;+2 for physical assistance     General bed mobility comments: Pt requires handheld assist to come to sit at EOB. Requires assistance with LLE to bring over to edge of bed.    Transfers Overall transfer level: Needs assistance Equipment used: 2 person hand held assist Transfers: Sit to/from [facility] Sit to  Stand: Mod assist;+2 physical assistance;From elevated surface Stand pivot transfers: Max assist;+2 physical assistance       General transfer comment: Pt requires L knee blocking for stand pivot transfer as bilateral LE buckle.  Ambulation/Gait             General Gait Details: pregait activties performed with use of mirror to bring attention to midline during standing, pt cued to weightshift.   Stairs             Wheelchair Mobility    Modified Rankin (Stroke Patients Only) Modified Rankin (Stroke Patients Only) Pre-Morbid Rankin Score: No symptoms Modified Rankin: Severe disability     Balance Overall balance assessment: Needs assistance Sitting-balance support: Feet supported Sitting balance-Leahy Scale: Poor   Postural control: Left lateral lean Standing balance support: Bilateral upper extremity supported;During functional activity Standing balance-Leahy Scale: Poor Standing balance comment: Pt requires max A +2 for dynamic standing activties. Pt uses B UE at sink and L knee block during standing weightshifts.                            Cognition Arousal/Alertness: Awake/alert Behavior During Therapy: WFL for tasks assessed/performed Overall Cognitive Status: Impaired/Different from baseline Area of Impairment: Safety/judgement;Attention;Memory;Awareness;Problem solving                   Current Attention Level: Sustained Memory: Decreased short-term memory Following Commands: Follows one step commands with increased time Safety/Judgement: Decreased awareness of safety;Decreased awareness of deficits Awareness: Emergent Problem Solving: Slow processing;Requires verbal cues;Requires tactile cues General Comments: Pt reports he is pushing towards L, continues to  require cues to adjust to midline.      Exercises Other Exercises Other Exercises: mini squats performed x5    General Comments General comments (skin integrity, edema,  etc.): Pt reportedly realizes he is shifted to the L when asked. Pt is able to correct with assistance but sustains midline when attentive to balance. Pt requires R UE support to hold trunk in midline.      Pertinent Vitals/Pain Pain Assessment: Faces Faces Pain Scale: Hurts a little bit Pain Location: L shoulder Pain Descriptors / Indicators: Grimacing;Discomfort Pain Intervention(s): Monitored during session;Limited activity within patient's tolerance;Repositioned    Home Living                      Prior Function            PT Goals (current goals can now be found in the care plan section) Acute Rehab PT Goals Patient Stated Goal: have some chips Progress towards PT goals: Progressing toward goals    Frequency    Min 3X/week      PT Plan Current plan remains appropriate    Co-evaluation PT/OT/SLP Co-Evaluation/Treatment: Yes Reason for Co-Treatment: For patient/therapist safety;To address functional/ADL transfers PT goals addressed during session: Mobility/safety with mobility;Strengthening/ROM;Balance OT goals addressed during session: ADL's and self-care      AM-PAC PT "6 Clicks" Mobility   Outcome Measure  Help needed turning from your back to your side while in a flat bed without using bedrails?: A Lot Help needed moving from lying on your back to sitting on the side of a flat bed without using bedrails?: A Lot Help needed moving to and from a bed to a chair (including a wheelchair)?: A Lot Help needed standing up from a chair using your arms (e.g., wheelchair or bedside chair)?: A Lot Help needed to walk in hospital room?: Total Help needed climbing 3-5 steps with a railing? : Total 6 Click Score: 10    End of Session Equipment Utilized During Treatment: Gait belt Activity Tolerance: Patient tolerated treatment well;Patient limited by fatigue Patient left: in chair;with chair alarm set;with call bell/phone within reach;with restraints  reapplied Nurse Communication: Mobility status PT Visit Diagnosis: Hemiplegia and hemiparesis;Unsteadiness on feet (R26.81);Muscle weakness (generalized) (M62.81);Difficulty in walking, not elsewhere classified (R26.2);Other symptoms and signs involving the nervous system (R29.898) Hemiplegia - Right/Left: Left Hemiplegia - dominant/non-dominant: Dominant Hemiplegia - caused by: Nontraumatic intracerebral hemorrhage     Time: 1330-1405 PT Time Calculation (min) (ACUTE ONLY): 35 min  Charges:  $Neuromuscular Re-education: 8-22 mins                     Acute Rehab  Pager: [phone]    Garwin Brothers, SPT  03/18/2021, 3:29 PM

## 2021-03-19 LAB — CBC
HCT: 38.3 % — ABNORMAL LOW (ref 39.0–52.0)
Hemoglobin: 12.6 g/dL — ABNORMAL LOW (ref 13.0–17.0)
MCH: 28.7 pg (ref 26.0–34.0)
MCHC: 32.9 g/dL (ref 30.0–36.0)
MCV: 87.2 fL (ref 80.0–100.0)
Platelets: 319 10*3/uL (ref 150–400)
RBC: 4.39 MIL/uL (ref 4.22–5.81)
RDW: 12.1 % (ref 11.5–15.5)
WBC: 5.7 10*3/uL (ref 4.0–10.5)
nRBC: 0 % (ref 0.0–0.2)

## 2021-03-19 LAB — COMPREHENSIVE METABOLIC PANEL
ALT: 31 U/L (ref 0–44)
AST: 25 U/L (ref 15–41)
Albumin: 2.7 g/dL — ABNORMAL LOW (ref 3.5–5.0)
Alkaline Phosphatase: 38 U/L (ref 38–126)
Anion gap: 7 (ref 5–15)
BUN: 14 mg/dL (ref 6–20)
CO2: 28 mmol/L (ref 22–32)
Calcium: 8.9 mg/dL (ref 8.9–10.3)
Chloride: 98 mmol/L (ref 98–111)
Creatinine, Ser: 0.99 mg/dL (ref 0.61–1.24)
GFR, Estimated: >60 mL/min (ref >60)
Glucose, Bld: 99 mg/dL (ref 70–99)
Potassium: 3.9 mmol/L (ref 3.5–5.1)
Sodium: 133 mmol/L — ABNORMAL LOW (ref 135–145)
Total Bilirubin: 0.5 mg/dL (ref 0.3–1.2)
Total Protein: 5.6 g/dL — ABNORMAL LOW (ref 6.5–8.1)

## 2021-03-19 LAB — GLUCOSE, CAPILLARY
Glucose-Capillary: 98 mg/dL (ref 70–99)
Glucose-Capillary: 104 mg/dL — ABNORMAL HIGH (ref 70–99)
Glucose-Capillary: 104 mg/dL — ABNORMAL HIGH (ref 70–99)
Glucose-Capillary: 140 mg/dL — ABNORMAL HIGH (ref 70–99)

## 2021-03-19 NOTE — Progress Notes (Signed)
PROGRESS NOTE    Krzysztof Reichelt  VPX:[id number] DOB: 05/03/1961 DOA: 02/21/2021 PCP: Patient, No Pcp Per (Inactive)    Chief Complaint  Patient presents with  . Weakness    Brief Narrative:  60 year old gentleman prior history of hypertension, cocaine use, tobacco abuse admitted on 02/21/2021 with a code stroke was found to have intracranial hemorrhage in the right basal ganglia secondary to hypertension and cocaine use. Patient was transferred from neurology service to hospitalist service on 02/26/2021. Therapy evaluations recommending SNF. 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  No further work up initiated.  Echocardiogram reviewed showed grade 1 diastolic dysfunction.  SLP eval recommending dysphagia 3 diet.  Continue with zoloft 50 mg daily.    Mechanical fall on 4/7 and 4/9 Repeat CT head does not show worsening of the intra cranial bleed.     Hypertension:  Well controlled.  Resume coreg and norvasc and hydralazine.    DM type 2, without the long term use of insulin.  CBG (last 3)  Recent Labs    03/18/21 2120 03/19/21 0630 03/19/21 1205  GLUCAP 122* 98 104*   Resume SSI.  Continue with metformin    DVT prophylaxis: scd's Code Status: full code.  Family Communication: (none at bedside.  Disposition:   Status is: Inpatient  Remains inpatient appropriate because:Unsafe d/c plan   Dispo: The patient is from: Home              Anticipated d/c is to: SNF              Patient currently is not medically stable to d/c.   Difficult to place patient No       Level of  care: Med-Surg Consultants:   None.   Procedures: none.    Antimicrobials: none.    Subjective: No chest pain or sob.  Objective: Vitals:   03/19/21 0029 03/19/21 0357 03/19/21 0747 03/19/21 1155  BP: 115/79 94/65 133/88 (!) 141/90  Pulse: 62 (!) 59 65 61  Resp: 18 18 18 14   Temp: 97.9 F (36.6 C) 98.6 F (37 C) 98.2 F (36.8 C) 98.2 F (36.8 C)  TempSrc: Oral Oral Oral Oral  SpO2: 99% 98% 98% 99%  Weight:      Height:       No intake or output data in the 24 hours ending 03/19/21 1314 Filed Weights   02/25/21 1357  Weight: 73.2 kg    Examination:  General exam: Appears calm and comfortable  Respiratory system: Clear to auscultation. Respiratory effort normal. Cardiovascular system: S1 & S2 heard, RRR. No JVD,. No pedal edema. Gastrointestinal system: Abdomen is nondistended, soft and nontender.. Normal bowel sounds heard. Central nervous system: Alert and able to answer simple questions.  Extremities: Symmetric 5 x 5 power. Skin: No rashes, lesions or ulcers Psychiatry:  Mood & affect appropriate.     Data Reviewed: I have personally reviewed following labs and imaging studies  CBC: Recent Labs  Lab 03/19/21 0252  WBC 5.7  HGB 12.6*  HCT 38.3*  MCV 87.2  PLT 462    Basic Metabolic Panel: Recent Labs  Lab 03/19/21 0252  NA 133*  K 3.9  CL 98  CO2 28  GLUCOSE 99  BUN 14  CREATININE 0.99  CALCIUM 8.9    GFR: Estimated Creatinine Clearance: 80.3 mL/min (by C-G formula based on SCr of 0.99 mg/dL).  Liver Function Tests: Recent Labs  Lab 03/19/21 0252  AST 25  ALT 31  ALKPHOS 38  BILITOT 0.5  PROT 5.6*  ALBUMIN 2.7*    CBG: Recent Labs  Lab 03/18/21 1142 03/18/21 1630 03/18/21 2120 03/19/21 0630 03/19/21 1205  GLUCAP 250* 105* 122* 98 104*     No results found for this or any previous visit (from the past 240 hour(s)).       Radiology Studies: No results found.      Scheduled Meds: . amLODipine  10 mg  Oral Daily  . atorvastatin  20 mg Oral Daily  . carvedilol  6.25 mg Oral BID WC  . diclofenac Sodium  4 g Topical QID  . feeding supplement  1 Container Oral TID BM  . hydrALAZINE  100 mg Oral Q8H  . insulin aspart  0-5 Units Subcutaneous QHS  . insulin aspart  0-9 Units Subcutaneous TID WC  . lisinopril  40 mg Oral Daily  . metFORMIN  500 mg Oral Q breakfast  . multivitamin with minerals  1 tablet Oral Daily  . pantoprazole  40 mg Oral QHS  . polyethylene glycol  17 g Oral Daily  . senna-docusate  1 tablet Oral BID  . sertraline  50 mg Oral Daily  . tiZANidine  2 mg Oral QHS  . valproic acid  250 mg Oral BID   Continuous Infusions:   LOS: 26 days        Hosie Poisson, MD [facility]   To contact the attending provider between 7A-7P or the covering provider during after hours 7P-7A, please log into the web site [email or web address] and access using universal [place] password for that web site. If you do not have the password, please call the hospital operator.  03/19/2021, 1:14 PM

## 2021-03-19 NOTE — Plan of Care (Signed)
  Problem: Self-Care: Goal: Ability to communicate needs accurately will improve Outcome: Progressing   Problem: Nutrition: Goal: Risk of aspiration will decrease Outcome: Progressing   Problem: Clinical Measurements: Goal: Will remain free from infection Outcome: Progressing Goal: Respiratory complications will improve Outcome: Progressing Goal: Cardiovascular complication will be avoided Outcome: Progressing

## 2021-03-20 LAB — GLUCOSE, CAPILLARY
Glucose-Capillary: 103 mg/dL — ABNORMAL HIGH (ref 70–99)
Glucose-Capillary: 101 mg/dL — ABNORMAL HIGH (ref 70–99)
Glucose-Capillary: 115 mg/dL — ABNORMAL HIGH (ref 70–99)
Glucose-Capillary: 112 mg/dL — ABNORMAL HIGH (ref 70–99)

## 2021-03-20 MED ORDER — ENSURE ENLIVE PO LIQD: 237.0000 mL | Freq: Two times a day (BID) | ORAL | Status: DC

## 2021-03-20 MED ORDER — ENSURE ENLIVE PO LIQD
237.0000 mL | Freq: Three times a day (TID) | ORAL | Status: DC
  Administered 2021-03-20 – 2021-04-13 (×64): 237 mL via ORAL
  Filled 2021-03-20 (×5): qty 237

## 2021-03-20 NOTE — Plan of Care (Signed)
  Problem: Coping: Goal: Will verbalize positive feelings about self Outcome: Progressing Goal: Will identify appropriate support needs Outcome: Progressing   Problem: Health Behavior/Discharge Planning: Goal: Ability to manage health-related needs will improve Outcome: Progressing   Problem: Self-Care: Goal: Ability to participate in self-care as condition permits will improve Outcome: Progressing Goal: Verbalization of feelings and concerns over difficulty with self-care will improve Outcome: Progressing Goal: Ability to communicate needs accurately will improve Outcome: Progressing   Problem: Nutrition: Goal: Risk of aspiration will decrease Outcome: Progressing Goal: Dietary intake will improve Outcome: Progressing   Problem: Intracerebral Hemorrhage Tissue Perfusion: Goal: Complications of Intracerebral Hemorrhage will be minimized Outcome: Progressing   Problem: Education: Goal: Knowledge of General Education information will improve Description: Including pain rating scale, medication(s)/side effects and non-pharmacologic comfort measures Outcome: Progressing   Problem: Health Behavior/Discharge Planning: Goal: Ability to manage health-related needs will improve Outcome: Progressing   Problem: Clinical Measurements: Goal: Ability to maintain clinical measurements within normal limits will improve Outcome: Progressing Goal: Will remain free from infection Outcome: Progressing Goal: Diagnostic test results will improve Outcome: Progressing Goal: Respiratory complications will improve Outcome: Progressing Goal: Cardiovascular complication will be avoided Outcome: Progressing   Problem: Activity: Goal: Risk for activity intolerance will decrease Outcome: Progressing   Problem: Nutrition: Goal: Adequate nutrition will be maintained Outcome: Progressing   Problem: Coping: Goal: Level of anxiety will decrease Outcome: Progressing   Problem:  Elimination: Goal: Will not experience complications related to bowel motility Outcome: Progressing Goal: Will not experience complications related to urinary retention Outcome: Progressing   Problem: Pain Managment: Goal: General experience of comfort will improve Outcome: Progressing   Problem: Safety: Goal: Ability to remain free from injury will improve Outcome: Progressing   Problem: Skin Integrity: Goal: Risk for impaired skin integrity will decrease Outcome: Progressing   Problem: Education: Goal: Knowledge of disease or condition will improve Outcome: Progressing Goal: Knowledge of secondary prevention will improve Outcome: Progressing Goal: Knowledge of patient specific risk factors addressed and post discharge goals established will improve Outcome: Progressing Goal: Individualized Educational Video(s) Outcome: Progressing

## 2021-03-20 NOTE — Progress Notes (Signed)
PROGRESS NOTE    Chad Coleman  SWN:[id number] DOB: 22-Aug-1961 DOA: 02/21/2021 PCP: Patient, No Pcp Per (Inactive)    Chief Complaint  Patient presents with  . Weakness    Brief Narrative:  60 year old gentleman prior history of hypertension, cocaine use, tobacco abuse admitted on 02/21/2021 with a code stroke was found to have intracranial hemorrhage in the right basal ganglia secondary to hypertension and cocaine use. Patient was transferred from neurology service to hospitalist service on 02/26/2021. Therapy evaluations recommending SNF. Pt seen and examined at bedside.  He is requesting potato chips.  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  No further work up initiated.  Echocardiogram reviewed showed grade 1 diastolic dysfunction.  SLP eval recommending dysphagia 3 diet.  Continue with zoloft 50 mg daily.  Pt alert and oriented, no new complaints.    Mechanical fall on 4/7 and 4/9 Repeat CT head does not show worsening of the intra cranial bleed.     Hypertension:  Well controlled.  Resume coreg and norvasc and hydralazine. No change in meds.    Hyponatremia:  - sodium at 133.     DM type 2, without the long term use of insulin.  CBG (last 3)  Recent Labs    03/19/21 2136 03/20/21 0756 03/20/21 1239  GLUCAP 140* 103* 101*   Resume SSI.  Continue with metformin    DVT prophylaxis: scd's Code Status: full code.  Family Communication: (none at bedside.  Disposition:   Status is: Inpatient  Remains inpatient appropriate because:Unsafe d/c plan   Dispo: The patient  is from: Home              Anticipated d/c is to: SNF              Patient currently is not medically stable to d/c.   Difficult to place patient No       Level of care: Med-Surg Consultants:   None.   Procedures: none.    Antimicrobials: none.    Subjective: No chest pain or sob.  Objective: Vitals:   03/20/21 0752 03/20/21 1009 03/20/21 1149 03/20/21 1413  BP: 122/76  121/79 127/84  Pulse: 64 68 63   Resp: 18  16   Temp: 98.3 F (36.8 C)  98.6 F (37 C)   TempSrc: Oral  Axillary   SpO2: 98%  98%   Weight:      Height:        Intake/Output Summary (Last 24 hours) at 03/20/2021 1430 Last data filed at 03/20/2021 1125 Gross per 24 hour  Intake 150 ml  Output 1300 ml  Net -1150 ml   Filed Weights   02/25/21 1357  Weight: 73.2 kg    Examination:  General exam: alert and comfortable.  Respiratory system: air entry fair, no wheezing or rhonchi.  Cardiovascular system: S1 & S2 heard, RRR no JVD.  Gastrointestinal system: Abdomen is soft, non tender non distended bowel sounds good. Central nervous system: alert and oriented non focal Extremities: no pedal edema.  Skin: No rashes, Psychiatry:  Mood is appropriate.     Data Reviewed: I have  personally reviewed following labs and imaging studies  CBC: Recent Labs  Lab 03/19/21 0252  WBC 5.7  HGB 12.6*  HCT 38.3*  MCV 87.2  PLT 016    Basic Metabolic Panel: Recent Labs  Lab 03/19/21 0252  NA 133*  K 3.9  CL 98  CO2 28  GLUCOSE 99  BUN 14  CREATININE 0.99  CALCIUM 8.9    GFR: Estimated Creatinine Clearance: 80.3 mL/min (by C-G formula based on SCr of 0.99 mg/dL).  Liver Function Tests: Recent Labs  Lab 03/19/21 0252  AST 25  ALT 31  ALKPHOS 38  BILITOT 0.5  PROT 5.6*  ALBUMIN 2.7*    CBG: Recent Labs  Lab 03/19/21 1205 03/19/21 1617 03/19/21 2136 03/20/21 0756 03/20/21 1239  GLUCAP 104* 104* 140* 103* 101*     No results found for this or any previous visit (from  the past 240 hour(s)).       Radiology Studies: No results found.      Scheduled Meds: . amLODipine  10 mg Oral Daily  . atorvastatin  20 mg Oral Daily  . carvedilol  6.25 mg Oral BID WC  . diclofenac Sodium  4 g Topical QID  . feeding supplement  237 mL Oral TID BM  . hydrALAZINE  100 mg Oral Q8H  . insulin aspart  0-5 Units Subcutaneous QHS  . insulin aspart  0-9 Units Subcutaneous TID WC  . lisinopril  40 mg Oral Daily  . metFORMIN  500 mg Oral Q breakfast  . multivitamin with minerals  1 tablet Oral Daily  . pantoprazole  40 mg Oral QHS  . polyethylene glycol  17 g Oral Daily  . senna-docusate  1 tablet Oral BID  . sertraline  50 mg Oral Daily  . tiZANidine  2 mg Oral QHS  . valproic acid  250 mg Oral BID   Continuous Infusions:   LOS: 27 days        Hosie Poisson, MD [facility]   To contact the attending provider between 7A-7P or the covering provider during after hours 7P-7A, please log into the web site [email or web address] and access using universal [place] password for that web site. If you do not have the password, please call the hospital operator.  03/20/2021, 2:30 PM

## 2021-03-20 NOTE — Plan of Care (Signed)
  Problem: Coping: Goal: Will verbalize positive feelings about self Outcome: Progressing Goal: Will identify appropriate support needs Outcome: Progressing   Problem: Health Behavior/Discharge Planning: Goal: Ability to manage health-related needs will improve Outcome: Progressing   Problem: Self-Care: Goal: Ability to participate in self-care as condition permits will improve Outcome: Progressing Goal: Verbalization of feelings and concerns over difficulty with self-care will improve Outcome: Progressing Goal: Ability to communicate needs accurately will improve Outcome: Progressing   Problem: Nutrition: Goal: Risk of aspiration will decrease Outcome: Progressing Goal: Dietary intake will improve Outcome: Progressing   Problem: Intracerebral Hemorrhage Tissue Perfusion: Goal: Complications of Intracerebral Hemorrhage will be minimized Outcome: Progressing   Problem: Safety: Goal: Non-violent Restraint(s) Outcome: Progressing   Problem: Education: Goal: Knowledge of General Education information will improve Description: Including pain rating scale, medication(s)/side effects and non-pharmacologic comfort measures Outcome: Progressing   Problem: Health Behavior/Discharge Planning: Goal: Ability to manage health-related needs will improve Outcome: Progressing   Problem: Clinical Measurements: Goal: Ability to maintain clinical measurements within normal limits will improve Outcome: Progressing Goal: Will remain free from infection Outcome: Progressing Goal: Diagnostic test results will improve Outcome: Progressing Goal: Respiratory complications will improve Outcome: Progressing Goal: Cardiovascular complication will be avoided Outcome: Progressing   Problem: Activity: Goal: Risk for activity intolerance will decrease Outcome: Progressing   Problem: Nutrition: Goal: Adequate nutrition will be maintained Outcome: Progressing   Problem: Coping: Goal: Level  of anxiety will decrease Outcome: Progressing   Problem: Elimination: Goal: Will not experience complications related to bowel motility Outcome: Progressing Goal: Will not experience complications related to urinary retention Outcome: Progressing   Problem: Pain Managment: Goal: General experience of comfort will improve Outcome: Progressing   Problem: Safety: Goal: Ability to remain free from injury will improve Outcome: Progressing   Problem: Skin Integrity: Goal: Risk for impaired skin integrity will decrease Outcome: Progressing   Problem: Education: Goal: Knowledge of disease or condition will improve Outcome: Progressing Goal: Knowledge of secondary prevention will improve Outcome: Progressing Goal: Knowledge of patient specific risk factors addressed and post discharge goals established will improve Outcome: Progressing Goal: Individualized Educational Video(s) Outcome: Progressing   Problem: Safety: Goal: Non-violent Restraint(s) Outcome: Progressing   

## 2021-03-21 LAB — GLUCOSE, CAPILLARY
Glucose-Capillary: 97 mg/dL (ref 70–99)
Glucose-Capillary: 93 mg/dL (ref 70–99)
Glucose-Capillary: 97 mg/dL (ref 70–99)
Glucose-Capillary: 117 mg/dL — ABNORMAL HIGH (ref 70–99)

## 2021-03-21 NOTE — Progress Notes (Signed)
Physical Therapy Treatment Patient Details Name: Chad Coleman MRN: [id number] DOB: 30-Apr-1961 Today's Date: 03/21/2021    History of Present Illness 60 yo male presents to [facility] on 3/28 with acute onset of L weakness, numbness, and L facial droop. CTH R BG ICH, suspect hypertensive.  Fall on 02/28/21. imaging showing increased in R intraparenchymal hematoma with worsening leftward midline shift on 4/7. PMH includes hypertension, tobacco use, noncompliance with antihypertensive medications, and cocaine abuse.    PT Comments    Pt making slow steady progress. Still with significant deficits in strength, balance, and cognition. Requires 2 person assist for basic mobility and needs continued focus on balance and stability before he will see significant progress toward gait and transfers.    Follow Up Recommendations  Supervision/Assistance - 24 hour;SNF     Equipment Recommendations  Wheelchair (measurements PT);Wheelchair cushion (measurements PT)    Recommendations for Other Services       Precautions / Restrictions Precautions Precautions: Fall Precaution Comments: Pt with 2 in hospital falls. Pusher, mild L inattention, impulsive    Mobility  Bed Mobility Overal bed mobility: Needs Assistance Bed Mobility: Supine to Sit     Supine to sit: Mod assist;+2 for physical assistance     General bed mobility comments: Assist to bring LLE off of bed, elevate trunk into sitting and bring hips to EOB    Transfers Overall transfer level: Needs assistance Equipment used: 2 person hand held assist Transfers: Sit to/from [employer] Transfers Sit to Stand: Mod assist;+2 physical assistance;From elevated surface;Max assist Stand pivot transfers: Max assist;+2 physical assistance       General transfer comment: Assist to bring hips up and for balance. Blocking of lt knee. Bed to chair with small pivotal step with RLE and lt knee blocked. Standing from chair with use of wall rail  with max assist from low seat and difficulty keeping lt knee blocked.  Ambulation/Gait             General Gait Details: Attempted pregait using wall rail in hallway with 1 person mod/max assist but pt unable to gain adequate stability to perform   Stairs             Wheelchair Mobility    Modified Rankin (Stroke Patients Only) Modified Rankin (Stroke Patients Only) Pre-Morbid Rankin Score: No symptoms Modified Rankin: Severe disability     Balance Overall balance assessment: Needs assistance Sitting-balance support: Feet supported Sitting balance-Leahy Scale: Poor Sitting balance - Comments: min to mod assist for sitting EOB Postural control: Left lateral lean Standing balance support: Bilateral upper extremity supported;During functional activity Standing balance-Leahy Scale: Zero Standing balance comment: +2 mod to max for static standing                            Cognition Arousal/Alertness: Awake/alert Behavior During Therapy: WFL for tasks assessed/performed Overall Cognitive Status: Impaired/Different from baseline Area of Impairment: Safety/judgement;Attention;Memory;Awareness;Problem solving                   Current Attention Level: Sustained Memory: Decreased short-term memory Following Commands: Follows one step commands with increased time Safety/Judgement: Decreased awareness of safety;Decreased awareness of deficits Awareness: Emergent Problem Solving: Slow processing;Requires verbal cues;Requires tactile cues        Exercises      General Comments        Pertinent Vitals/Pain Pain Assessment: Faces Faces Pain Scale: Hurts a little bit Pain Location: L shoulder  Pain Descriptors / Indicators: Grimacing;Discomfort Pain Intervention(s): Limited activity within patient's tolerance    Home Living                      Prior Function            PT Goals (current goals can now be found in the care plan  section) Progress towards PT goals: Progressing toward goals    Frequency    Min 3X/week      PT Plan Current plan remains appropriate    Co-evaluation              AM-PAC PT "6 Clicks" Mobility   Outcome Measure  Help needed turning from your back to your side while in a flat bed without using bedrails?: A Lot Help needed moving from lying on your back to sitting on the side of a flat bed without using bedrails?: A Lot Help needed moving to and from a bed to a chair (including a wheelchair)?: A Lot Help needed standing up from a chair using your arms (e.g., wheelchair or bedside chair)?: A Lot Help needed to walk in hospital room?: Total Help needed climbing 3-5 steps with a railing? : Total 6 Click Score: 10    End of Session Equipment Utilized During Treatment: Gait belt Activity Tolerance: Patient tolerated treatment well Patient left: in chair;with chair alarm set;with call bell/phone within reach Nurse Communication: Mobility status;Need for lift equipment PT Visit Diagnosis: Hemiplegia and hemiparesis;Unsteadiness on feet (R26.81);Muscle weakness (generalized) (M62.81);Difficulty in walking, not elsewhere classified (R26.2);Other symptoms and signs involving the nervous system (R29.898) Hemiplegia - Right/Left: Left Hemiplegia - dominant/non-dominant: Dominant Hemiplegia - caused by: Nontraumatic intracerebral hemorrhage     Time: 1349-1411 PT Time Calculation (min) (ACUTE ONLY): 22 min  Charges:  $Therapeutic Activity: 8-22 mins                     [place] Pager [phone] Office [place] 03/21/2021, 6:13 PM

## 2021-03-21 NOTE — Progress Notes (Signed)
[facility] PROGRESS NOTE  Chad Coleman OBS:[id number] DOB: [birth year] DOA: 02/21/2021 PCP: Patient, No Pcp Per (Inactive)  Status: Remains inpatient appropriate because:Altered mental status, Unsafe d/c plan and Inpatient level of care appropriate due to severity of illness   Dispo: The patient is from: Home              Anticipated d/c is to: SNF              Patient currently is medically stable to d/c.               Barriers to DC: No insurance.  Medicaid has been filed and a letter was dictated to clarify patient's underlying medical status to help qualify for Medicaid and disability.  Sister in process of becoming patient's [facility] POA   Difficult to place patient Yes  4/15: FMLA paperwork completed, scanned into our system and returned by email to patient sister as requested  Level of care: Med-Surg  Code Status: Full Family Communication: 4/13 [name] [phone]. DVT prophylaxis: SCDs Vaccination status: Unknown  Foley catheter: Condom catheter  HPI: 60 year old gentleman prior history of hypertension, cocaine use, tobacco abuse admitted on 02/21/2021 with a code stroke was found to have intracranial hemorrhage in the right basal ganglia secondary to hypertension and cocaine use.  Patient was transferred from neurology service to hospitalist service on 02/26/2021. Therapy evaluations recommending SNF.  pt had a fall on 03/03/21, he underwent CT head showing increased size of the intraparenchymal hematoma with worsening leftward midline shift, now measuring 8 mm.Effaced right lateral ventricle, which may become entrapped and predisposes to hydrocephalus. He was transferred to ICU overnight, neuro surgery consulted and a repeat CT head ordered which was essentially stable .  Pt had another fall in the room on 03/05/21, repeat CT head without contrast showed Unchanged appearance of right basal ganglia intraparenchymal hematoma with 7 mm of leftward midline  shift.  Subjective: Awake.  States he has not had breakfast although does have food on his gown.  Continues to report right shoulder pain.  Objective: Vitals:   03/21/21 0100 03/21/21 0400  BP: 98/64 108/71  Pulse: 66 71  Resp: 16 18  Temp: 98.6 F (37 C) 99.4 F (37.4 C)  SpO2: 100% 98%    Intake/Output Summary (Last 24 hours) at 03/21/2021 0736 Last data filed at 03/21/2021 0400 Gross per 24 hour  Intake 630 ml  Output 650 ml  Net -20 ml   Filed Weights   02/25/21 1357  Weight: 73.2 kg    Exam:  Constitutional: Alert, calm, no acute distress, appears comfortable Cardiovascular: Normotensive, heart sounds S1-S2, regular pulse, no peripheral edema. Abdomen:   LBM 4/21, soft nontender with normoactive bowel sounds.  Eating well. Neurologic: CN 2-12 grossly intact. Sensation intact, DTR normal. Strength 5/5 on the right with a dense, insensate left hemiplegia involving both the upper and lower extremity.  Improved extremity hypertonicity. Psychiatric alert and oriented times name place and year.  Continues to have some short-term memory deficits.   Assessment/Plan: Acute problems: Intracranial hemorrhage with associated brain injury Echocardiogram showed left ventricle ejection fraction 65 to 70% with grade 1 diastolic dysfunction. LDL 52. Speech evaluation 4/21 with new recommendation for dysphagia 3/mechanical soft diet with thin liquid. Zoloft 50 mg daily for brain injury related impulsivity Cognitive eval per OT/SLP has demonstrated deficits (see below) SLP COGNITIVE EVAL 4/8: Overall, his speech-language-cognitive function appears similar to assessment on 3/29 with exception of working memory. Severity of  dysarthria was unchanged and is mild-mod in conversation. Expressive and receptive language is functional. On SLUMS assessment he scored a 15 out of possible 30 requiring cues to retrieve 3/5 on word recall, difficulty with mental calculations, accurate with 1 of 4  questions for auditory recall of paragraph. ST will continue treatment to increase independence.  OT COGNITIVE EVAL 4/8:  Arousal/Alertness: Awake/alert Behavior During Therapy: Flat affect Overall Cognitive Status: Impaired/Different from baseline Area of Impairment: Attention;Memory;Following commands;Safety/judgement;Awareness;Problem solving Current Attention Level: Sustained (short time) Memory: Decreased short-term memory;Decreased recall of precautions Following Commands: Follows one step commands consistently Safety/Judgement: Decreased awareness of safety;Decreased awareness of deficits Awareness: Intellectual Problem Solving: Slow processing;Decreased initiation;Requires verbal cues;Requires tactile cues;Difficulty sequencing General Comments: Pt impulsive and requiring simple, direct cues. Follow one step commands with calm environement. At end of session, providsing questioning cues to have pt review session, pt abel to report what he did and why with Mod questioning cues  @@Continues  to have issues with impulsivity therefore requiring safety belt to prevent getting out of bed unassisted-see below regarding fall@@  Mechanical fall on 03/03/21 and 03/05/21. A ct head without contrast was obtained, showed  increased size of the intraparenchymal hematoma with worsening leftward midline shift, now measuring 8 mm.Effaced right lateral ventricle, which may become entrapped and predisposes to hydrocephalus. He was transferred to ICU overnight, neuro surgery consulted and a repeat CT head ordered which was essentially stable .  Pt transferred back to the floor.  Bed side sitter ordered for safety.   Right anterior shoulder pain/progressive hypertonicity -Suspect mild muscle strain as this is extremity patient uses to reposition self in bed post stroke -Continue heat,Voltaren gel tizanidine noting improvement in symptoms  Physical Deconditioning PT eval 4/22:    Pt acknowledges that  he is leaning laterally towards the L when asked. Pt continues to require physical assistance and cueing for bed mobility and OOB activities, including transfers. Pt requires physical assistance at L knee to prevent buckling and to maintain trunk in midline during standing and pregait activities. No signs of L pushing observed during this session. Pt tolerates pregait activities well and may benefit from initiation of gait training with railing in hallway. Pt will continue to benefit from acute PT to address deficits in strength, balance, endurance, and motor planning.           OT eval 4/22:   Pt progressing towards established OT goals. Pt continues to present with high motivation to participate in therapy. Pt performing squat pivot to recliner with Max A +2. Pt performing sit<>stand at sink with Mod A +2 and blocking of L knee. Pt continues to present with left lateral lean but is able to correct posture with Min cues. Pt performing oral care at sink (seated) with Mod A. Continue to recommend dc to post-acute rehab. Will continue to follow acutely as admitted.   Ortho Devices Type of Ortho Device: Prafo boot/shoe Ortho Device/Splint Location: LLE Ortho Device/Splint Interventions: Ordered,Application,Adjustment   Post Interventions Patient Tolerated: Well Instructions Provided: Care of device     Hypertension Not well controlled.  Increase hydralazine 50 mg TID.  in addition to Norvasc 10 mg, clonidine 0.1 mg daily, isosorbide 15 mg daily, lisinopril 10 mg daily  Hyperlipidemia Continue with statin.  Type 2 diabetes mellitus uncontrolled with hyperglycemia Hemoglobin A1 c is 6.4. not on insuliin at home.  Current CBGs well-controlled over the past 24 hours with range of 91-180 Continue with Metformin. Continue with SSI.   Hyponatremia Much  improved to 134.  Probably secondary to poor oral intake. Thiazide diuretic has been discontinued. Serum osmolality was slightly  low at 274 with low normal 275. 4/11 no indication to continue fluid restriction therefore this was discontinued.     Other problems: Hypokalemia Replaced, repeat level wnl.   Mild abdominal pain: abd film is negative for obstruction.   Mild elevation of liver enzymes.  [place] ABD ordered and is negative.    Data Reviewed: Basic Metabolic Panel: Recent Labs  Lab 03/19/21 0252  NA 133*  K 3.9  CL 98  CO2 28  GLUCOSE 99  BUN 14  CREATININE 0.99  CALCIUM 8.9   Liver Function Tests: Recent Labs  Lab 03/19/21 0252  AST 25  ALT 31  ALKPHOS 38  BILITOT 0.5  PROT 5.6*  ALBUMIN 2.7*   No results for input(s): LIPASE, AMYLASE in the last 168 hours. No results for input(s): AMMONIA in the last 168 hours. CBC: Recent Labs  Lab 03/19/21 0252  WBC 5.7  HGB 12.6*  HCT 38.3*  MCV 87.2  PLT 319   Cardiac Enzymes: No results for input(s): CKTOTAL, CKMB, CKMBINDEX, TROPONINI in the last 168 hours. BNP (last 3 results) No results for input(s): BNP in the last 8760 hours.  ProBNP (last 3 results) No results for input(s): PROBNP in the last 8760 hours.  CBG: Recent Labs  Lab 03/20/21 0756 03/20/21 1239 03/20/21 1608 03/20/21 2033 03/21/21 0600  GLUCAP 103* 101* 115* 112* 97    No results found for this or any previous visit (from the past 240 hour(s)).   Studies: No results found.  Scheduled Meds: . amLODipine  10 mg Oral Daily  . atorvastatin  20 mg Oral Daily  . carvedilol  6.25 mg Oral BID WC  . diclofenac Sodium  4 g Topical QID  . feeding supplement  237 mL Oral TID BM  . hydrALAZINE  100 mg Oral Q8H  . insulin aspart  0-5 Units Subcutaneous QHS  . insulin aspart  0-9 Units Subcutaneous TID WC  . lisinopril  40 mg Oral Daily  . metFORMIN  500 mg Oral Q breakfast  . multivitamin with minerals  1 tablet Oral Daily  . pantoprazole  40 mg Oral QHS  . polyethylene glycol  17 g Oral Daily  . senna-docusate  1 tablet Oral BID  . sertraline  50 mg  Oral Daily  . tiZANidine  2 mg Oral QHS  . 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

## 2021-03-21 NOTE — TOC Progression Note (Signed)
Transition of Care [facility]) - Progression Note    Patient Details  Name: Chad Coleman MRN: [id number] Date of Birth: Nov 20, 1961  Transition of Care [facility]) CM/SW [place], RN Phone Number: 03/21/2021, 8:55 AM  Clinical Narrative:    Case management reached out to Gayla Medicus, CM with [facility] by phone and asked that she review to the patient's clinicals for possible placement at a SNF facility.  The patient's sister, Chad Coleman, is requesting to have patient placed at a SNF facility close to [place], [place], near to her home.  CM and MSW will continue to explore SNF facilities for placement.   Expected Discharge Plan: [place] Barriers to Discharge: Continued Medical Work up,Inadequate or no insurance,Active Substance Use - Placement  Expected Discharge Plan and Services Expected Discharge Plan: [place] In-house Referral: Clinical Social Work,Chaplain Discharge Planning Services: CM Consult Post Acute Care Choice: [place] Living arrangements for the past 2 months: [place] Determinants of Health (SDOH) Interventions    Readmission Risk Interventions No flowsheet data found.

## 2021-03-21 NOTE — Progress Notes (Signed)
Physical Therapy Treatment Patient Details Name: Chad Coleman MRN: [id number] DOB: 29-May-1961 Today's Date: 03/21/2021    History of Present Illness 60 yo male presents to [facility] on 3/28 with acute onset of L weakness, numbness, and L facial droop. CTH R BG ICH, suspect hypertensive.  Fall on 02/28/21. imaging showing increased in R intraparenchymal hematoma with worsening leftward midline shift on 4/7. PMH includes hypertension, tobacco use, noncompliance with antihypertensive medications, and cocaine abuse.    PT Comments    After ~45 minutes in recliner pt had begun scooting down in chair. Used Stedy to assist pt back to bed.    Follow Up Recommendations  Supervision/Assistance - 24 hour;SNF     Equipment Recommendations  Wheelchair (measurements PT);Wheelchair cushion (measurements PT)    Recommendations for Other Services       Precautions / Restrictions Precautions Precautions: Fall Precaution Comments: Pt with 2 in hospital falls. Pusher, mild L inattention, impulsive    Mobility  Bed Mobility Overal bed mobility: Needs Assistance Bed Mobility: Sit to Supine      Sit to supine: Mod assist   General bed mobility comments: Assist to bring LLE back up into bed and to control descent of trunk,    Transfers Overall transfer level: Needs assistance Equipment used: Ambulation equipment used Transfers: Sit to/from Stand Sit to Stand: +2 physical assistance;Max assist Stand pivot transfers: Max assist;+2 physical assistance       General transfer comment: Assist to bring hips up and for balance. Used Stedy for chair to bed  Ambulation/Gait                Stairs             Wheelchair Mobility    Modified Rankin (Stroke Patients Only) Modified Rankin (Stroke Patients Only) Pre-Morbid Rankin Score: No symptoms Modified Rankin: Severe disability     Balance Overall balance assessment: Needs assistance Sitting-balance support: Feet  supported Sitting balance-Leahy Scale: Poor Sitting balance - Comments: min to mod assist for sitting EOB Postural control: Left lateral lean Standing balance support: Bilateral upper extremity supported;During functional activity Standing balance-Leahy Scale: Poor Standing balance comment: +2 mod for static standing with Stedy                            Cognition Arousal/Alertness: Awake/alert Behavior During Therapy: WFL for tasks assessed/performed Overall Cognitive Status: Impaired/Different from baseline Area of Impairment: Safety/judgement;Attention;Memory;Awareness;Problem solving                   Current Attention Level: Sustained Memory: Decreased short-term memory Following Commands: Follows one step commands with increased time Safety/Judgement: Decreased awareness of safety;Decreased awareness of deficits Awareness: Emergent Problem Solving: Slow processing;Requires verbal cues;Requires tactile cues        Exercises      General Comments        Pertinent Vitals/Pain Pain Assessment: Faces Faces Pain Scale: Hurts a little bit Pain Location: L shoulder Pain Descriptors / Indicators: Grimacing;Discomfort Pain Intervention(s): Limited activity within patient's tolerance    Home Living                      Prior Function            PT Goals (current goals can now be found in the care plan section) Progress towards PT goals: Progressing toward goals    Frequency    Min 3X/week      PT  Plan Current plan remains appropriate    Co-evaluation              AM-PAC PT "6 Clicks" Mobility   Outcome Measure  Help needed turning from your back to your side while in a flat bed without using bedrails?: A Lot Help needed moving from lying on your back to sitting on the side of a flat bed without using bedrails?: A Lot Help needed moving to and from a bed to a chair (including a wheelchair)?: A Lot Help needed standing up  from a chair using your arms (e.g., wheelchair or bedside chair)?: A Lot Help needed to walk in hospital room?: Total Help needed climbing 3-5 steps with a railing? : Total 6 Click Score: 10    End of Session Equipment Utilized During Treatment: Gait belt Activity Tolerance: Patient tolerated treatment well Patient left: with call bell/phone within reach;in bed;with bed alarm set;with SCD's reapplied Nurse Communication: Mobility status;Need for lift equipment (Nurse tech assisted) PT Visit Diagnosis: Hemiplegia and hemiparesis;Unsteadiness on feet (R26.81);Muscle weakness (generalized) (M62.81);Difficulty in walking, not elsewhere classified (R26.2);Other symptoms and signs involving the nervous system (R29.898) Hemiplegia - Right/Left: Left Hemiplegia - dominant/non-dominant: Dominant Hemiplegia - caused by: Nontraumatic intracerebral hemorrhage     Time: 7893-8101 PT Time Calculation (min) (ACUTE ONLY): 12 min  Charges:  $Therapeutic Activity: 8-22 mins                     [employer] Pager [phone] Office [place] 03/21/2021, 6:20 PM

## 2021-03-22 LAB — GLUCOSE, CAPILLARY
Glucose-Capillary: 91 mg/dL (ref 70–99)
Glucose-Capillary: 99 mg/dL (ref 70–99)
Glucose-Capillary: 132 mg/dL — ABNORMAL HIGH (ref 70–99)
Glucose-Capillary: 109 mg/dL — ABNORMAL HIGH (ref 70–99)

## 2021-03-22 NOTE — Progress Notes (Addendum)
[facility] PROGRESS NOTE  Chad Coleman WNI:[id number] DOB: 01-Apr-1961 DOA: 02/21/2021 PCP: Patient, No Pcp Per (Inactive)  Status: Remains inpatient appropriate because:Altered mental status, Unsafe d/c plan and Inpatient level of care appropriate due to severity of illness   Dispo: The patient is from: Home              Anticipated d/c is to: SNF              Patient currently is medically stable to d/c.               Barriers to DC: No insurance.  Medicaid has been filed and a letter was dictated to clarify patient's underlying medical status to help qualify for Medicaid and disability.  Sister in process of becoming patient's [facility] POA-multiple bed requests have been sent   Difficult to place patient Yes  4/15: FMLA paperwork completed, scanned into our system and returned by email to patient sister as requested  Level of care: Med-Surg  Code Status: Full Family Communication: 4/13 Sister Chad Coleman [phone]. DVT prophylaxis: SCDs Vaccination status: Unknown  Foley catheter: Condom catheter  HPI: 60 year old gentleman prior history of hypertension, cocaine use, tobacco abuse admitted on 02/21/2021 with a code stroke was found to have intracranial hemorrhage in the right basal ganglia secondary to hypertension and cocaine use.  Patient was transferred from neurology service to hospitalist service on 02/26/2021. Therapy evaluations recommending SNF.  pt had a fall on 03/03/21, he underwent CT head showing increased size of the intraparenchymal hematoma with worsening leftward midline shift, now measuring 8 mm.Effaced right lateral ventricle, which may become entrapped and predisposes to hydrocephalus. He was transferred to ICU overnight, neuro surgery consulted and a repeat CT head ordered which was essentially stable .  Pt had another fall in the room on 03/05/21, repeat CT head without contrast showed Unchanged appearance of right basal ganglia intraparenchymal hematoma with 7  mm of leftward midline shift.  Subjective: Continues to have mild discomfort involving the right shoulder but has full range of motion.  Asking questions about when he will discharge.  Objective: Vitals:   03/22/21 0015 03/22/21 0333  BP: 102/67 118/81  Pulse: 70 82  Resp: 19 18  Temp: 98.9 F (37.2 C) 99 F (37.2 C)  SpO2: 98% 97%    Intake/Output Summary (Last 24 hours) at 03/22/2021 0741 Last data filed at 03/21/2021 2358 Gross per 24 hour  Intake 780 ml  Output 1025 ml  Net -245 ml   Filed Weights   02/25/21 1357  Weight: 73.2 kg    Exam:  Constitutional: Alert, calm, appears to be comfortable.  Student nurse at bedside assisting with a.m. care and performing bed bath. Cardiovascular: S1-S2, no peripheral edema, regular pulse. Abdomen:   LBM 4/25, nontender nondistended always eats 100% of meals Neurologic: CN 2-12 grossly intact. Sensation intact, DTR normal. Strength 5/5 on the right with a dense, insensate left hemiplegia involving both the upper and lower extremity.  Improved extremity hypertonicity. Psychiatric alert and oriented x3.   Assessment/Plan: Acute problems: Intracranial hemorrhage with associated brain injury Echocardiogram showed left ventricle ejection fraction 65 to 70% with grade 1 diastolic dysfunction. LDL 52. Speech evaluation 4/21 with new recommendation for dysphagia 3/mechanical soft diet with thin liquid. Zoloft 50 mg daily for brain injury related impulsivity Cognitive eval per OT/SLP has demonstrated deficits (see below) SLP COGNITIVE EVAL 4/8: Overall, his speech-language-cognitive function appears similar to assessment on 3/29 with exception of working memory. Severity  of dysarthria was unchanged and is mild-mod in conversation. Expressive and receptive language is functional. On SLUMS assessment he scored a 15 out of possible 30 requiring cues to retrieve 3/5 on word recall, difficulty with mental calculations, accurate with 1 of 4  questions for auditory recall of paragraph. ST will continue treatment to increase independence.  OT COGNITIVE EVAL 4/8:  Arousal/Alertness: Awake/alert Behavior During Therapy: Flat affect Overall Cognitive Status: Impaired/Different from baseline Area of Impairment: Attention;Memory;Following commands;Safety/judgement;Awareness;Problem solving Current Attention Level: Sustained (short time) Memory: Decreased short-term memory;Decreased recall of precautions Following Commands: Follows one step commands consistently Safety/Judgement: Decreased awareness of safety;Decreased awareness of deficits Awareness: Intellectual Problem Solving: Slow processing;Decreased initiation;Requires verbal cues;Requires tactile cues;Difficulty sequencing General Comments: Pt impulsive and requiring simple, direct cues. Follow one step commands with calm environement. At end of session, providsing questioning cues to have pt review session, pt abel to report what he did and why with Mod questioning cues    Mechanical fall on 03/03/21 and 03/05/21. A ct head without contrast was obtained, showed  increased size of the intraparenchymal hematoma with worsening leftward midline shift, now measuring 8 mm.Effaced right lateral ventricle, which may become entrapped and predisposes to hydrocephalus. He was transferred to ICU overnight, neuro surgery consulted and a repeat CT head ordered which was essentially stable .  Pt transferred back to the floor.  Bed side sitter ordered for safety.   Right anterior shoulder pain/progressive hypertonicity -Suspect mild muscle strain as this is extremity patient uses to reposition self in bed post stroke -Continue heat,Voltaren gel tizanidine noting improvement in symptoms  Physical Deconditioning PT eval 4/25: Pt making slow steady progress. Still with significant deficits in strength, balance, and cognition. Requires 2 person assist for basic mobility and needs continued focus  on balance and stability before he will see significant progress toward gait and transfers.     Follow Up Recommendations  Supervision/Assistance - 24 hour;SNF   After ~45 minutes in recliner pt had begun scooting down in chair. Used Stedy to assist pt back to bed.       OT eval 4/22:   Pt progressing towards established OT goals. Pt continues to present with high motivation to participate in therapy. Pt performing squat pivot to recliner with Max A +2. Pt performing sit<>stand at sink with Mod A +2 and blocking of L knee. Pt continues to present with left lateral lean but is able to correct posture with Min cues. Pt performing oral care at sink (seated) with Mod A. Continue to recommend dc to post-acute rehab. Will continue to follow acutely as admitted.   Ortho Devices Type of Ortho Device: Prafo boot/shoe Ortho Device/Splint Location: LLE Ortho Device/Splint Interventions: Ordered,Application,Adjustment   Post Interventions Patient Tolerated: Well Instructions Provided: Care of device     Hypertension Not well controlled.  Increase hydralazine 50 mg TID.  in addition to Norvasc 10 mg, clonidine 0.1 mg daily, isosorbide 15 mg daily, lisinopril 10 mg daily  Hyperlipidemia Continue with statin.  Type 2 diabetes mellitus uncontrolled with hyperglycemia Hemoglobin A1 c is 6.4. not on insuliin at home.  Current CBGs well-controlled over the past 24 hours with range of 91-117 Continue with Metformin. Continue with SSI.   Hyponatremia Much improved to 134.  Probably secondary to poor oral intake. Thiazide diuretic has been discontinued. Serum osmolality was slightly low at 274 with low normal 275. 4/11 no indication to continue fluid restriction therefore this was discontinued.     Other problems: Hypokalemia Replaced,  repeat level wnl.   Mild abdominal pain: abd film is negative for obstruction.   Mild elevation of liver enzymes.  [place] ABD ordered and is  negative.    Data Reviewed: Basic Metabolic Panel: Recent Labs  Lab 03/19/21 0252  NA 133*  K 3.9  CL 98  CO2 28  GLUCOSE 99  BUN 14  CREATININE 0.99  CALCIUM 8.9   Liver Function Tests: Recent Labs  Lab 03/19/21 0252  AST 25  ALT 31  ALKPHOS 38  BILITOT 0.5  PROT 5.6*  ALBUMIN 2.7*   No results for input(s): LIPASE, AMYLASE in the last 168 hours. No results for input(s): AMMONIA in the last 168 hours. CBC: Recent Labs  Lab 03/19/21 0252  WBC 5.7  HGB 12.6*  HCT 38.3*  MCV 87.2  PLT 319   Cardiac Enzymes: No results for input(s): CKTOTAL, CKMB, CKMBINDEX, TROPONINI in the last 168 hours. BNP (last 3 results) No results for input(s): BNP in the last 8760 hours.  ProBNP (last 3 results) No results for input(s): PROBNP in the last 8760 hours.  CBG: Recent Labs  Lab 03/21/21 0600 03/21/21 1339 03/21/21 1646 03/21/21 2116 03/22/21 0614  GLUCAP 97 93 97 117* 91    No results found for this or any previous visit (from the past 240 hour(s)).   Studies: No results found.  Scheduled Meds: . amLODipine  10 mg Oral Daily  . atorvastatin  20 mg Oral Daily  . carvedilol  6.25 mg Oral BID WC  . diclofenac Sodium  4 g Topical QID  . feeding supplement  237 mL Oral TID BM  . hydrALAZINE  100 mg Oral Q8H  . insulin aspart  0-5 Units Subcutaneous QHS  . insulin aspart  0-9 Units Subcutaneous TID WC  . lisinopril  40 mg Oral Daily  . metFORMIN  500 mg Oral Q breakfast  . multivitamin with minerals  1 tablet Oral Daily  . pantoprazole  40 mg Oral QHS  . polyethylene glycol  17 g Oral Daily  . senna-docusate  1 tablet Oral BID  . sertraline  50 mg Oral Daily  . tiZANidine  2 mg Oral QHS  . valproic acid  250 mg Oral BID   Continuous Infusions:  Active Problems:   Intraparenchymal hemorrhage of brain (HCC)   Hypertension   Tobacco abuse   Hyponatremia   Leukocytosis   Controlled type 2 diabetes mellitus with hyperglycemia, without long-term current  use of insulin ([place])   Brain injury with brief loss of consciousness ([place])   Acute pain of right shoulder   Consultants:  Neurology  Neurosurgery  Procedures:  Echocardiogram  Antibiotics: Anti-infectives (From admission, onward)   None       Time spent: 20 minutes    Erin Hearing ANP  [facility] 7 am - 330 pm/M-F for direct patient care and secure chat Please refer to Amion for contact info 29  days

## 2021-03-22 NOTE — Progress Notes (Signed)
Occupational Therapy Treatment Patient Details Name: Chad Coleman MRN: [id number] DOB: [birth year] Today's Date: 03/22/2021    History of present illness 60 yo male presents [facility] on 3/28 with acute onset of L weakness, numbness, and L facial droop. CTH R BG ICH, suspect hypertensive.  Fall on 02/28/21. imaging showing increased in R intraparenchymal hematoma with worsening leftward midline shift on 4/7. PMH includes hypertension, tobacco use, noncompliance with antihypertensive medications, and cocaine abuse.   OT comments  Pt making steady progress towards OT goals this session. Pt continues to present with impaired balance, decreased activity tolerance, L sided weakness and impaired proprioception. Session focus on static sitting/ standing balance with pt needing MOD A +2 to sit<>stand from EOB with HHA, used mirror this session to provide visual feedback to facilitate improved midline positioning. When cued, pt able to self correct posture however pt needed cues to attend to mirror during session. Pt did require less assist to complete bed mobility this session needing MAX A +1 to transition into sitting. Pt with incontinent BM during session needing MOD A +1 to roll to pts L side for pericare and total A for posterior pericare. DC plan remains appropriate, will continues to follow acutely per POC.    Follow Up Recommendations  SNF    Equipment Recommendations  None recommended by OT    Recommendations for Other Services      Precautions / Restrictions Precautions Precautions: Fall Precaution Comments: Pt with 2 in hospital falls. Pusher, mild L inattention, impulsive Restrictions Weight Bearing Restrictions: No       Mobility Bed Mobility Overal bed mobility: Needs Assistance Bed Mobility: Supine to Sit;Sit to Supine;Rolling Rolling: Mod assist   Supine to sit: Max assist;HOB elevated Sit to supine: Max assist;+2 for physical assistance   General bed mobility comments:  MAX A +1 to progress to EOB with pt unable to progress BLES to EOB without assist despite cues on placing RLE underneath LLE, pt additionally required assist to elevate trunk into sitting. MAX A +2 to return to supine needing assist to elevate BLEs back to bed and lower trunk back to supine, pt required MOD A +1 to roll completely into sidelying rolling to pts L side for pericare    Transfers Overall transfer level: Needs assistance Equipment used: 2 person hand held assist Transfers: Sit to/from Stand Sit to Stand: Mod assist;+2 physical assistance         General transfer comment: pt requried assist to block BLEs during standing and assist to shift hips anteriorly into standing, use of mirror used for visual feedback to elevate trunk and maintain midline posture.    Balance Overall balance assessment: Needs assistance Sitting-balance support: Feet supported;Single extremity supported Sitting balance-Leahy Scale: Poor Sitting balance - Comments: min guard - MOD A for static sitting balance EOB with use of mirror, cues needed to attend to mirror   Standing balance support: Bilateral upper extremity supported;During functional activity Standing balance-Leahy Scale: Poor Standing balance comment: MOD A +2 for static standing, worked on lateral weight shifts with mirror to assist pt with maintaining upright posture and midline positioning                           ADL either performed or assessed with clinical judgement   ADL Overall ADL's : Needs assistance/impaired  Toilet Transfer: Moderate assistance;+2 for physical assistance Toilet Transfer Details (indicate cue type and reason): sit<>stand only from EOB with HHA Toileting- Clothing Manipulation and Hygiene: Total assistance;Bed level Toileting - Clothing Manipulation Details (indicate cue type and reason): posterior pericare from bed level d/t incontinent BM     Functional mobility  during ADLs: Moderate assistance;+2 for physical assistance (sit<>stand only) General ADL Comments: session focus on sit<>stands from EOB and static sitting balance with use of mirror for visual feedback     Vision       Perception     Praxis      Cognition Arousal/Alertness: Awake/alert Behavior During Therapy: WFL for tasks assessed/performed;Flat affect Overall Cognitive Status: Impaired/Different from baseline Area of Impairment: Safety/judgement;Attention;Awareness;Problem solving                   Current Attention Level: Sustained   Following Commands: Follows one step commands with increased time Safety/Judgement: Decreased awareness of safety;Decreased awareness of deficits Awareness: Emergent Problem Solving: Slow processing;Requires verbal cues;Requires tactile cues General Comments: use of mirror helped with awareness however pt required cues to attend to mirror during session        Exercises General Exercises - Upper Extremity Shoulder Flexion: Self ROM;Left;5 reps;Supine General Exercises - Lower Extremity Long Arc Quad: PROM;Left;10 reps;Seated   Shoulder Instructions       General Comments student nurse assisting pt with bath at end of session from bed level    Pertinent Vitals/ Pain       Pain Assessment: Faces Faces Pain Scale: Hurts even more Pain Location: L shoulder Pain Descriptors / Indicators: Grimacing;Discomfort Pain Intervention(s): Limited activity within patient's tolerance;Monitored during session;Repositioned  Home Living                                          Prior Functioning/Environment              Frequency  Min 2X/week        Progress Toward Goals  OT Goals(current goals can now be found in the care plan section)  Progress towards OT goals: Progressing toward goals  Acute Rehab OT Goals Patient Stated Goal: to go home OT Goal Formulation: With patient Time For Goal Achievement:  03/25/21 Potential to Achieve Goals: [place] Discharge plan remains appropriate;Frequency remains appropriate    Co-evaluation                 AM-PAC OT "6 Clicks" Daily Activity     Outcome Measure   Help from another person eating meals?: A Little Help from another person taking care of personal grooming?: A Little Help from another person toileting, which includes using toliet, bedpan, or urinal?: A Lot Help from another person bathing (including washing, rinsing, drying)?: A Lot Help from another person to put on and taking off regular upper body clothing?: A Lot Help from another person to put on and taking off regular lower body clothing?: Total 6 Click Score: 13    End of Session Equipment Utilized During Treatment: Gait belt;Other (comment) (mirror)  OT Visit Diagnosis: Unsteadiness on feet (R26.81);Other abnormalities of gait and mobility (R26.89);Muscle weakness (generalized) (M62.81);Hemiplegia and hemiparesis;Pain Hemiplegia - Right/Left: Left Hemiplegia - dominant/non-dominant: Dominant Hemiplegia - caused by: Cerebral infarction Pain - Right/Left: Left Pain - part of body: Shoulder   Activity Tolerance Patient tolerated treatment well   Patient Left in  bed;with call bell/phone within reach;with nursing/sitter in room;Other (comment) (student RN assisting pt with bath)   Nurse Communication Mobility status        Time: [phone] OT Time Calculation (min): 18 min  Charges: OT General Charges $OT Visit: 1 Visit OT Treatments $Therapeutic Activity: 8-22 mins  Harley [place]., COTA/L Acute Rehabilitation Services [place] 03/22/2021, 11:18 AM

## 2021-03-22 NOTE — Progress Notes (Signed)
  Speech Language Pathology Treatment: Dysphagia;Cognitive-Linquistic  Patient Details Name: Chad Coleman MRN: [id number] DOB: Oct 06, 1961 Today's Date: 03/22/2021 Time: 1410-1430 SLP Time Calculation (min) (ACUTE ONLY): 20 min  Assessment / Plan / Recommendation Clinical Impression  Pt seen for diet tolerance and cognitive intervention. When asked about current diet pt initially stated mechanical soft was easier to chew. During later cognitive task of ordering dinner from menu, pt requesting reuglar texture foods. Pt with missing dentition and prolonged mastication with decreased bolus cohesion and mild oral residuals.  No overt s/sx of aspiration with thin liquids or softer and regular textures. Recommend diet advancement to regular textures and thin liquids with full supervision with cueing for compensatory swallowing strategies including small bites sips, left lingual sweep for reducing residuals, and thin liquid alternation.  SLP facilitated functional problem solving tasks including appropriate use of remote for independently changing TV chanels, pt required mild cues to locate proper buttons. Pt required mild assistance for meal ordering task. SLP to follow up.    HPI HPI: 60 y.o. male with a medical history significant for hypertension, tobacco use, noncompliance with antihypertensive medications, and cocaine abuse who presented to the ED via EMS as a Code Stroke for evaluation of acute onset of left-sided weakness, numbness, and left facial droop. Dx right basal ganglia ICH. Evening of 4/7 pt fell from bed with CT revealing Increased size of 5.8 x 3.6 cm right intraparenchymal hematoma  with worsening leftward midline shift and eval. ST f/u for cognition/diet tolerance      SLP Plan  Continue with current plan of care       Recommendations  Diet recommendations: Thin liquid;Regular Liquids provided via: Straw Supervision: Full supervision/cueing for compensatory strategies;Staff to  assist with self feeding Compensations: Lingual sweep for clearance of pocketing;Small sips/bites;Slow rate;Minimize environmental distractions;Follow solids with liquid Postural Changes and/or Swallow Maneuvers: Seated upright 90 degrees;Upright 30-60 min after meal                Oral Care Recommendations: Oral care BID Follow up Recommendations: Skilled Nursing facility SLP Visit Diagnosis: Dysphagia, oral phase (R13.11);Dysphagia, unspecified (R13.10) Plan: Continue with current plan of care       [place], CCC-SLP Acute Rehabilitation Services   03/22/2021, 2:40 PM

## 2021-03-23 ENCOUNTER — Inpatient Hospital Stay (HOSPITAL_COMMUNITY): Payer: Medicaid Other

## 2021-03-23 DIAGNOSIS — R509 Fever, unspecified: Secondary | ICD-10-CM

## 2021-03-23 DIAGNOSIS — J069 Acute upper respiratory infection, unspecified: Secondary | ICD-10-CM

## 2021-03-23 LAB — URINALYSIS, ROUTINE W REFLEX MICROSCOPIC
Bilirubin Urine: NEGATIVE
Glucose, UA: NEGATIVE mg/dL
Hgb urine dipstick: NEGATIVE
Ketones, ur: NEGATIVE mg/dL
Leukocytes,Ua: NEGATIVE
Nitrite: NEGATIVE
Protein, ur: 30 mg/dL — AB
Specific Gravity, Urine: 1.01 (ref 1.005–1.030)
pH: 6 (ref 5.0–8.0)

## 2021-03-23 LAB — SARS CORONAVIRUS 2 BY RT PCR (HOSPITAL ORDER, PERFORMED IN ~~LOC~~ HOSPITAL LAB): SARS Coronavirus 2: POSITIVE — AB

## 2021-03-23 LAB — CK: Total CK: 71 U/L (ref 49–397)

## 2021-03-23 LAB — GLUCOSE, CAPILLARY
Glucose-Capillary: 95 mg/dL (ref 70–99)
Glucose-Capillary: 136 mg/dL — ABNORMAL HIGH (ref 70–99)
Glucose-Capillary: 104 mg/dL — ABNORMAL HIGH (ref 70–99)
Glucose-Capillary: 101 mg/dL — ABNORMAL HIGH (ref 70–99)

## 2021-03-23 LAB — D-DIMER, QUANTITATIVE: D-Dimer, Quant: <0.27 ug/mL-FEU (ref 0.00–0.50)

## 2021-03-23 LAB — C-REACTIVE PROTEIN: CRP: 0.6 mg/dL (ref <1.0)

## 2021-03-23 LAB — LACTATE DEHYDROGENASE: LDH: 150 U/L (ref 98–192)

## 2021-03-23 MED ORDER — SODIUM CHLORIDE 0.9 % IV SOLN
200.0000 mg | Freq: Once | INTRAVENOUS | Status: AC
  Administered 2021-03-23: 200 mg via INTRAVENOUS
  Filled 2021-03-23: qty 40

## 2021-03-23 MED ORDER — SODIUM CHLORIDE 0.9 % IV SOLN
100.0000 mg | Freq: Every day | INTRAVENOUS | Status: AC
  Administered 2021-03-24 – 2021-03-25 (×2): 100 mg via INTRAVENOUS
  Filled 2021-03-23 (×2): qty 20

## 2021-03-23 NOTE — Progress Notes (Addendum)
[facility] PROGRESS NOTE  Chad Coleman WEX:[id number] DOB: Oct 20, 1961 DOA: 02/21/2021 PCP: Patient, No Pcp Per (Inactive)  Status: Remains inpatient appropriate because:Altered mental status, Unsafe d/c plan and Inpatient level of care appropriate due to severity of illness   Dispo: The patient is from: Home              Anticipated d/c is to: SNF              Patient currently is medically stable to d/c.               Barriers to DC: No insurance.  Medicaid has been filed and a letter was dictated to clarify patient's underlying medical status to help qualify for Medicaid and disability.  Sister in process of becoming patient's [facility] POA-multiple bed requests have been sent   Difficult to place patient Yes  4/15: FMLA paperwork completed, scanned into our system and returned by email to patient sister as requested  Level of care: Med-Surg  Code Status: Full Family Communication: 4/13 Sister Chad Coleman [phone]. DVT prophylaxis: SCDs Vaccination status: Fully vaccinated with Moderna vaccine with second shot in August 2021-has not received a booster shot  Foley catheter: Condom catheter  HPI: 60 year old gentleman prior history of hypertension, cocaine use, tobacco abuse admitted on 02/21/2021 with a code stroke was found to have intracranial hemorrhage in the right basal ganglia secondary to hypertension and cocaine use.  Patient was transferred from neurology service to hospitalist service on 02/26/2021. Therapy evaluations recommending SNF.  pt had a fall on 03/03/21, he underwent CT head showing increased size of the intraparenchymal hematoma with worsening leftward midline shift, now measuring 8 mm.Effaced right lateral ventricle, which may become entrapped and predisposes to hydrocephalus. He was transferred to ICU overnight, neuro surgery consulted and a repeat CT head ordered which was essentially stable .  Pt had another fall in the room on 03/05/21, repeat CT head  without contrast showed Unchanged appearance of right basal ganglia intraparenchymal hematoma with 7 mm of leftward midline shift.  Subjective: Awake.  Reporting generalized malaise, chills, headache and cough.  States he has been sick for about 3 days.  Objective: Vitals:   03/22/21 2329 03/23/21 0342  BP: (!) 152/83 126/75  Pulse: 88 66  Resp: 17 17  Temp: 98 F (36.7 C) 99.6 F (37.6 C)  SpO2: 93% 98%    Intake/Output Summary (Last 24 hours) at 03/23/2021 0750 Last data filed at 03/23/2021 0350 Gross per 24 hour  Intake --  Output 1200 ml  Net -1200 ml   Filed Weights   02/25/21 1357  Weight: 73.2 kg    Exam:  Constitutional: Awake, appears mildly toxic, uncomfortable secondary to headache Cardiovascular: S1-S2, no peripheral edema.  Pulse regular nontachycardic at this time; skin hot and dry Abdomen:   LBM 4/26, soft nontender nondistended.  Normoactive bowel sounds Neurologic: CN 2-12 grossly intact. Sensation intact, DTR normal. Strength 5/5 on the right with a dense, insensate left hemiplegia involving both the upper and lower extremity.  Improved extremity hypertonicity. Psychiatric alert and oriented x3.   Assessment/Plan: Acute problems: Fever 2/2 symptomatic COVID Patient noted to have T-max 101.4 F yesterday afternoon around 2 pm Today is reporting chills and headache and is noted to have a cough I asked him how long he felt he had been having some symptoms and he stated about 3 days. Rapid COVID PCR positive-we will check inflammatory markers and O2 sats; begin remdesivir.  If hypoxic will  need to begin IV steroid **resting O2 sats normal at 99%-we will obtain ambulatory O2 sats Obtain chest x-ray Check urinalysis and culture  Intracranial hemorrhage with associated brain injury Echocardiogram showed left ventricle ejection fraction 65 to 70% with grade 1 diastolic dysfunction. LDL 52. Speech evaluation 4/21 with new recommendation for dysphagia  3/mechanical soft diet with thin liquid. Zoloft 50 mg daily for brain injury related impulsivity Cognitive eval per OT/SLP has demonstrated deficits (see below) SLP COGNITIVE EVAL 4/8: Overall, his speech-language-cognitive function appears similar to assessment on 3/29 with exception of working memory. Severity of dysarthria was unchanged and is mild-mod in conversation. Expressive and receptive language is functional. On SLUMS assessment he scored a 15 out of possible 30 requiring cues to retrieve 3/5 on word recall, difficulty with mental calculations, accurate with 1 of 4 questions for auditory recall of paragraph. ST will continue treatment to increase independence.  OT COGNITIVE EVAL 4/8:  Arousal/Alertness: Awake/alert Behavior During Therapy: Flat affect Overall Cognitive Status: Impaired/Different from baseline Area of Impairment: Attention;Memory;Following commands;Safety/judgement;Awareness;Problem solving Current Attention Level: Sustained (short time) Memory: Decreased short-term memory;Decreased recall of precautions Following Commands: Follows one step commands consistently Safety/Judgement: Decreased awareness of safety;Decreased awareness of deficits Awareness: Intellectual Problem Solving: Slow processing;Decreased initiation;Requires verbal cues;Requires tactile cues;Difficulty sequencing General Comments: Pt impulsive and requiring simple, direct cues. Follow one step commands with calm environement. At end of session, providsing questioning cues to have pt review session, pt abel to report what he did and why with Mod questioning cues   Dysphagia SLP evaluation 4/26: Pt seen for diet tolerance and cognitive intervention. When asked about current diet pt initially stated mechanical soft was easier to chew. During later cognitive task of ordering dinner from menu, pt requesting reuglar texture foods. Pt with missing dentition and prolonged mastication with decreased bolus  cohesion and mild oral residuals.  No overt s/sx of aspiration with thin liquids or softer and regular textures. Recommend diet advancement to regular textures and thin liquids with full supervision with cueing for compensatory swallowing strategies including small bites sips, left lingual sweep for reducing residuals, and thin liquid alternation.  SLP facilitated functional problem solving tasks including appropriate use of remote for independently changing TV chanels, pt required mild cues to locate proper buttons. Pt required mild assistance for meal ordering task. SLP to follow up.    Mechanical fall on 03/03/21 and 03/05/21. A ct head without contrast was obtained, showed  increased size of the intraparenchymal hematoma with worsening leftward midline shift, now measuring 8 mm.Effaced right lateral ventricle, which may become entrapped and predisposes to hydrocephalus. He was transferred to ICU overnight, neuro surgery consulted and a repeat CT head ordered which was essentially stable .  Pt transferred back to the floor.  Bed side sitter ordered for safety.   Right anterior shoulder pain/progressive hypertonicity -Suspect mild muscle strain as this is extremity patient uses to reposition self in bed post stroke -Continue heat,Voltaren gel tizanidine noting improvement in symptoms  Physical Deconditioning PT eval 4/25: Pt making slow steady progress. Still with significant deficits in strength, balance, and cognition. Requires 2 person assist for basic mobility and needs continued focus on balance and stability before he will see significant progress toward gait and transfers.     Follow Up Recommendations  Supervision/Assistance - 24 hour;SNF   After ~45 minutes in recliner pt had begun scooting down in chair. Used Stedy to assist pt back to bed.       OT eval 4/26:  Pt making steady progress towards OT goals this session. Pt continues to present with impaired balance, decreased  activity tolerance, L sided weakness and impaired proprioception. Session focus on static sitting/ standing balance with pt needing MOD A +2 to sit<>stand from EOB with HHA, used mirror this session to provide visual feedback to facilitate improved midline positioning. When cued, pt able to self correct posture however pt needed cues to attend to mirror during session. Pt did require less assist to complete bed mobility this session needing MAX A +1 to transition into sitting. Pt with incontinent BM during session needing MOD A +1 to roll to pts L side for pericare and total A for posterior pericare. DC plan remains appropriate, will continues to follow acutely per POC.   Ortho Devices Type of Ortho Device: Prafo boot/shoe Ortho Device/Splint Location: LLE Ortho Device/Splint Interventions: Ordered,Application,Adjustment   Post Interventions Patient Tolerated: Well Instructions Provided: Care of device     Hypertension Not well controlled.  Increase hydralazine 50 mg TID.  in addition to Norvasc 10 mg, clonidine 0.1 mg daily, isosorbide 15 mg daily, lisinopril 10 mg daily  Hyperlipidemia Continue with statin.  Type 2 diabetes mellitus uncontrolled with hyperglycemia Hemoglobin A1 c is 6.4. not on insuliin at home.  Current CBGs well-controlled over the past 24 hours with range of 91-117 Continue with Metformin. Continue with SSI.   Hyponatremia Much improved to 134.  Probably secondary to poor oral intake. Thiazide diuretic has been discontinued. Serum osmolality was slightly low at 274 with low normal 275. 4/11 no indication to continue fluid restriction therefore this was discontinued.     Other problems: Hypokalemia Replaced, repeat level wnl.   Mild abdominal pain: abd film is negative for obstruction.   Mild elevation of liver enzymes.  [place] ABD ordered and is negative.    Data Reviewed: Basic Metabolic Panel: Recent Labs  Lab 03/19/21 0252  NA 133*  K  3.9  CL 98  CO2 28  GLUCOSE 99  BUN 14  CREATININE 0.99  CALCIUM 8.9   Liver Function Tests: Recent Labs  Lab 03/19/21 0252  AST 25  ALT 31  ALKPHOS 38  BILITOT 0.5  PROT 5.6*  ALBUMIN 2.7*   No results for input(s): LIPASE, AMYLASE in the last 168 hours. No results for input(s): AMMONIA in the last 168 hours. CBC: Recent Labs  Lab 03/19/21 0252  WBC 5.7  HGB 12.6*  HCT 38.3*  MCV 87.2  PLT 319   Cardiac Enzymes: No results for input(s): CKTOTAL, CKMB, CKMBINDEX, TROPONINI in the last 168 hours. BNP (last 3 results) No results for input(s): BNP in the last 8760 hours.  ProBNP (last 3 results) No results for input(s): PROBNP in the last 8760 hours.  CBG: Recent Labs  Lab 03/22/21 0614 03/22/21 1217 03/22/21 1619 03/22/21 2114 03/23/21 0620  GLUCAP 91 99 132* 109* 95    No results found for this or any previous visit (from the past 240 hour(s)).   Studies: No results found.  Scheduled Meds: . amLODipine  10 mg Oral Daily  . atorvastatin  20 mg Oral Daily  . carvedilol  6.25 mg Oral BID WC  . diclofenac Sodium  4 g Topical QID  . feeding supplement  237 mL Oral TID BM  . hydrALAZINE  100 mg Oral Q8H  . insulin aspart  0-5 Units Subcutaneous QHS  . insulin aspart  0-9 Units Subcutaneous TID WC  . lisinopril  40 mg Oral Daily  .  metFORMIN  500 mg Oral Q breakfast  . multivitamin with minerals  1 tablet Oral Daily  . pantoprazole  40 mg Oral QHS  . polyethylene glycol  17 g Oral Daily  . senna-docusate  1 tablet Oral BID  . sertraline  50 mg Oral Daily  . tiZANidine  2 mg Oral QHS  . valproic acid  250 mg Oral BID   Continuous Infusions:  Active Problems:   Intraparenchymal hemorrhage of brain (HCC)   Hypertension   Tobacco abuse   Hyponatremia   Leukocytosis   Controlled type 2 diabetes mellitus with hyperglycemia, without long-term current use of insulin ([place])   Brain injury with brief loss of consciousness ([place])   Acute pain of right  shoulder   Consultants:  Neurology  Neurosurgery  Procedures:  Echocardiogram  Antibiotics: Anti-infectives (From admission, onward)   None       Time spent: 20 minutes    Erin Hearing ANP  [facility] 7 am - 330 pm/M-F for direct patient care and secure chat Please refer to Amion for contact info 30  days

## 2021-03-23 NOTE — TOC Progression Note (Signed)
Transition of Care [facility]) - Progression Note    Patient Details  Name: Chad Coleman MRN: [id number] Date of Birth: March 24, 1961  Transition of Care [facility]) CM/SW [place], RN Phone Number: 03/23/2021, 2:40 PM  Clinical Narrative:    Case management spoke with Erin Hearing, NP and the patient developed a fever overnight with cough and COVID screen was determined to be positive for COVID at this time.  The patient remains on room air and is receiving continued medical treatment under respiratory and contact isolation for COVID infection and will remain under isolation orders.  Madilyn Fireman, MSW spoke with [facility] this morning and they are currently reviewing the patient's clinicals for possible admission.  The patient will need to be medically clear for discharge to a facility at a later date as determined by the physician, but MSW and CM will continue to follow the patient for admission to a SNF facility once patient is medically clear to do so.    Expected Discharge Plan: [place] Barriers to Discharge: Continued Medical Work up,Inadequate or no insurance,Active Substance Use - Placement  Expected Discharge Plan and Services Expected Discharge Plan: [place] In-house Referral: Clinical Social Work,Chaplain Discharge Planning Services: CM Consult Post Acute Care Choice: [place] Living arrangements for the past 2 months: [place] Determinants of Health (SDOH) Interventions    Readmission Risk Interventions No flowsheet data found.

## 2021-03-23 NOTE — Progress Notes (Signed)
Physical Therapy Treatment Patient Details Name: Chad Coleman MRN: [id number] DOB: 03/30/1961 Today's Date: 03/23/2021    History of Present Illness 60 yo male [facility] with acute onset of L weakness, numbness, and L facial droop. CTH R BG ICH, suspect hypertensive.  Fall on 02/28/21. imaging showing increased in R intraparenchymal hematoma with worsening leftward midline shift on 4/7. Tested postive for COVID on 4/27. PMH includes hypertension, tobacco use, noncompliance with antihypertensive medications, and cocaine abuse.    PT Comments    Patient continues to present with impaired sitting/standing balance, L sided weakness, decreased activity tolerance, and impaired cognition. Patient requires maxA to maintain sitting balance with heavy L lateral lean and maxA+2 in standing with B UE support. Patient able to find midline briefly with cueing but unable to maintain. Continue to recommend SNF for ongoing Physical Therapy.       Follow Up Recommendations  Supervision/Assistance - 24 hour;SNF     Equipment Recommendations  Wheelchair (measurements PT);Wheelchair cushion (measurements PT)    Recommendations for Other Services       Precautions / Restrictions Precautions Precautions: Fall Precaution Comments: Pt with 2 in hospital falls. Pusher, mild L inattention, impulsive Restrictions Weight Bearing Restrictions: No    Mobility  Bed Mobility Overal bed mobility: Needs Assistance Bed Mobility: Supine to Sit;Sit to Supine;Rolling Rolling: Min assist   Supine to sit: Max assist;HOB elevated Sit to supine: Max assist;+2 for physical assistance   General bed mobility comments: MinA to roll L/R for pericare on arrival due to bowel movement.MaxA+1 for supine>sit with patient initiating L LE movement to EOB and able to assist with trunk elevation. MaxA+2 to return to supine    Transfers Overall transfer level: Needs assistance Equipment used: 2 person hand held  assist Transfers: Sit to/from Stand Sit to Stand: Max assist;+2 physical assistance;+2 safety/equipment         General transfer comment: maxA+2 to stand from EOB and block L knee in standing. Unable to weight shift to take steps towards EOB.  Ambulation/Gait                 Stairs             Wheelchair Mobility    Modified Rankin (Stroke Patients Only) Modified Rankin (Stroke Patients Only) Pre-Morbid Rankin Score: No symptoms Modified Rankin: Severe disability     Balance Overall balance assessment: Needs assistance Sitting-balance support: Feet supported;Single extremity supported Sitting balance-Leahy Scale: Poor Sitting balance - Comments: min guard- maxA to maintain static sitting EOB with L lateral lean Postural control: Left lateral lean Standing balance support: Bilateral upper extremity supported;During functional activity Standing balance-Leahy Scale: Poor Standing balance comment: maxA+2 to maintain standing EOB                            Cognition Arousal/Alertness: Awake/alert Behavior During Therapy: Flat affect Overall Cognitive Status: Impaired/Different from baseline Area of Impairment: Safety/judgement;Attention;Awareness;Problem solving;Following commands                   Current Attention Level: Sustained   Following Commands: Follows one step commands with increased time;Follows one step commands inconsistently Safety/Judgement: Decreased awareness of safety;Decreased awareness of deficits Awareness: Emergent Problem Solving: Slow processing;Requires verbal cues;Requires tactile cues General Comments: cues required to attend to L side and finding midline      Exercises      General Comments  Pertinent Vitals/Pain Pain Assessment: No/denies pain    Home Living                      Prior Function            PT Goals (current goals can now be found in the care plan section) Acute  Rehab PT Goals Patient Stated Goal: to go home PT Goal Formulation: With patient Time For Goal Achievement: 04/06/21 Potential to Achieve Goals: Good Progress towards PT goals: Progressing toward goals    Frequency    Min 3X/week      PT Plan Current plan remains appropriate    Co-evaluation              AM-PAC PT "6 Clicks" Mobility   Outcome Measure  Help needed turning from your back to your side while in a flat bed without using bedrails?: A Lot Help needed moving from lying on your back to sitting on the side of a flat bed without using bedrails?: A Lot Help needed moving to and from a bed to a chair (including a wheelchair)?: A Lot Help needed standing up from a chair using your arms (e.g., wheelchair or bedside chair)?: A Lot Help needed to walk in hospital room?: Total Help needed climbing 3-5 steps with a railing? : Total 6 Click Score: 10    End of Session Equipment Utilized During Treatment: Gait belt Activity Tolerance: Patient tolerated treatment well Patient left: in bed;with call bell/phone within reach;with bed alarm set Nurse Communication: Mobility status PT Visit Diagnosis: Hemiplegia and hemiparesis;Unsteadiness on feet (R26.81);Muscle weakness (generalized) (M62.81);Difficulty in walking, not elsewhere classified (R26.2);Other symptoms and signs involving the nervous system (R29.898) Hemiplegia - Right/Left: Left Hemiplegia - dominant/non-dominant: Dominant Hemiplegia - caused by: Nontraumatic intracerebral hemorrhage     Time: 2355-7322 PT Time Calculation (min) (ACUTE ONLY): 35 min  Charges:  $Therapeutic Activity: 23-37 mins                     Iyad Deroo A. Gilford Rile PT, DPT Acute Rehabilitation Services Pager [phone] Office [phone]    Linna Hoff 03/23/2021, 5:41 PM

## 2021-03-23 NOTE — Plan of Care (Signed)
  Problem: Coping: Goal: Will verbalize positive feelings about self Outcome: Progressing   Problem: Self-Care: Goal: Ability to participate in self-care as condition permits will improve Outcome: Progressing   Problem: Nutrition: Goal: Dietary intake will improve Outcome: Progressing

## 2021-03-23 NOTE — Progress Notes (Signed)
Patient safely transferred to negative pressure room. Family updated on changes and they are aware that patient is positive for COVID.   Patient has no increase shortness of breath and oxygen remains the same at 98% when he goes from lying to sitting with assist to lying back down.

## 2021-03-24 LAB — HEPATIC FUNCTION PANEL
ALT: 50 U/L — ABNORMAL HIGH (ref 0–44)
AST: 49 U/L — ABNORMAL HIGH (ref 15–41)
Albumin: 2.9 g/dL — ABNORMAL LOW (ref 3.5–5.0)
Alkaline Phosphatase: 43 U/L (ref 38–126)
Bilirubin, Direct: 0.1 mg/dL (ref 0.0–0.2)
Indirect Bilirubin: 0.5 mg/dL (ref 0.3–0.9)
Total Bilirubin: 0.6 mg/dL (ref 0.3–1.2)
Total Protein: 6.2 g/dL — ABNORMAL LOW (ref 6.5–8.1)

## 2021-03-24 LAB — GLUCOSE, CAPILLARY
Glucose-Capillary: 104 mg/dL — ABNORMAL HIGH (ref 70–99)
Glucose-Capillary: 119 mg/dL — ABNORMAL HIGH (ref 70–99)
Glucose-Capillary: 94 mg/dL (ref 70–99)
Glucose-Capillary: 88 mg/dL (ref 70–99)

## 2021-03-24 LAB — BASIC METABOLIC PANEL
Anion gap: 9 (ref 5–15)
BUN: 15 mg/dL (ref 6–20)
CO2: 27 mmol/L (ref 22–32)
Calcium: 9 mg/dL (ref 8.9–10.3)
Chloride: 95 mmol/L — ABNORMAL LOW (ref 98–111)
Creatinine, Ser: 0.95 mg/dL (ref 0.61–1.24)
GFR, Estimated: >60 mL/min (ref >60)
Glucose, Bld: 94 mg/dL (ref 70–99)
Potassium: 4 mmol/L (ref 3.5–5.1)
Sodium: 131 mmol/L — ABNORMAL LOW (ref 135–145)

## 2021-03-24 LAB — D-DIMER, QUANTITATIVE: D-Dimer, Quant: <0.27 ug/mL-FEU (ref 0.00–0.50)

## 2021-03-24 LAB — CBC
HCT: 39.9 % (ref 39.0–52.0)
Hemoglobin: 13.1 g/dL (ref 13.0–17.0)
MCH: 28.7 pg (ref 26.0–34.0)
MCHC: 32.8 g/dL (ref 30.0–36.0)
MCV: 87.5 fL (ref 80.0–100.0)
Platelets: 291 10*3/uL (ref 150–400)
RBC: 4.56 MIL/uL (ref 4.22–5.81)
RDW: 12.4 % (ref 11.5–15.5)
WBC: 5.1 10*3/uL (ref 4.0–10.5)
nRBC: 0 % (ref 0.0–0.2)

## 2021-03-24 LAB — LACTATE DEHYDROGENASE: LDH: 140 U/L (ref 98–192)

## 2021-03-24 LAB — C-REACTIVE PROTEIN: CRP: 0.6 mg/dL (ref <1.0)

## 2021-03-24 LAB — CK: Total CK: 65 U/L (ref 49–397)

## 2021-03-24 NOTE — Progress Notes (Signed)
PROGRESS NOTE                                                                                                                                                                                                             Patient Demographics:    Chad Coleman, is a 60 y.o. male, DOB - [birth year], IR:[id number]  Outpatient Primary MD for the patient is Patient, No Pcp Per (Inactive)   Admit date - 02/21/2021   LOS - 24  Chief Complaint  Patient presents with  . Weakness       Brief Narrative: Patient is a 60 y.o. 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  While in the hospital-patient sustained a mechanical fall-following which we he had worsening of his intraparenchymal hematoma-requiring transfer to the ICU-neurosurgery consulted.  Upon further stability-he was transferred back to telemetry.  Further hospital course was complicated by fever-and was found to have COVID-19 infection.  COVID-19 vaccinated status: Vaccinated but not boosted.   Subjective:    Chad Coleman today lying comfortably in bed-afebrile this morning.   Assessment  & Plan :   COVID 19 infection: Febrile but no pneumonia-no hypoxia-needs Remdesivir x3 days.  If develops hypoxemia-we will add steroids-and extend Remdesivir for total of 5 days.  ICH: Continues to have dense left-sided deficits-remains on a dysphagia 3 diet-continue PT/OT eval.  Social work following for placement.  Dysphagia: Secondary to above-SLP following-on dysphagia 3 diet  Hyponatremia: Mild-stable for monitoring without any further work-up.  Mild transaminitis: Likely due to COVID-19-stable for monitoring.  HTN: BP reasonable-continue hydralazine, Norvasc, clonidine, Imdur and lisinopril.  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  Reviewed prior notes-neurology discussed with radiology-this appears to be a benign issue-imaging planned in the outpatient setting.  Cocaine/tobacco use: Counseled  ABG:    Component Value Date/Time   TCO2 29 02/21/2021 1737    Vent  Settings: N/A  Condition - Stable  Family Communication  : None at bedside  Code Status :  Full Code  Diet :  Diet Order            Diet regular Room service appropriate? Yes; Fluid consistency: Thin  Diet effective now                  Disposition Plan  :   Status is: Inpatient  Remains inpatient appropriate because:Inpatient level of care appropriate due to severity of illness   Dispo: The patient is from: Home              Anticipated d/c is to: SNF              Patient currently is not medically stable to d/c.   Difficult to place patient No    Barriers to discharge: Awaiting SNF bed-being treated for acute COVID infection with IV Remdesivir.  Antimicorbials  :    Anti-infectives (From admission, onward)   Start     Dose/Rate Route Frequency Ordered Stop   03/24/21 1000  remdesivir 100 mg in sodium chloride 0.9 % 100 mL IVPB       "Followed by" Linked Group Details   100 mg 200 mL/hr over 30 Minutes Intravenous Daily 03/23/21 1302 03/28/21 0959   03/23/21 1400  remdesivir 200 mg in sodium chloride 0.9% 250 mL IVPB       "Followed by" Linked Group Details   200 mg 580 mL/hr over 30 Minutes Intravenous Once 03/23/21 1302 03/23/21 1531      Inpatient Medications  Scheduled Meds: . amLODipine  10 mg Oral Daily  . atorvastatin  20 mg Oral Daily  . carvedilol  6.25 mg Oral  BID WC  . diclofenac Sodium  4 g Topical QID  . feeding supplement  237 mL Oral TID BM  . hydrALAZINE  100 mg Oral Q8H  . insulin aspart  0-5 Units Subcutaneous QHS  . insulin aspart  0-9 Units Subcutaneous TID WC  . lisinopril  40 mg Oral Daily  . metFORMIN  500 mg Oral Q breakfast  . multivitamin with minerals  1 tablet Oral Daily  . pantoprazole  40 mg Oral QHS  . polyethylene glycol  17 g Oral Daily  . senna-docusate  1 tablet Oral BID  . sertraline  50 mg Oral Daily  . tiZANidine  2 mg Oral QHS  . valproic acid  250 mg Oral BID   Continuous Infusions: . remdesivir 100 mg in NS 100 mL 100 mg (03/24/21 0939)   PRN Meds:.acetaminophen **OR** acetaminophen (TYLENOL) oral liquid 160 mg/5 mL **OR** acetaminophen, alum & mag hydroxide-simeth, hydrALAZINE, hydroxypropyl methylcellulose / hypromellose, labetalol, melatonin, ondansetron (ZOFRAN) IV   Time Spent in minutes  25  See all Orders from today for further details   Oren Binet M.D on 03/24/2021 at 10:29 AM  To page go to [email or web address] - use universal password  [facility] -  Office  [phone]    Objective:   Vitals:   03/23/21 2356 03/24/21 0416 03/24/21 0815 03/24/21 0929  BP: 122/77 136/82 (!) 142/79 125/83  Pulse: 79 72 75 80  Resp: 17 18 20    Temp: 100 F (37.8 C) 99.8 F (37.7 C) 99.1 F (37.3 C)   TempSrc: Oral Oral Oral   SpO2: 97% 96% 97%   Weight:      Height:        Wt Readings from Last 3 Encounters:  02/25/21 73.2 kg  02/10/21 73.9 kg  09/30/20 78.5 kg     Intake/Output Summary (Last 24 hours) at 03/24/2021 1029 Last data filed at 03/24/2021 [id number] Gross per 24 hour  Intake 360 ml  Output 702 ml  Net -342 ml     Physical Exam Gen Exam:Alert awake-not in any distress HEENT:atraumatic, normocephalic Chest: B/L clear to auscultation anteriorly CVS:S1S2 regular Abdomen:soft non tender, non distended Extremities:no edema Neurology: Left-sided hemiplegia. Skin: no rash    Data Review:    CBC Recent Labs  Lab 03/19/21 0252 03/24/21 0407  WBC 5.7 5.1  HGB 12.6* 13.1  HCT 38.3* 39.9  PLT 319 291  MCV 87.2 87.5  MCH 28.7 28.7  MCHC 32.9 32.8  RDW 12.1 12.4    Chemistries  Recent Labs  Lab 03/19/21 0252 03/24/21 0407  NA 133* 131*  K 3.9 4.0  CL 98 95*  CO2 28 27  GLUCOSE 99 94  BUN 14 15  CREATININE 0.99 0.95  CALCIUM 8.9 9.0  AST 25 49*  ALT 31 50*  ALKPHOS 38 43  BILITOT 0.5 0.6   ------------------------------------------------------------------------------------------------------------------ No results for input(s): CHOL, HDL, LDLCALC, TRIG, CHOLHDL, LDLDIRECT in the last 72 hours.  Lab Results  Component Value Date   HGBA1C 6.4 (H) 02/22/2021   ------------------------------------------------------------------------------------------------------------------ No results for input(s): TSH, T4TOTAL, T3FREE, THYROIDAB in the last 72 hours.  Invalid input(s): FREET3 ------------------------------------------------------------------------------------------------------------------ No results for input(s): VITAMINB12, FOLATE, FERRITIN, TIBC, IRON, RETICCTPCT in the last 72 hours.  Coagulation profile No results for input(s): INR, PROTIME in the last 168 hours.  Recent Labs    03/23/21 1616 03/24/21 0407  DDIMER <0.27 <0.27    Cardiac Enzymes No results for input(s): CKMB, TROPONINI, MYOGLOBIN in the last 168 hours.  Invalid input(s): CK ------------------------------------------------------------------------------------------------------------------ No results found for: BNP  Micro Results Recent Results (from the past 240 hour(s))  SARS Coronavirus 2 by RT PCR (hospital order, performed in [facility] hospital lab)     Status: Abnormal   Collection Time: 03/23/21 11:35 AM  Result Value Ref Range Status   SARS Coronavirus 2 POSITIVE (A) NEGATIVE Final    Comment: RESULT CALLED TO, READ BACK BY AND VERIFIED WITH: Murvin Natal RN, AT 1219 03/23/21 D. VANHOOK (NOTE) SARS-CoV-2 target nucleic acids are DETECTED  SARS-CoV-2 RNA is generally detectable in upper respiratory specimens  during the acute phase of infection.  Positive results are indicative  of the presence of the identified virus, but do not rule out bacterial infection or co-infection with other pathogens not detected by the test.  Clinical correlation with patient history and  other diagnostic information is necessary to determine patient infection status.  The expected result is negative.  Fact Sheet for Patients:   StrictlyIdeas.no   Fact Sheet for Healthcare Providers:   BankingDealers.co.za    This test is not yet approved or cleared by the [place] FDA and  has been authorized for detection and/or diagnosis of SARS-CoV-2 by FDA under an Emergency Use Authorization (EUA).  This EUA will remain in effect (meaning  this test can be used) for the duration of  the COVID-19 declaration under Section 564(b)(1) of the Act, 21 U.S.C. section 360-bbb-3(b)(1), unless the authorization is terminated or revoked sooner.  Performed at [facility], [place] [address], [place], [place] [address]     Radiology Reports CT HEAD WO CONTRAST  Result Date: 03/06/2021 CLINICAL DATA:  Fall EXAM: CT HEAD WITHOUT CONTRAST TECHNIQUE: Contiguous axial images were  obtained from the base of the skull through the vertex without intravenous contrast. COMPARISON:  03/04/2021 FINDINGS: Brain: Unchanged appearance of right basal ganglia intraparenchymal hematoma with 7 mm of leftward midline shift. No new site of hemorrhage. Unchanged size and configuration of the ventricles. Vascular: No hyperdense vessel or unexpected calcification. Skull: Normal. Negative for fracture or focal lesion. Sinuses/Orbits: No acute finding. Other: None. IMPRESSION: Unchanged appearance of right basal ganglia intraparenchymal hematoma  with 7 mm of leftward midline shift. Electronically Signed   By: Ulyses Jarred M.D.   On: 03/06/2021 03:35   CT HEAD WO CONTRAST  Result Date: 03/04/2021 CLINICAL DATA:  Follow-up intraparenchymal hemorrhage EXAM: CT HEAD WITHOUT CONTRAST TECHNIQUE: Contiguous axial images were obtained from the base of the skull through the vertex without intravenous contrast. COMPARISON:  03/03/2021 and multiple previous FINDINGS: Brain: Intraparenchymal hematoma with its epicenter in the right basal ganglia measures 5.9 x 3.8 x 4.4 cm, unchanged when measured using the same technique. Surrounding edema appears the same. Mass-effect with right-to-left midline shift of 8 mm appears the same. Compression of the right lateral ventricle. No evidence of frank ventricular trapping on the left. Elsewhere, chronic small-vessel ischemic changes are seen affecting the cerebral hemispheric white matter Vascular: There is atherosclerotic calcification of the major vessels at the base of the brain. Skull: Negative Sinuses/Orbits: Mucosal thickening and retention cysts in the maxillary sinuses. Orbits negative. Other: None IMPRESSION: No change. Intraparenchymal hematoma with its epicenter in the right basal ganglia measures 5.9 x 3.8 x 4.4 cm (volume = 52 cm^3), unchanged when measured using the same technique. Surrounding edema appears the same. Mass-effect with right-to-left midline shift of 8 mm appears the same. No evidence of frank ventricular trapping on the left. Electronically Signed   By: Nelson Chimes M.D.   On: 03/04/2021 15:26   CT HEAD WO CONTRAST  Result Date: 03/03/2021 CLINICAL DATA:  Intracranial hemorrhage follow up EXAM: CT HEAD WITHOUT CONTRAST TECHNIQUE: Contiguous axial images were obtained from the base of the skull through the vertex without intravenous contrast. COMPARISON:  Head CT 02/22/2021 FINDINGS: Brain: Intraparenchymal hematoma has expanded to 5.8 x 3.6 cm, previously 5.5 x 2.8 cm. Leftward midline shift  has also worsened and now measures 8 mm, previously 1 mm. The right lateral ventricle is effaced. Vascular: No hyperdense vessel or unexpected calcification. Skull: Normal. Negative for fracture or focal lesion. Sinuses/Orbits: Bilateral maxillary sinus mucosal thickening. Normal orbits. Other: None IMPRESSION: 1. Increased size of 5.8 x 3.6 cm right intraparenchymal hematoma with worsening leftward midline shift, now measuring 8 mm. 2. Effaced right lateral ventricle, which may become entrapped and predisposes to hydrocephalus. These results will be called to the ordering clinician or representative by the Radiologist Assistant, and communication documented in the PACS or [employer]. Electronically Signed   By: Ulyses Jarred M.D.   On: 03/03/2021 22:46   MR BRAIN W WO CONTRAST  Result Date: 02/22/2021 CLINICAL DATA:  Brain mass or lesion; presenting with ICH, evaluate for an underlying lesion. EXAM: MRI HEAD WITHOUT AND WITH CONTRAST TECHNIQUE: Multiplanar, multiecho pulse sequences of the brain and surrounding structures were obtained without and with intravenous contrast. CONTRAST:  26mL GADAVIST GADOBUTROL 1 MMOL/ML IV SOLN COMPARISON:  Noncontrast head CT 02/22/2021. CT angiogram head/neck 02/21/2021. Noncontrast head CT 02/21/2021. FINDINGS: Brain: Intermittently motion degraded examination. Most notably, there is moderate/severe motion degradation of the coronal T1 weighted postcontrast sequence and moderate/severe motion degradation of the sagittal T1 weighted postcontrast sequence. Cerebral volume is  normal. Redemonstrated acute parenchymal hemorrhage centered within the right basal ganglia. The parenchymal hemorrhage has slightly increased in size as compared to the head CT performed earlier today, now measuring 5.5 x 3.1 cm in transaxial dimensions (previously measuring 5.5 x 2.8 cm in transaxial dimensions). Unchanged surrounding edema. No appreciable masslike or nodular enhancement at the  hemorrhage site on the current exam. Mass effect has increased with increased partial effacement of the right lateral and third ventricles. 3 mm leftward midline shift measured at the level of the septum lucent has also slightly increased. There is a subcentimeter cortical infarct along the posterior aspect of the hemorrhage within the right temporoparietal cortex (series 3, image 31). Additional subcentimeter focus of diffusion weighted hyperintensity along the high posterior right frontal lobe (series 3, image 49) (series 4, image 12). Moderate multifocal T2/FLAIR hyperintensity within the cerebral white matter is nonspecific, but compatible with chronic small vessel ischemic disease. 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 No extra-axial fluid collection. No evidence of hydrocephalus. Vascular: Expected proximal arterial flow voids. Skull and upper cervical spine: No focal marrow lesion. Incompletely assessed upper cervical spondylosis. A C3-C4 posterior disc osteophyte contributes to at least mild spinal canal stenosis, contacting and mildly flattening the ventral spinal cord. Sinuses/Orbits: Visualized orbits show no acute finding. Trace right frontal and bilateral ethmoid sinus mucosal thickening. Small and moderate-sized bilateral maxillary sinus mucous retention cysts. IMPRESSION: Significant intermittent motion degradation, as described and most notably affecting the T1 weighted postcontrast imaging. An acute parenchymal hemorrhage centered within the right basal ganglia has slightly increased in size since the head CT performed earlier today, now measuring 5.5 x 3.1 cm in transaxial dimensions. No evidence of an underlying mass on the current exam. However, contrast-enhanced MRI follow-up is recommended once the hematoma involutes for further evaluation. Edema surrounding the hemorrhage is similar. However, there is increased mass effect with increased partial effacement of the right lateral and  third ventricles. 3 mm leftward midline shift measured at the level of the septum pellucidum has also slightly increased. Subcentimeter acute cortical infarct adjacent to the hemorrhage within the right temporoparietal cortex. Additional subcentimeter focus of apparent diffusion weighted hyperintensity along the posterior high right frontal lobe, which is favored to reflect artifact arising from the calvarium. However, an additional small acute infarct cannot be excluded. Moderate cerebral white matter chronic small vessel ischemic disease. 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 Paranasal sinus disease as described. Electronically Signed   By: Kellie Simmering DO   On: 02/22/2021 14:35   US Abdomen Complete  Result Date: 03/03/2021 CLINICAL DATA:  Abdominal pain. EXAM: ABDOMEN ULTRASOUND COMPLETE COMPARISON:  CT 09/30/2020. FINDINGS: Gallbladder: No gallstones or wall thickening visualized. No sonographic Murphy sign noted by sonographer. Common bile duct: Diameter: 2.5 mm Liver: No focal lesion identified. Within normal limits in parenchymal echogenicity. Portal vein is patent on color Doppler imaging with normal direction of blood flow towards the liver. IVC: No abnormality visualized. Pancreas: Visualized portion unremarkable. Spleen: Patient refused to complete exam spleen not imaged. Right Kidney: Length: 10.4 cm. Echogenicity within normal limits. No mass or hydronephrosis visualized. Left Kidney: Length: Patient refused to complete exam, left kidney not imaged. Abdominal aorta: No aneurysm visualized. Other findings: None. IMPRESSION: 1. Patient refused to complete exam. Spleen and left kidney not imaged. 2. No acute abnormality identified. No gallstones or biliary distention. Electronically Signed   By: [place]   On: 03/03/2021 11:35   DG Chest Port 1 View  Result  Date: 03/23/2021 CLINICAL DATA:  Upper respiratory infection with cough and congestion EXAM: PORTABLE CHEST 1 VIEW  COMPARISON:  None. FINDINGS: Patient is rotated. The heart size and mediastinal contours are within normal limits for technique. Both lungs are clear. No pleural effusion or pneumothorax. The visualized skeletal structures are unremarkable. Surgical clips overlie the left axilla. IMPRESSION: No acute process in the chest. Electronically Signed   By: Macy Mis M.D.   On: 03/23/2021 16:04   DG Abd 2 Views  Result Date: 03/02/2021 CLINICAL DATA:  Abdominal pain EXAM: ABDOMEN - 2 VIEW COMPARISON:  CT abdomen and pelvis September 30, 2020 FINDINGS: Supine and upright images were obtained. There is contrast throughout the colon. There is no bowel dilatation or air-fluid level to suggest bowel obstruction. No free air. No abnormal calcifications evident. Note that contrast in the colon could mask smaller calcifications. Lung bases clear. IMPRESSION: Contrast throughout colon. No bowel obstruction or free air. No abnormal calcifications; note that contrast in the colon could mask smaller calcifications. Lung bases clear. Electronically Signed   By: Lowella Grip III M.D.   On: 03/02/2021 13:58   ECHOCARDIOGRAM COMPLETE  Result Date: 02/22/2021    ECHOCARDIOGRAM REPORT   Patient Name:   Quavon Aungst Date of Exam: 02/22/2021 Medical Rec #:  [id number]        Height:       69.0 in Accession #:    [id number]       Weight:       163.0 lb Date of Birth:  July 11, 1961        BSA:          1.894 m Patient Age:    73 years         BP:           149/89 mmHg Patient Gender: M                HR:           76 bpm. Exam Location:  Inpatient Procedure: 2D Echo, Cardiac Doppler and Color Doppler Indications:    CVA  History:        Patient has no prior history of Echocardiogram examinations.                 Risk Factors:Hypertension and Current Smoker. Polysubstance                 abuse, noncompliance.  Sonographer:    Dustin Flock Referring Phys: [id number] [place]  1. Left ventricular ejection  fraction, by estimation, is 65 to 70%. The left ventricle has normal function. The left ventricle has no regional wall motion abnormalities. There is moderate concentric left ventricular hypertrophy. Left ventricular diastolic parameters are consistent with Grade I diastolic dysfunction (impaired relaxation).  2. Right ventricular systolic function is normal. The right ventricular size is normal. There is normal pulmonary artery systolic pressure.  3. Left atrial size was moderately dilated.  4. Right atrial size was severely dilated.  5. The mitral valve is normal in structure. Trivial mitral valve regurgitation. No evidence of mitral stenosis.  6. The aortic valve is tricuspid. Aortic valve regurgitation is not visualized. No aortic stenosis is present.  7. The inferior vena cava is normal in size with greater than 50% respiratory variability, suggesting right atrial pressure of 3 mmHg. FINDINGS  Left Ventricle: Left ventricular ejection fraction, by estimation, is 65 to 70%. The left ventricle has normal function. The left ventricle has no  regional wall motion abnormalities. The left ventricular internal cavity size was normal in size. There is  moderate concentric left ventricular hypertrophy. Left ventricular diastolic parameters are consistent with Grade I diastolic dysfunction (impaired relaxation). Normal left ventricular filling pressure. Right Ventricle: The right ventricular size is normal. No increase in right ventricular wall thickness. Right ventricular systolic function is normal. There is normal pulmonary artery systolic pressure. The tricuspid regurgitant velocity is 2.82 m/s, and  with an assumed right atrial pressure of 3 mmHg, the estimated right ventricular systolic pressure is [id number]. Left Atrium: Left atrial size was moderately dilated. Right Atrium: Right atrial size was severely dilated. Pericardium: There is no evidence of pericardial effusion. Mitral Valve: The mitral valve is normal in  structure. Trivial mitral valve regurgitation. No evidence of mitral valve stenosis. Tricuspid Valve: The tricuspid valve is normal in structure. Tricuspid valve regurgitation is mild . No evidence of tricuspid stenosis. Aortic Valve: The aortic valve is tricuspid. Aortic valve regurgitation is not visualized. No aortic stenosis is present. Pulmonic Valve: The pulmonic valve was normal in structure. Pulmonic valve regurgitation is not visualized. No evidence of pulmonic stenosis. Aorta: The aortic root is normal in size and structure. Venous: The inferior vena cava is normal in size with greater than 50% respiratory variability, suggesting right atrial pressure of 3 mmHg. IAS/Shunts: No atrial level shunt detected by color flow Doppler.  LEFT VENTRICLE PLAX 2D LVIDd:         4.80 cm  Diastology LVIDs:         3.70 cm  LV e' medial:    5.77 cm/s LV PW:         1.60 cm  LV E/e' medial:  8.4 LV IVS:        1.60 cm  LV e' lateral:   8.81 cm/s LVOT diam:     2.50 cm  LV E/e' lateral: 5.5 LV SV:         105 LV SV Index:   55 LVOT Area:     4.91 cm  RIGHT VENTRICLE RV Basal diam:  3.00 cm RV S prime:     28.30 cm/s TAPSE (M-mode): 3.1 cm LEFT ATRIUM             Index       RIGHT ATRIUM           Index LA diam:        2.90 cm 1.53 cm/m  RA Area:     23.30 cm LA Vol (A2C):   74.5 ml 39.34 ml/m RA Volume:   83.00 ml  43.82 ml/m LA Vol (A4C):   46.2 ml 24.39 ml/m LA Biplane Vol: 59.7 ml 31.52 ml/m  AORTIC VALVE LVOT Vmax:   125.00 cm/s LVOT Vmean:  76.200 cm/s LVOT VTI:    0.213 m  AORTA Ao Root diam: 3.40 cm MITRAL VALVE               TRICUSPID VALVE MV Area (PHT): 2.34 cm    TR Peak grad:   31.8 mmHg MV Decel Time: 324 msec    TR Vmax:        282.00 cm/s MV E velocity: 48.40 cm/s MV A velocity: 63.80 cm/s  SHUNTS MV E/A ratio:  0.76        Systemic VTI:  0.21 m  Systemic Diam: 2.50 cm Skeet Latch MD Electronically signed by Skeet Latch MD Signature Date/Time: 02/22/2021/2:41:44 PM     Final

## 2021-03-24 NOTE — Progress Notes (Signed)
CSW spoke with Tressa Busman at [facility] who states the facility can offer the patient a bed once he is out of COVID quarantine.  CSW spoke with patient's sister [name] to inform her of bed offer. CSW explained TOC efforts to obtain placement close to [place] but unfortunately those facilities were not agreeable to accept an [place] to review [facility] and return call to [facility].  Madilyn Fireman, MSW, LCSW Transitions of Care  Clinical Social Worker II [phone]

## 2021-03-25 LAB — COMPREHENSIVE METABOLIC PANEL
ALT: 43 U/L (ref 0–44)
AST: 44 U/L — ABNORMAL HIGH (ref 15–41)
Albumin: 2.8 g/dL — ABNORMAL LOW (ref 3.5–5.0)
Alkaline Phosphatase: 40 U/L (ref 38–126)
Anion gap: 9 (ref 5–15)
BUN: 15 mg/dL (ref 6–20)
CO2: 27 mmol/L (ref 22–32)
Calcium: 8.9 mg/dL (ref 8.9–10.3)
Chloride: 96 mmol/L — ABNORMAL LOW (ref 98–111)
Creatinine, Ser: 1.09 mg/dL (ref 0.61–1.24)
GFR, Estimated: >60 mL/min (ref >60)
Glucose, Bld: 91 mg/dL (ref 70–99)
Potassium: 3.8 mmol/L (ref 3.5–5.1)
Sodium: 132 mmol/L — ABNORMAL LOW (ref 135–145)
Total Bilirubin: 0.6 mg/dL (ref 0.3–1.2)
Total Protein: 6.1 g/dL — ABNORMAL LOW (ref 6.5–8.1)

## 2021-03-25 LAB — GLUCOSE, CAPILLARY
Glucose-Capillary: 118 mg/dL — ABNORMAL HIGH (ref 70–99)
Glucose-Capillary: 92 mg/dL (ref 70–99)
Glucose-Capillary: 94 mg/dL (ref 70–99)
Glucose-Capillary: 99 mg/dL (ref 70–99)

## 2021-03-25 NOTE — TOC Progression Note (Signed)
Transition of Care [facility]) - Progression Note    Patient Details  Name: Chad Coleman MRN: [id number] Date of Birth: 06-Oct-1961  Transition of Care [facility]) CM/SW [place], RN Phone Number: 03/25/2021, 8:34 AM  Clinical Narrative:    Case management spoke with the patient's sister, Chad Coleman, and she states that she is agreeable to transfer the patient to [facility] SNF once the patient is out of COVID isolation.  The patient's family is anxious to have him placed closer to [place], [place] once the patient's Medicaid is approved at a later date.  I explained that the MSW at the facility will assist the family and patient at that point to have the patient placed at a facility closer to home.  The family is agreeable and understands that the hospital is offering an LOG for placement in the meantime for appropriate care.  CM and MSW will continue to follow the patient for SNF placement at [facility] on 04/04/2021 after isolation from [place] is complete at facility request.   Expected Discharge Plan: [place] Barriers to Discharge: Continued Medical Work up,Inadequate or no insurance,Active Substance Use - Placement  Expected Discharge Plan and Services Expected Discharge Plan: [place] In-house Referral: Clinical Social Work,Chaplain Discharge Planning Services: CM Consult Post Acute Care Choice: [place] Living arrangements for the past 2 months: [place] Determinants of Health (SDOH) Interventions    Readmission Risk Interventions No flowsheet data found.

## 2021-03-25 NOTE — Progress Notes (Signed)
Physical Therapy Treatment Patient Details Name: Imanuel Coleman MRN: [id number] DOB: 01/04/1961 Today's Date: 03/25/2021    History of Present Illness 60 yo male presents to [facility] on 3/28 with acute onset of L weakness, numbness, and L facial droop. CTH R BG ICH, suspect hypertensive.  Fall on 02/28/21. imaging showing increased in R intraparenchymal hematoma with worsening leftward midline shift on 4/7. Tested postive for COVID on 4/27. PMH includes hypertension, tobacco use, noncompliance with antihypertensive medications, and cocaine abuse.    PT Comments    Patient progressing towards physical therapy goals. Patient initiating movement and compensatory strategies to assist with bed mobility. Patient maxA+2 for transfers with HHAx2. Patient continues to be limited by L sided weakness, impaired balance, and decreased activity tolerance. Continue to recommend SNF for ongoing Physical Therapy.       Follow Up Recommendations  Supervision/Assistance - 24 hour;SNF     Equipment Recommendations  Wheelchair (measurements PT);Wheelchair cushion (measurements PT)    Recommendations for Other Services       Precautions / Restrictions Precautions Precautions: Fall Precaution Comments: Pt with 2 in hospital falls. Pusher, mild L inattention, impulsive Restrictions Weight Bearing Restrictions: No    Mobility  Bed Mobility Overal bed mobility: Needs Assistance Bed Mobility: Supine to Sit;Sit to Supine     Supine to sit: Max assist;HOB elevated Sit to supine: Max assist   General bed mobility comments: maxA for all aspects of bed mobility. Patient able to initiate trunk elevation and use R LE to assist L LE towards EOB    Transfers Overall transfer level: Needs assistance Equipment used: 2 person hand held assist Transfers: Sit to/from [employer] Sit to Stand: Max assist;+2 physical assistance;+2 safety/equipment   Squat pivot transfers: Max assist;+2  safety/equipment     General transfer comment: maxA+2 to power up into standing from EOB with HHAx2 and L knee block. MaxA+2 for squat pivot bed<>BSC  Ambulation/Gait                 Stairs             Wheelchair Mobility    Modified Rankin (Stroke Patients Only) Modified Rankin (Stroke Patients Only) Pre-Morbid Rankin Score: No symptoms Modified Rankin: Severe disability     Balance Overall balance assessment: Needs assistance Sitting-balance support: Feet supported;Single extremity supported Sitting balance-Leahy Scale: Poor Sitting balance - Comments: min guard- maxA to maintain static sitting EOB with L lateral lean Postural control: Left lateral lean Standing balance support: Bilateral upper extremity supported;During functional activity Standing balance-Leahy Scale: Poor Standing balance comment: maxA+2 to maintain standing EOB                            Cognition Arousal/Alertness: Awake/alert Behavior During Therapy: Flat affect Overall Cognitive Status: Impaired/Different from baseline Area of Impairment: Safety/judgement;Attention;Awareness;Problem solving;Following commands                   Current Attention Level: Sustained   Following Commands: Follows one step commands with increased time;Follows one step commands inconsistently Safety/Judgement: Decreased awareness of safety;Decreased awareness of deficits Awareness: Emergent Problem Solving: Slow processing;Requires verbal cues;Requires tactile cues General Comments: cues required to attend to L side and finding midline      Exercises      General Comments        Pertinent Vitals/Pain Pain Assessment: No/denies pain    Home Living  Prior Function            PT Goals (current goals can now be found in the care plan section) Acute Rehab PT Goals Patient Stated Goal: to go home PT Goal Formulation: With patient Time For Goal  Achievement: 04/06/21 Potential to Achieve Goals: Good Progress towards PT goals: Progressing toward goals    Frequency    Min 3X/week      PT Plan Current plan remains appropriate    Co-evaluation              AM-PAC PT "6 Clicks" Mobility   Outcome Measure  Help needed turning from your back to your side while in a flat bed without using bedrails?: A Lot Help needed moving from lying on your back to sitting on the side of a flat bed without using bedrails?: A Lot Help needed moving to and from a bed to a chair (including a wheelchair)?: Total Help needed standing up from a chair using your arms (e.g., wheelchair or bedside chair)?: Total Help needed to walk in hospital room?: Total Help needed climbing 3-5 steps with a railing? : Total 6 Click Score: 8    End of Session Equipment Utilized During Treatment: Gait belt Activity Tolerance: Patient tolerated treatment well Patient left: in bed;with call bell/phone within reach;with bed alarm set Nurse Communication: Mobility status PT Visit Diagnosis: Hemiplegia and hemiparesis;Unsteadiness on feet (R26.81);Muscle weakness (generalized) (M62.81);Difficulty in walking, not elsewhere classified (R26.2);Other symptoms and signs involving the nervous system (R29.898) Hemiplegia - Right/Left: Left Hemiplegia - dominant/non-dominant: Dominant Hemiplegia - caused by: Nontraumatic intracerebral hemorrhage     Time: 6761-9509 PT Time Calculation (min) (ACUTE ONLY): 34 min  Charges:  $Therapeutic Activity: 23-37 mins                     Chad Coleman Chad Coleman PT, DPT Acute Rehabilitation Services Pager [phone] Office [phone]    Linna Hoff 03/25/2021, 1:07 PM

## 2021-03-25 NOTE — Progress Notes (Signed)
PROGRESS NOTE                                                                                                                                                                                                             Patient Demographics:    Chad Coleman, is a 60 y.o. male, DOB - [birth year], QQP:[id number]  Outpatient Primary MD for the patient is Patient, No Pcp Per (Inactive)   Admit date - 02/21/2021   LOS - 41  Chief Complaint  Patient presents with  . Weakness       Brief Narrative: Patient is a 60 y.o. 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  While in the hospital-patient sustained a mechanical fall-following which we he had worsening of his intraparenchymal hematoma-requiring transfer to the ICU-neurosurgery consulted.  Upon further stability-he was transferred back to telemetry.  Further hospital course was complicated by fever-and was found to have COVID-19 infection.  COVID-19 vaccinated status: Vaccinated but not boosted.   Subjective:   Lying comfortably in bed-left-sided deficits remain unchanged.   Assessment  & Plan :   COVID 19 infection: Now afebrile-no hypoxia-completely asymptomatic at this point-has completed 3 days of Remdesivir.  Apart from observation-no further therapy required.  We will plan on 10 days of isolation from the day of diagnosis.    ICH: Continues to have dense left-sided deficits-remains on a dysphagia 3 diet-continue PT/OT eval.  Social work following for placement.  Dysphagia: Secondary to above-SLP following-on dysphagia 3 diet  Hyponatremia: Mild-stable for monitoring without any further work-up.  Mild transaminitis: Likely due to COVID-19-stable for monitoring.  HTN: BP reasonable-continue  hydralazine, Norvasc, clonidine, Imdur and lisinopril.  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  Reviewed prior notes-neurology discussed with radiology-this appears to be a benign issue-imaging planned in the outpatient setting.  Cocaine/tobacco use: Counseled  ABG:    Component Value Date/Time  TCO2 29 02/21/2021 1737    Vent Settings: N/A  Condition - Stable  Family Communication  : None at bedside  Code Status :  Full Code  Diet :  Diet Order            Diet regular Room service appropriate? Yes; Fluid consistency: Thin  Diet effective now                  Disposition Plan  :   Status is: Inpatient  Remains inpatient appropriate because:Inpatient level of care appropriate due to severity of illness   Dispo: The patient is from: Home              Anticipated d/c is to: SNF              Patient currently is not medically stable to d/c.   Difficult to place patient No    Barriers to discharge: Awaiting SNF bed-being treated for acute COVID infection with IV Remdesivir.  Antimicorbials  :    Anti-infectives (From admission, onward)   Start     Dose/Rate Route Frequency Ordered Stop   03/24/21 1000  remdesivir 100 mg in sodium chloride 0.9 % 100 mL IVPB       "Followed by" Linked Group Details   100 mg 200 mL/hr over 30 Minutes Intravenous Daily 03/23/21 1302 03/25/21 1001   03/23/21 1400  remdesivir 200 mg in sodium chloride 0.9% 250 mL IVPB       "Followed by" Linked Group Details   200 mg 580 mL/hr over 30 Minutes Intravenous Once 03/23/21 1302 03/23/21 1531      Inpatient Medications  Scheduled Meds: . amLODipine  10 mg Oral Daily  .  atorvastatin  20 mg Oral Daily  . carvedilol  6.25 mg Oral BID WC  . diclofenac Sodium  4 g Topical QID  . feeding supplement  237 mL Oral TID BM  . hydrALAZINE  100 mg Oral Q8H  . insulin aspart  0-5 Units Subcutaneous QHS  . insulin aspart  0-9 Units Subcutaneous TID WC  . lisinopril  40 mg Oral Daily  . metFORMIN  500 mg Oral Q breakfast  . multivitamin with minerals  1 tablet Oral Daily  . pantoprazole  40 mg Oral QHS  . polyethylene glycol  17 g Oral Daily  . senna-docusate  1 tablet Oral BID  . sertraline  50 mg Oral Daily  . tiZANidine  2 mg Oral QHS  . valproic acid  250 mg Oral BID   Continuous Infusions:  PRN Meds:.acetaminophen **OR** acetaminophen (TYLENOL) oral liquid 160 mg/5 mL **OR** acetaminophen, alum & mag hydroxide-simeth, hydrALAZINE, hydroxypropyl methylcellulose / hypromellose, labetalol, melatonin, ondansetron (ZOFRAN) IV   Time Spent in minutes  15  See all Orders from today for further details   Oren Binet M.D on 03/25/2021 at 11:24 AM  To page go to [email or web address] - use universal password  [facility] -  Office  [phone]    Objective:   Vitals:   03/24/21 2200 03/25/21 0500 03/25/21 0822 03/25/21 0921  BP: 136/78 118/75 112/79 112/72  Pulse: 82 67 71 72  Resp: 20 18 20    Temp: 98.6 F (37 C) 98.4 F (36.9 C) 98.3 F (36.8 C)   TempSrc: Oral Oral Oral   SpO2: 97% 97% 96%   Weight:      Height:        Wt Readings from Last 3 Encounters:  02/25/21 73.2 kg  02/10/21 73.9  kg  09/30/20 78.5 kg     Intake/Output Summary (Last 24 hours) at 03/25/2021 1124 Last data filed at 03/25/2021 0600 Gross per 24 hour  Intake 400 ml  Output 500 ml  Net -100 ml     Physical Exam Gen Exam:Alert awake-not in any distress HEENT:atraumatic, normocephalic Chest: B/L clear to auscultation anteriorly CVS:S1S2 regular Abdomen:soft non tender, non distended Extremities:no edema Neurology: Left-sided hemiplegia. Skin: no rash   Data  Review:    CBC Recent Labs  Lab 03/19/21 0252 03/24/21 0407  WBC 5.7 5.1  HGB 12.6* 13.1  HCT 38.3* 39.9  PLT 319 291  MCV 87.2 87.5  MCH 28.7 28.7  MCHC 32.9 32.8  RDW 12.1 12.4    Chemistries  Recent Labs  Lab 03/19/21 0252 03/24/21 0407 03/25/21 0128  NA 133* 131* 132*  K 3.9 4.0 3.8  CL 98 95* 96*  CO2 28 27 27   GLUCOSE 99 94 91  BUN 14 15 15   CREATININE 0.99 0.95 1.09  CALCIUM 8.9 9.0 8.9  AST 25 49* 44*  ALT 31 50* 43  ALKPHOS 38 43 40  BILITOT 0.5 0.6 0.6   ------------------------------------------------------------------------------------------------------------------ No results for input(s): CHOL, HDL, LDLCALC, TRIG, CHOLHDL, LDLDIRECT in the last 72 hours.  Lab Results  Component Value Date   HGBA1C 6.4 (H) 02/22/2021   ------------------------------------------------------------------------------------------------------------------ No results for input(s): TSH, T4TOTAL, T3FREE, THYROIDAB in the last 72 hours.  Invalid input(s): FREET3 ------------------------------------------------------------------------------------------------------------------ No results for input(s): VITAMINB12, FOLATE, FERRITIN, TIBC, IRON, RETICCTPCT in the last 72 hours.  Coagulation profile No results for input(s): INR, PROTIME in the last 168 hours.  Recent Labs    03/23/21 1616 03/24/21 0407  DDIMER <0.27 <0.27    Cardiac Enzymes No results for input(s): CKMB, TROPONINI, MYOGLOBIN in the last 168 hours.  Invalid input(s): CK ------------------------------------------------------------------------------------------------------------------ No results found for: BNP  Micro Results Recent Results (from the past 240 hour(s))  SARS Coronavirus 2 by RT PCR (hospital order, performed in [place] hospital lab)     Status: Abnormal   Collection Time: 03/23/21 11:35 AM  Result Value Ref Range Status   SARS Coronavirus 2 POSITIVE (A) NEGATIVE Final    Comment:  RESULT CALLED TO, READ BACK BY AND VERIFIED WITH: Murvin Natal RN, AT 1219 03/23/21 D. VANHOOK (NOTE) SARS-CoV-2 target nucleic acids are DETECTED  SARS-CoV-2 RNA is generally detectable in upper respiratory specimens  during the acute phase of infection.  Positive results are indicative  of the presence of the identified virus, but do not rule out bacterial infection or co-infection with other pathogens not detected by the test.  Clinical correlation with patient history and  other diagnostic information is necessary to determine patient infection status.  The expected result is negative.  Fact Sheet for Patients:   StrictlyIdeas.no   Fact Sheet for Healthcare Providers:   BankingDealers.co.za    This test is not yet approved or cleared by the [place] FDA and  has been authorized for detection and/or diagnosis of SARS-CoV-2 by FDA under an Emergency Use Authorization (EUA).  This EUA will remain in effect (meaning  this test can be used) for the duration of  the COVID-19 declaration under Section 564(b)(1) of the Act, 21 U.S.C. section 360-bbb-3(b)(1), unless the authorization is terminated or revoked sooner.  Performed at [facility], [place] [address] [place], [place] [address]   Culture, Urine     Status: Abnormal (Preliminary result)   Collection Time: 03/23/21 12:09 PM  Specimen: Urine, Clean Catch  Result Value Ref Range Status   Specimen Description URINE, CLEAN CATCH  Final   Special Requests NONE  Final   Culture (A)  Final    40,000 COLONIES/mL PROTEUS MIRABILIS SUSCEPTIBILITIES TO FOLLOW Performed at [facility], [address] [place], [place] [address]    Report Status PENDING  Incomplete    Radiology Reports CT HEAD WO CONTRAST  Result Date: 03/06/2021 CLINICAL DATA:  Fall EXAM: CT HEAD WITHOUT CONTRAST TECHNIQUE: Contiguous axial images were obtained from the base of the skull through the  vertex without intravenous contrast. COMPARISON:  03/04/2021 FINDINGS: Brain: Unchanged appearance of right basal ganglia intraparenchymal hematoma with 7 mm of leftward midline shift. No new site of hemorrhage. Unchanged size and configuration of the ventricles. Vascular: No hyperdense vessel or unexpected calcification. Skull: Normal. Negative for fracture or focal lesion. Sinuses/Orbits: No acute finding. Other: None. IMPRESSION: Unchanged appearance of right basal ganglia intraparenchymal hematoma with 7 mm of leftward midline shift. Electronically Signed   By: Ulyses Jarred M.D.   On: 03/06/2021 03:35   CT HEAD WO CONTRAST  Result Date: 03/04/2021 CLINICAL DATA:  Follow-up intraparenchymal hemorrhage EXAM: CT HEAD WITHOUT CONTRAST TECHNIQUE: Contiguous axial images were obtained from the base of the skull through the vertex without intravenous contrast. COMPARISON:  03/03/2021 and multiple previous FINDINGS: Brain: Intraparenchymal hematoma with its epicenter in the right basal ganglia measures 5.9 x 3.8 x 4.4 cm, unchanged when measured using the same technique. Surrounding edema appears the same. Mass-effect with right-to-left midline shift of 8 mm appears the same. Compression of the right lateral ventricle. No evidence of frank ventricular trapping on the left. Elsewhere, chronic small-vessel ischemic changes are seen affecting the cerebral hemispheric white matter Vascular: There is atherosclerotic calcification of the major vessels at the base of the brain. Skull: Negative Sinuses/Orbits: Mucosal thickening and retention cysts in the maxillary sinuses. Orbits negative. Other: None IMPRESSION: No change. Intraparenchymal hematoma with its epicenter in the right basal ganglia measures 5.9 x 3.8 x 4.4 cm (volume = 52 cm^3), unchanged when measured using the same technique. Surrounding edema appears the same. Mass-effect with right-to-left midline shift of 8 mm appears the same. No evidence of frank  ventricular trapping on the left. Electronically Signed   By: Nelson Chimes M.D.   On: 03/04/2021 15:26   CT HEAD WO CONTRAST  Result Date: 03/03/2021 CLINICAL DATA:  Intracranial hemorrhage follow up EXAM: CT HEAD WITHOUT CONTRAST TECHNIQUE: Contiguous axial images were obtained from the base of the skull through the vertex without intravenous contrast. COMPARISON:  Head CT 02/22/2021 FINDINGS: Brain: Intraparenchymal hematoma has expanded to 5.8 x 3.6 cm, previously 5.5 x 2.8 cm. Leftward midline shift has also worsened and now measures 8 mm, previously 1 mm. The right lateral ventricle is effaced. Vascular: No hyperdense vessel or unexpected calcification. Skull: Normal. Negative for fracture or focal lesion. Sinuses/Orbits: Bilateral maxillary sinus mucosal thickening. Normal orbits. Other: None IMPRESSION: 1. Increased size of 5.8 x 3.6 cm right intraparenchymal hematoma with worsening leftward midline shift, now measuring 8 mm. 2. Effaced right lateral ventricle, which may become entrapped and predisposes to hydrocephalus. These results will be called to the ordering clinician or representative by the Radiologist Assistant, and communication documented in the PACS or [employer]. Electronically Signed   By: Ulyses Jarred M.D.   On: 03/03/2021 22:46   US Abdomen Complete  Result Date: 03/03/2021 CLINICAL DATA:  Abdominal pain. EXAM: ABDOMEN ULTRASOUND COMPLETE  COMPARISON:  CT 09/30/2020. FINDINGS: Gallbladder: No gallstones or wall thickening visualized. No sonographic Murphy sign noted by sonographer. Common bile duct: Diameter: 2.5 mm Liver: No focal lesion identified. Within normal limits in parenchymal echogenicity. Portal vein is patent on color Doppler imaging with normal direction of blood flow towards the liver. IVC: No abnormality visualized. Pancreas: Visualized portion unremarkable. Spleen: Patient refused to complete exam spleen not imaged. Right Kidney: Length: 10.4 cm. Echogenicity  within normal limits. No mass or hydronephrosis visualized. Left Kidney: Length: Patient refused to complete exam, left kidney not imaged. Abdominal aorta: No aneurysm visualized. Other findings: None. IMPRESSION: 1. Patient refused to complete exam. Spleen and left kidney not imaged. 2. No acute abnormality identified. No gallstones or biliary distention. Electronically Signed   By: Marcello Moores  Register   On: 03/03/2021 11:35   DG Chest Port 1 View  Result Date: 03/23/2021 CLINICAL DATA:  Upper respiratory infection with cough and congestion EXAM: PORTABLE CHEST 1 VIEW COMPARISON:  None. FINDINGS: Patient is rotated. The heart size and mediastinal contours are within normal limits for technique. Both lungs are clear. No pleural effusion or pneumothorax. The visualized skeletal structures are unremarkable. Surgical clips overlie the left axilla. IMPRESSION: No acute process in the chest. Electronically Signed   By: Macy Mis M.D.   On: 03/23/2021 16:04   DG Abd 2 Views  Result Date: 03/02/2021 CLINICAL DATA:  Abdominal pain EXAM: ABDOMEN - 2 VIEW COMPARISON:  CT abdomen and pelvis September 30, 2020 FINDINGS: Supine and upright images were obtained. There is contrast throughout the colon. There is no bowel dilatation or air-fluid level to suggest bowel obstruction. No free air. No abnormal calcifications evident. Note that contrast in the colon could mask smaller calcifications. Lung bases clear. IMPRESSION: Contrast throughout colon. No bowel obstruction or free air. No abnormal calcifications; note that contrast in the colon could mask smaller calcifications. Lung bases clear. Electronically Signed   By: Lowella Grip III M.D.   On: 03/02/2021 13:58

## 2021-03-25 NOTE — Progress Notes (Signed)
Left AD with nurse for patient. Pt. Will have chaplain page if needed.  Jaclynn Major, [place], [facility], Pager [phone]

## 2021-03-25 NOTE — Progress Notes (Signed)
  Speech Language Pathology Treatment: Dysphagia  Patient Details Name: Jacobi Nile MRN: [id number] DOB: [birth year] Today's Date: 03/25/2021 Time: [id number] SLP Time Calculation (min) (ACUTE ONLY): 20 min  Assessment / Plan / Recommendation Clinical Impression  Pt reclined in bed, feeding himself a [place] burger with crumbs/chunks of food all over bed, chest, and left side of mouth.  Repositioned upright; provided pt with wet washcloth with verbal cues needed to attend to left side of mouth and face.  Pt continues to require intermittent verbal cues for pocketing. He verbalized the need to use his tongue/finger for lingual sweep for oral clearance and carried it out independently.  Consumed thin liquids without difficulty; there are no s/s of aspiration. Continue regular solids, thin liquids. Intermittent supervision is adequate, but pt needs help with tray set-up and positioning before leaving him alone.   HPI HPI: 60 y.o. male with a medical history significant for hypertension, tobacco use, noncompliance with antihypertensive medications, and cocaine abuse who presented to the ED via EMS as a Code Stroke for evaluation of acute onset of left-sided weakness, numbness, and left facial droop. Dx right basal ganglia ICH. Evening of 4/7 pt fell from bed with CT revealing Increased size of 5.8 x 3.6 cm right intraparenchymal hematoma  with worsening leftward midline shift and eval. ST f/u for cognition/diet tolerance      SLP Plan  Continue with current plan of care       Recommendations  Diet recommendations: Regular;Thin liquid Liquids provided via: Straw Supervision: Intermittent supervision to cue for compensatory strategies Compensations: Lingual sweep for clearance of pocketing;Small sips/bites;Slow rate;Minimize environmental distractions;Follow solids with liquid                Oral Care Recommendations: Oral care BID Follow up Recommendations: Skilled Nursing facility SLP  Visit Diagnosis: Dysphagia, oral phase (R13.11) Plan: Continue with current plan of care       GO               Royden Bulman L. Tivis Ringer, [place] Office number [phone] Pager [phone]  Assunta Curtis 03/25/2021, 5:39 PM

## 2021-03-26 LAB — URINE CULTURE: Culture: 40000 — AB

## 2021-03-26 LAB — GLUCOSE, CAPILLARY
Glucose-Capillary: 108 mg/dL — ABNORMAL HIGH (ref 70–99)
Glucose-Capillary: 97 mg/dL (ref 70–99)
Glucose-Capillary: 120 mg/dL — ABNORMAL HIGH (ref 70–99)
Glucose-Capillary: 118 mg/dL — ABNORMAL HIGH (ref 70–99)
Glucose-Capillary: 98 mg/dL (ref 70–99)

## 2021-03-26 NOTE — Progress Notes (Signed)
PROGRESS NOTE                                                                                                                                                                                                             Patient Demographics:    Chad Coleman, is a 60 y.o. male, DOB - 08-12-1961, ZOX:[id number]  Outpatient Primary MD for the patient is Patient, No Pcp Per (Inactive)   Admit date - 02/21/2021   LOS - 81  Chief Complaint  Patient presents with  . Weakness       Brief Narrative: Patient is a 60 y.o. 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  While in the hospital-patient sustained a mechanical fall-following which we he had worsening of his intraparenchymal hematoma-requiring transfer to the ICU-neurosurgery consulted.  Upon further stability-he was transferred back to telemetry.  Further hospital course was complicated by fever-and was found to have COVID-19 infection.  COVID-19 vaccinated status: Vaccinated but not boosted.   Subjective:   Afebrile-denies any chest pain/shortness of breath-continues to have dense left-sided hemiplegia.   Assessment  & Plan :   COVID 19 infection: Now afebrile-no hypoxia-completely asymptomatic at this point-has completed 3 days of Remdesivir.  Apart from observation-no further therapy required.  We will plan on 10 days of isolation from the day of diagnosis.    ICH: Continues to have dense left-sided deficits-remains on a dysphagia 3 diet-continue PT/OT eval.  Social work following for placement.  Dysphagia: Secondary to above-SLP following-on dysphagia 3 diet  Hyponatremia: Mild-stable for monitoring without any further work-up.  Mild transaminitis: Likely due to COVID-19-stable for  monitoring.  HTN: BP reasonable-continue hydralazine, Norvasc, clonidine, Imdur and lisinopril.  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  Reviewed prior notes-neurology discussed with radiology-this appears to be a benign issue-imaging planned in the outpatient setting.  Cocaine/tobacco use: Counseled  ABG:  Component Value Date/Time   TCO2 29 02/21/2021 1737    Vent Settings: N/A  Condition - Stable  Family Communication  : None at bedside  Code Status :  Full Code  Diet :  Diet Order            Diet regular Room service appropriate? Yes; Fluid consistency: Thin  Diet effective now                  Disposition Plan  :   Status is: Inpatient  Remains inpatient appropriate because:Inpatient level of care appropriate due to severity of illness   Dispo: The patient is from: Home              Anticipated d/c is to: SNF              Patient currently is not medically stable to d/c.   Difficult to place patient No    Barriers to discharge: Awaiting SNF bed-being treated for acute COVID infection with IV Remdesivir.  Antimicorbials  :    Anti-infectives (From admission, onward)   Start     Dose/Rate Route Frequency Ordered Stop   03/24/21 1000  remdesivir 100 mg in sodium chloride 0.9 % 100 mL IVPB       "Followed by" Linked Group Details   100 mg 200 mL/hr over 30 Minutes Intravenous Daily 03/23/21 1302 03/25/21 1001   03/23/21 1400  remdesivir 200 mg in sodium chloride 0.9% 250 mL IVPB       "Followed by" Linked Group Details   200 mg 580 mL/hr over 30 Minutes Intravenous Once 03/23/21 1302 03/23/21 1531      Inpatient Medications  Scheduled  Meds: . amLODipine  10 mg Oral Daily  . atorvastatin  20 mg Oral Daily  . carvedilol  6.25 mg Oral BID WC  . diclofenac Sodium  4 g Topical QID  . feeding supplement  237 mL Oral TID BM  . hydrALAZINE  100 mg Oral Q8H  . insulin aspart  0-5 Units Subcutaneous QHS  . insulin aspart  0-9 Units Subcutaneous TID WC  . lisinopril  40 mg Oral Daily  . metFORMIN  500 mg Oral Q breakfast  . multivitamin with minerals  1 tablet Oral Daily  . pantoprazole  40 mg Oral QHS  . polyethylene glycol  17 g Oral Daily  . senna-docusate  1 tablet Oral BID  . sertraline  50 mg Oral Daily  . tiZANidine  2 mg Oral QHS  . valproic acid  250 mg Oral BID   Continuous Infusions:  PRN Meds:.acetaminophen **OR** acetaminophen (TYLENOL) oral liquid 160 mg/5 mL **OR** acetaminophen, alum & mag hydroxide-simeth, hydrALAZINE, hydroxypropyl methylcellulose / hypromellose, labetalol, melatonin, ondansetron (ZOFRAN) IV   Time Spent in minutes  15  See all Orders from today for further details   Oren Binet M.D on 03/26/2021 at 10:29 AM  To page go to [email or web address] - use universal password  [facility] -  Office  [phone]    Objective:   Vitals:   03/25/21 2000 03/26/21 0029 03/26/21 0406 03/26/21 0828  BP: 113/71 93/65 118/77 117/74  Pulse: 74 70 63 69  Resp: 18 16 16 17   Temp: 98.3 F (36.8 C) 98.4 F (36.9 C) 98.3 F (36.8 C) 97.9 F (36.6 C)  TempSrc: Oral Oral Oral Oral  SpO2: 98% 98% 98% 96%  Weight:      Height:        Wt Readings from Last 3  Encounters:  02/25/21 73.2 kg  02/10/21 73.9 kg  09/30/20 78.5 kg     Intake/Output Summary (Last 24 hours) at 03/26/2021 1029 Last data filed at 03/25/2021 2100 Gross per 24 hour  Intake --  Output 800 ml  Net -800 ml     Physical Exam Gen Exam:Alert awake-not in any distress HEENT:atraumatic, normocephalic Chest: B/L clear to auscultation anteriorly CVS:S1S2 regular Abdomen:soft non tender, non distended Extremities:no  edema Neurology: Left-sided hemiplegia. Skin: no rash   Data Review:    CBC Recent Labs  Lab 03/24/21 0407  WBC 5.1  HGB 13.1  HCT 39.9  PLT 291  MCV 87.5  MCH 28.7  MCHC 32.8  RDW 12.4    Chemistries  Recent Labs  Lab 03/24/21 0407 03/25/21 0128  NA 131* 132*  K 4.0 3.8  CL 95* 96*  CO2 27 27  GLUCOSE 94 91  BUN 15 15  CREATININE 0.95 1.09  CALCIUM 9.0 8.9  AST 49* 44*  ALT 50* 43  ALKPHOS 43 40  BILITOT 0.6 0.6   ------------------------------------------------------------------------------------------------------------------ No results for input(s): CHOL, HDL, LDLCALC, TRIG, CHOLHDL, LDLDIRECT in the last 72 hours.  Lab Results  Component Value Date   HGBA1C 6.4 (H) 02/22/2021   ------------------------------------------------------------------------------------------------------------------ No results for input(s): TSH, T4TOTAL, T3FREE, THYROIDAB in the last 72 hours.  Invalid input(s): FREET3 ------------------------------------------------------------------------------------------------------------------ No results for input(s): VITAMINB12, FOLATE, FERRITIN, TIBC, IRON, RETICCTPCT in the last 72 hours.  Coagulation profile No results for input(s): INR, PROTIME in the last 168 hours.  Recent Labs    03/23/21 1616 03/24/21 0407  DDIMER <0.27 <0.27    Cardiac Enzymes No results for input(s): CKMB, TROPONINI, MYOGLOBIN in the last 168 hours.  Invalid input(s): CK ------------------------------------------------------------------------------------------------------------------ No results found for: BNP  Micro Results Recent Results (from the past 240 hour(s))  SARS Coronavirus 2 by RT PCR (hospital order, performed in [place] hospital lab)     Status: Abnormal   Collection Time: 03/23/21 11:35 AM  Result Value Ref Range Status   SARS Coronavirus 2 POSITIVE (A) NEGATIVE Final    Comment: RESULT CALLED TO, READ BACK BY AND VERIFIED  WITH: Murvin Natal RN, AT 1219 03/23/21 D. VANHOOK (NOTE) SARS-CoV-2 target nucleic acids are DETECTED  SARS-CoV-2 RNA is generally detectable in upper respiratory specimens  during the acute phase of infection.  Positive results are indicative  of the presence of the identified virus, but do not rule out bacterial infection or co-infection with other pathogens not detected by the test.  Clinical correlation with patient history and  other diagnostic information is necessary to determine patient infection status.  The expected result is negative.  Fact Sheet for Patients:   StrictlyIdeas.no   Fact Sheet for Healthcare Providers:   BankingDealers.co.za    This test is not yet approved or cleared by the [place] FDA and  has been authorized for detection and/or diagnosis of SARS-CoV-2 by FDA under an Emergency Use Authorization (EUA).  This EUA will remain in effect (meaning  this test can be used) for the duration of  the COVID-19 declaration under Section 564(b)(1) of the Act, 21 U.S.C. section 360-bbb-3(b)(1), unless the authorization is terminated or revoked sooner.  Performed at [facility], [place] [address], [place], [place] [address]   Culture, Urine     Status: Abnormal (Preliminary result)   Collection Time: 03/23/21 12:09 PM   Specimen: Urine, Clean Catch  Result Value Ref Range Status   Specimen Description URINE, CLEAN  CATCH  Final   Special Requests NONE  Final   Culture (A)  Final    40,000 COLONIES/mL PROTEUS MIRABILIS REPEATING SENSITIVITY Performed at [facility], [place] [address], [place], [place] [address]    Report Status PENDING  Incomplete    Radiology Reports CT HEAD WO CONTRAST  Result Date: 03/06/2021 CLINICAL DATA:  Fall EXAM: CT HEAD WITHOUT CONTRAST TECHNIQUE: Contiguous axial images were obtained from the base of the skull through the vertex without intravenous contrast. COMPARISON:   03/04/2021 FINDINGS: Brain: Unchanged appearance of right basal ganglia intraparenchymal hematoma with 7 mm of leftward midline shift. No new site of hemorrhage. Unchanged size and configuration of the ventricles. Vascular: No hyperdense vessel or unexpected calcification. Skull: Normal. Negative for fracture or focal lesion. Sinuses/Orbits: No acute finding. Other: None. IMPRESSION: Unchanged appearance of right basal ganglia intraparenchymal hematoma with 7 mm of leftward midline shift. Electronically Signed   By: Ulyses Jarred M.D.   On: 03/06/2021 03:35   CT HEAD WO CONTRAST  Result Date: 03/04/2021 CLINICAL DATA:  Follow-up intraparenchymal hemorrhage EXAM: CT HEAD WITHOUT CONTRAST TECHNIQUE: Contiguous axial images were obtained from the base of the skull through the vertex without intravenous contrast. COMPARISON:  03/03/2021 and multiple previous FINDINGS: Brain: Intraparenchymal hematoma with its epicenter in the right basal ganglia measures 5.9 x 3.8 x 4.4 cm, unchanged when measured using the same technique. Surrounding edema appears the same. Mass-effect with right-to-left midline shift of 8 mm appears the same. Compression of the right lateral ventricle. No evidence of frank ventricular trapping on the left. Elsewhere, chronic small-vessel ischemic changes are seen affecting the cerebral hemispheric white matter Vascular: There is atherosclerotic calcification of the major vessels at the base of the brain. Skull: Negative Sinuses/Orbits: Mucosal thickening and retention cysts in the maxillary sinuses. Orbits negative. Other: None IMPRESSION: No change. Intraparenchymal hematoma with its epicenter in the right basal ganglia measures 5.9 x 3.8 x 4.4 cm (volume = 52 cm^3), unchanged when measured using the same technique. Surrounding edema appears the same. Mass-effect with right-to-left midline shift of 8 mm appears the same. No evidence of frank ventricular trapping on the left. Electronically Signed    By: Nelson Chimes M.D.   On: 03/04/2021 15:26   CT HEAD WO CONTRAST  Result Date: 03/03/2021 CLINICAL DATA:  Intracranial hemorrhage follow up EXAM: CT HEAD WITHOUT CONTRAST TECHNIQUE: Contiguous axial images were obtained from the base of the skull through the vertex without intravenous contrast. COMPARISON:  Head CT 02/22/2021 FINDINGS: Brain: Intraparenchymal hematoma has expanded to 5.8 x 3.6 cm, previously 5.5 x 2.8 cm. Leftward midline shift has also worsened and now measures 8 mm, previously 1 mm. The right lateral ventricle is effaced. Vascular: No hyperdense vessel or unexpected calcification. Skull: Normal. Negative for fracture or focal lesion. Sinuses/Orbits: Bilateral maxillary sinus mucosal thickening. Normal orbits. Other: None IMPRESSION: 1. Increased size of 5.8 x 3.6 cm right intraparenchymal hematoma with worsening leftward midline shift, now measuring 8 mm. 2. Effaced right lateral ventricle, which may become entrapped and predisposes to hydrocephalus. These results will be called to the ordering clinician or representative by the Radiologist Assistant, and communication documented in the PACS or [employer]. Electronically Signed   By: Ulyses Jarred M.D.   On: 03/03/2021 22:46   US Abdomen Complete  Result Date: 03/03/2021 CLINICAL DATA:  Abdominal pain. EXAM: ABDOMEN ULTRASOUND COMPLETE COMPARISON:  CT 09/30/2020. FINDINGS: Gallbladder: No gallstones or wall thickening visualized. No sonographic Murphy sign noted  by sonographer. Common bile duct: Diameter: 2.5 mm Liver: No focal lesion identified. Within normal limits in parenchymal echogenicity. Portal vein is patent on color Doppler imaging with normal direction of blood flow towards the liver. IVC: No abnormality visualized. Pancreas: Visualized portion unremarkable. Spleen: Patient refused to complete exam spleen not imaged. Right Kidney: Length: 10.4 cm. Echogenicity within normal limits. No mass or hydronephrosis visualized.  Left Kidney: Length: Patient refused to complete exam, left kidney not imaged. Abdominal aorta: No aneurysm visualized. Other findings: None. IMPRESSION: 1. Patient refused to complete exam. Spleen and left kidney not imaged. 2. No acute abnormality identified. No gallstones or biliary distention. Electronically Signed   By: Marcello Moores  Register   On: 03/03/2021 11:35   DG Chest Port 1 View  Result Date: 03/23/2021 CLINICAL DATA:  Upper respiratory infection with cough and congestion EXAM: PORTABLE CHEST 1 VIEW COMPARISON:  None. FINDINGS: Patient is rotated. The heart size and mediastinal contours are within normal limits for technique. Both lungs are clear. No pleural effusion or pneumothorax. The visualized skeletal structures are unremarkable. Surgical clips overlie the left axilla. IMPRESSION: No acute process in the chest. Electronically Signed   By: Macy Mis M.D.   On: 03/23/2021 16:04   DG Abd 2 Views  Result Date: 03/02/2021 CLINICAL DATA:  Abdominal pain EXAM: ABDOMEN - 2 VIEW COMPARISON:  CT abdomen and pelvis September 30, 2020 FINDINGS: Supine and upright images were obtained. There is contrast throughout the colon. There is no bowel dilatation or air-fluid level to suggest bowel obstruction. No free air. No abnormal calcifications evident. Note that contrast in the colon could mask smaller calcifications. Lung bases clear. IMPRESSION: Contrast throughout colon. No bowel obstruction or free air. No abnormal calcifications; note that contrast in the colon could mask smaller calcifications. Lung bases clear. Electronically Signed   By: Lowella Grip III M.D.   On: 03/02/2021 13:58

## 2021-03-27 LAB — GLUCOSE, CAPILLARY
Glucose-Capillary: 91 mg/dL (ref 70–99)
Glucose-Capillary: 104 mg/dL — ABNORMAL HIGH (ref 70–99)
Glucose-Capillary: 129 mg/dL — ABNORMAL HIGH (ref 70–99)
Glucose-Capillary: 87 mg/dL (ref 70–99)
Glucose-Capillary: 110 mg/dL — ABNORMAL HIGH (ref 70–99)

## 2021-03-27 MED ORDER — PANTOPRAZOLE SODIUM 40 MG PO TBEC
40.0000 mg | DELAYED_RELEASE_TABLET | Freq: Two times a day (BID) | ORAL | Status: DC
  Administered 2021-03-27 – 2021-04-13 (×35): 40 mg via ORAL
  Filled 2021-03-27 (×35): qty 1

## 2021-03-27 MED ORDER — ALUM & MAG HYDROXIDE-SIMETH 200-200-20 MG/5ML PO SUSP
30.0000 mL | Freq: Four times a day (QID) | ORAL | Status: DC | PRN
  Administered 2021-03-27: 30 mL via ORAL
  Filled 2021-03-27: qty 30

## 2021-03-27 NOTE — Progress Notes (Signed)
PROGRESS NOTE                                                                                                                                                                                                             Patient Demographics:    Chad Coleman, is a 60 y.o. male, DOB - 1961-01-14, XBD:[id number]  Outpatient Primary MD for the patient is Patient, No Pcp Per (Inactive)   Admit date - 02/21/2021   LOS - 70  Chief Complaint  Patient presents with  . Weakness       Brief Narrative: Patient is a 60 y.o. 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  While in the hospital-patient sustained a mechanical fall-following which we he had worsening of his intraparenchymal hematoma-requiring transfer to the ICU-neurosurgery consulted.  Upon further stability-he was transferred back to telemetry.  Further hospital course was complicated by fever-and was found to have COVID-19 infection.  COVID-19 vaccinated status: Vaccinated but not boosted.   Subjective:   Some mild upper epigastric discomfort-no major issues overnight.   Assessment  & Plan :   COVID 19 infection: Now afebrile-no hypoxia-completely asymptomatic at this point-has completed 3 days of Remdesivir.  Apart from observation-no further therapy required.  We will plan on 10 days of isolation from the day of diagnosis.    ICH: Continues to have dense left-sided deficits-remains on a dysphagia 3 diet-continue PT/OT eval.  Social work following for placement.  Dysphagia: Secondary to above-SLP following-on dysphagia 3 diet  Hyponatremia: Mild-stable for monitoring without any further work-up.  Mild transaminitis: Likely due to COVID-19-stable for monitoring.  HTN: BP reasonable-continue  hydralazine, Norvasc, clonidine, Imdur and lisinopril.  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  Reviewed prior notes-neurology discussed with radiology-this appears to be a benign issue-imaging planned in the outpatient setting.  GERD: Change PPI to twice daily as he continues to  have epigastric pain-add prn Maalox for further discomfort.  Cocaine/tobacco use: Counseled  ABG:    Component Value Date/Time   TCO2 29 02/21/2021 1737    Vent Settings: N/A  Condition - Stable  Family Communication  : None at bedside  Code Status :  Full Code  Diet :  Diet Order            Diet regular Room service appropriate? Yes; Fluid consistency: Thin  Diet effective now                  Disposition Plan  :   Status is: Inpatient  Remains inpatient appropriate because:Inpatient level of care appropriate due to severity of illness   Dispo: The patient is from: Home              Anticipated d/c is to: SNF              Patient currently is not medically stable to d/c.   Difficult to place patient No    Barriers to discharge: Awaiting SNF bed-being treated for acute COVID infection with IV Remdesivir.  Antimicorbials  :    Anti-infectives (From admission, onward)   Start     Dose/Rate Route Frequency Ordered Stop   03/24/21 1000  remdesivir 100 mg in sodium chloride 0.9 % 100 mL IVPB       "Followed by" Linked Group Details   100 mg 200 mL/hr over 30 Minutes Intravenous Daily 03/23/21 1302 03/25/21 1001   03/23/21 1400  remdesivir 200 mg in sodium chloride 0.9% 250 mL IVPB       "Followed by" Linked Group Details   200 mg 580 mL/hr over 30 Minutes Intravenous Once  03/23/21 1302 03/23/21 1531      Inpatient Medications  Scheduled Meds: . amLODipine  10 mg Oral Daily  . atorvastatin  20 mg Oral Daily  . carvedilol  6.25 mg Oral BID WC  . diclofenac Sodium  4 g Topical QID  . feeding supplement  237 mL Oral TID BM  . hydrALAZINE  100 mg Oral Q8H  . insulin aspart  0-5 Units Subcutaneous QHS  . insulin aspart  0-9 Units Subcutaneous TID WC  . lisinopril  40 mg Oral Daily  . metFORMIN  500 mg Oral Q breakfast  . multivitamin with minerals  1 tablet Oral Daily  . pantoprazole  40 mg Oral BID  . polyethylene glycol  17 g Oral Daily  . senna-docusate  1 tablet Oral BID  . sertraline  50 mg Oral Daily  . tiZANidine  2 mg Oral QHS  . valproic acid  250 mg Oral BID   Continuous Infusions:  PRN Meds:.acetaminophen **OR** acetaminophen (TYLENOL) oral liquid 160 mg/5 mL **OR** acetaminophen, alum & mag hydroxide-simeth, hydrALAZINE, hydroxypropyl methylcellulose / hypromellose, labetalol, melatonin, ondansetron (ZOFRAN) IV   Time Spent in minutes  15  See all Orders from today for further details   Oren Binet M.D on 03/27/2021 at 11:36 AM  To page go to [email or web address] - use universal password  [facility] -  Office  [phone]    Objective:   Vitals:   03/27/21 0311 03/27/21 0649 03/27/21 0751 03/27/21 1002  BP: 101/69 116/76 94/61 127/80  Pulse: 63  (!) 58 61  Resp: 17   17  Temp: 98.2 F (36.8 C)  98.5 F (36.9 C)   TempSrc: Oral  Oral   SpO2: 99%  98%   Weight:      Height:  Wt Readings from Last 3 Encounters:  02/25/21 73.2 kg  02/10/21 73.9 kg  09/30/20 78.5 kg    No intake or output data in the 24 hours ending 03/27/21 1136   Physical Exam Gen Exam:Alert awake-not in any distress HEENT:atraumatic, normocephalic Chest: B/L clear to auscultation anteriorly CVS:S1S2 regular Abdomen:soft non tender, non distended Extremities:no edema Neurology: Left-sided hemiplegia. Skin: no rash   Data Review:     CBC Recent Labs  Lab 03/24/21 0407  WBC 5.1  HGB 13.1  HCT 39.9  PLT 291  MCV 87.5  MCH 28.7  MCHC 32.8  RDW 12.4    Chemistries  Recent Labs  Lab 03/24/21 0407 03/25/21 0128  NA 131* 132*  K 4.0 3.8  CL 95* 96*  CO2 27 27  GLUCOSE 94 91  BUN 15 15  CREATININE 0.95 1.09  CALCIUM 9.0 8.9  AST 49* 44*  ALT 50* 43  ALKPHOS 43 40  BILITOT 0.6 0.6   ------------------------------------------------------------------------------------------------------------------ No results for input(s): CHOL, HDL, LDLCALC, TRIG, CHOLHDL, LDLDIRECT in the last 72 hours.  Lab Results  Component Value Date   HGBA1C 6.4 (H) 02/22/2021   ------------------------------------------------------------------------------------------------------------------ No results for input(s): TSH, T4TOTAL, T3FREE, THYROIDAB in the last 72 hours.  Invalid input(s): FREET3 ------------------------------------------------------------------------------------------------------------------ No results for input(s): VITAMINB12, FOLATE, FERRITIN, TIBC, IRON, RETICCTPCT in the last 72 hours.  Coagulation profile No results for input(s): INR, PROTIME in the last 168 hours.  No results for input(s): DDIMER in the last 72 hours.  Cardiac Enzymes No results for input(s): CKMB, TROPONINI, MYOGLOBIN in the last 168 hours.  Invalid input(s): CK ------------------------------------------------------------------------------------------------------------------ No results found for: BNP  Micro Results Recent Results (from the past 240 hour(s))  SARS Coronavirus 2 by RT PCR (hospital order, performed in [place] hospital lab)     Status: Abnormal   Collection Time: 03/23/21 11:35 AM  Result Value Ref Range Status   SARS Coronavirus 2 POSITIVE (A) NEGATIVE Final    Comment: RESULT CALLED TO, READ BACK BY AND VERIFIED WITH: Murvin Natal RN, AT 1219 03/23/21 D. VANHOOK (NOTE) SARS-CoV-2 target nucleic acids are  DETECTED  SARS-CoV-2 RNA is generally detectable in upper respiratory specimens  during the acute phase of infection.  Positive results are indicative  of the presence of the identified virus, but do not rule out bacterial infection or co-infection with other pathogens not detected by the test.  Clinical correlation with patient history and  other diagnostic information is necessary to determine patient infection status.  The expected result is negative.  Fact Sheet for Patients:   StrictlyIdeas.no   Fact Sheet for Healthcare Providers:   BankingDealers.co.za    This test is not yet approved or cleared by the [place] FDA and  has been authorized for detection and/or diagnosis of SARS-CoV-2 by FDA under an Emergency Use Authorization (EUA).  This EUA will remain in effect (meaning  this test can be used) for the duration of  the COVID-19 declaration under Section 564(b)(1) of the Act, 21 U.S.C. section 360-bbb-3(b)(1), unless the authorization is terminated or revoked sooner.  Performed at [facility], [place] [address], [place], [place] [address]   Culture, Urine     Status: Abnormal   Collection Time: 03/23/21 12:09 PM   Specimen: Urine, Clean Catch  Result Value Ref Range Status   Specimen Description URINE, CLEAN CATCH  Final   Special Requests   Final    NONE Performed at [facility], [place]  [address] [place], [place] [address]    Culture 40,000 COLONIES/mL PROTEUS MIRABILIS (A)  Final   Report Status 03/26/2021 FINAL  Final   Organism ID, Bacteria PROTEUS MIRABILIS (A)  Final      Susceptibility   Proteus mirabilis - MIC*    AMPICILLIN <=2 SENSITIVE Sensitive     CEFAZOLIN <=4 SENSITIVE Sensitive     CEFEPIME <=0.12 SENSITIVE Sensitive     CEFTRIAXONE <=0.25 SENSITIVE Sensitive     CIPROFLOXACIN <=0.25 SENSITIVE Sensitive     GENTAMICIN <=1 SENSITIVE Sensitive     IMIPENEM 2 SENSITIVE Sensitive      NITROFURANTOIN 128 RESISTANT Resistant     TRIMETH/SULFA <=20 SENSITIVE Sensitive     AMPICILLIN/SULBACTAM <=2 SENSITIVE Sensitive     PIP/TAZO <=4 SENSITIVE Sensitive     * 40,000 COLONIES/mL PROTEUS MIRABILIS    Radiology Reports CT HEAD WO CONTRAST  Result Date: 03/06/2021 CLINICAL DATA:  Fall EXAM: CT HEAD WITHOUT CONTRAST TECHNIQUE: Contiguous axial images were obtained from the base of the skull through the vertex without intravenous contrast. COMPARISON:  03/04/2021 FINDINGS: Brain: Unchanged appearance of right basal ganglia intraparenchymal hematoma with 7 mm of leftward midline shift. No new site of hemorrhage. Unchanged size and configuration of the ventricles. Vascular: No hyperdense vessel or unexpected calcification. Skull: Normal. Negative for fracture or focal lesion. Sinuses/Orbits: No acute finding. Other: None. IMPRESSION: Unchanged appearance of right basal ganglia intraparenchymal hematoma with 7 mm of leftward midline shift. Electronically Signed   By: Ulyses Jarred M.D.   On: 03/06/2021 03:35   CT HEAD WO CONTRAST  Result Date: 03/04/2021 CLINICAL DATA:  Follow-up intraparenchymal hemorrhage EXAM: CT HEAD WITHOUT CONTRAST TECHNIQUE: Contiguous axial images were obtained from the base of the skull through the vertex without intravenous contrast. COMPARISON:  03/03/2021 and multiple previous FINDINGS: Brain: Intraparenchymal hematoma with its epicenter in the right basal ganglia measures 5.9 x 3.8 x 4.4 cm, unchanged when measured using the same technique. Surrounding edema appears the same. Mass-effect with right-to-left midline shift of 8 mm appears the same. Compression of the right lateral ventricle. No evidence of frank ventricular trapping on the left. Elsewhere, chronic small-vessel ischemic changes are seen affecting the cerebral hemispheric white matter Vascular: There is atherosclerotic calcification of the major vessels at the base of the brain. Skull: Negative  Sinuses/Orbits: Mucosal thickening and retention cysts in the maxillary sinuses. Orbits negative. Other: None IMPRESSION: No change. Intraparenchymal hematoma with its epicenter in the right basal ganglia measures 5.9 x 3.8 x 4.4 cm (volume = 52 cm^3), unchanged when measured using the same technique. Surrounding edema appears the same. Mass-effect with right-to-left midline shift of 8 mm appears the same. No evidence of frank ventricular trapping on the left. Electronically Signed   By: Nelson Chimes M.D.   On: 03/04/2021 15:26   CT HEAD WO CONTRAST  Result Date: 03/03/2021 CLINICAL DATA:  Intracranial hemorrhage follow up EXAM: CT HEAD WITHOUT CONTRAST TECHNIQUE: Contiguous axial images were obtained from the base of the skull through the vertex without intravenous contrast. COMPARISON:  Head CT 02/22/2021 FINDINGS: Brain: Intraparenchymal hematoma has expanded to 5.8 x 3.6 cm, previously 5.5 x 2.8 cm. Leftward midline shift has also worsened and now measures 8 mm, previously 1 mm. The right lateral ventricle is effaced. Vascular: No hyperdense vessel or unexpected calcification. Skull: Normal. Negative for fracture or focal lesion. Sinuses/Orbits: Bilateral maxillary sinus mucosal thickening. Normal orbits. Other: None IMPRESSION: 1. Increased size of 5.8 x 3.6 cm right intraparenchymal  hematoma with worsening leftward midline shift, now measuring 8 mm. 2. Effaced right lateral ventricle, which may become entrapped and predisposes to hydrocephalus. These results will be called to the ordering clinician or representative by the Radiologist Assistant, and communication documented in the PACS or [employer]. Electronically Signed   By: Ulyses Jarred M.D.   On: 03/03/2021 22:46   US Abdomen Complete  Result Date: 03/03/2021 CLINICAL DATA:  Abdominal pain. EXAM: ABDOMEN ULTRASOUND COMPLETE COMPARISON:  CT 09/30/2020. FINDINGS: Gallbladder: No gallstones or wall thickening visualized. No sonographic Murphy  sign noted by sonographer. Common bile duct: Diameter: 2.5 mm Liver: No focal lesion identified. Within normal limits in parenchymal echogenicity. Portal vein is patent on color Doppler imaging with normal direction of blood flow towards the liver. IVC: No abnormality visualized. Pancreas: Visualized portion unremarkable. Spleen: Patient refused to complete exam spleen not imaged. Right Kidney: Length: 10.4 cm. Echogenicity within normal limits. No mass or hydronephrosis visualized. Left Kidney: Length: Patient refused to complete exam, left kidney not imaged. Abdominal aorta: No aneurysm visualized. Other findings: None. IMPRESSION: 1. Patient refused to complete exam. Spleen and left kidney not imaged. 2. No acute abnormality identified. No gallstones or biliary distention. Electronically Signed   By: Marcello Moores  Register   On: 03/03/2021 11:35   DG Chest Port 1 View  Result Date: 03/23/2021 CLINICAL DATA:  Upper respiratory infection with cough and congestion EXAM: PORTABLE CHEST 1 VIEW COMPARISON:  None. FINDINGS: Patient is rotated. The heart size and mediastinal contours are within normal limits for technique. Both lungs are clear. No pleural effusion or pneumothorax. The visualized skeletal structures are unremarkable. Surgical clips overlie the left axilla. IMPRESSION: No acute process in the chest. Electronically Signed   By: Macy Mis M.D.   On: 03/23/2021 16:04   DG Abd 2 Views  Result Date: 03/02/2021 CLINICAL DATA:  Abdominal pain EXAM: ABDOMEN - 2 VIEW COMPARISON:  CT abdomen and pelvis September 30, 2020 FINDINGS: Supine and upright images were obtained. There is contrast throughout the colon. There is no bowel dilatation or air-fluid level to suggest bowel obstruction. No free air. No abnormal calcifications evident. Note that contrast in the colon could mask smaller calcifications. Lung bases clear. IMPRESSION: Contrast throughout colon. No bowel obstruction or free air. No abnormal  calcifications; note that contrast in the colon could mask smaller calcifications. Lung bases clear. Electronically Signed   By: Lowella Grip III M.D.   On: 03/02/2021 13:58

## 2021-03-28 DIAGNOSIS — U071 COVID-19: Secondary | ICD-10-CM

## 2021-03-28 LAB — GLUCOSE, CAPILLARY
Glucose-Capillary: 102 mg/dL — ABNORMAL HIGH (ref 70–99)
Glucose-Capillary: 154 mg/dL — ABNORMAL HIGH (ref 70–99)
Glucose-Capillary: 85 mg/dL (ref 70–99)
Glucose-Capillary: 127 mg/dL — ABNORMAL HIGH (ref 70–99)

## 2021-03-28 NOTE — TOC Progression Note (Addendum)
Transition of Care [facility]) - Progression Note    Patient Details  Name: [name] MRN: [id number] Date of Birth: December 06, 1960  Transition of Care [facility]) CM/SW [place], RN Phone Number: 03/28/2021, 8:49 AM  Clinical Narrative:    Case management spoke with the patient's sister, [name], on the phone and she is aware and agreeable to SNF placement at [facility] once the patient is out of Day isolation that will be complete on Sunday 04/03/2021.    CM and MSW will continue to follow the patient for SNF placement at [facility].  03/28/2021 1234 - CM left a message with Tressa Busman, CM at [facility] to see if the patient can transfer out to the facility on Sunday, 04/03/2021.   Expected Discharge Plan: [place] Barriers to Discharge: Continued Medical Work up,Inadequate or no insurance,Active Substance Use - Placement  Expected Discharge Plan and Services Expected Discharge Plan: [place] In-house Referral: Clinical Social Work,Chaplain Discharge Planning Services: CM Consult Post Acute Care Choice: [place] Living arrangements for the past 2 months: [place] Determinants of Health (SDOH) Interventions    Readmission Risk Interventions No flowsheet data found.

## 2021-03-28 NOTE — Progress Notes (Addendum)
Occupational Therapy Treatment Patient Details Name: Chad Coleman MRN: [id number] DOB: November 20, 1961 Today's Date: 03/28/2021    History of present illness 60 yo male presents to [facility] on 3/28 with acute onset of L weakness, numbness, and L facial droop. CTH R BG ICH, suspect hypertensive.  Fall on 02/28/21. imaging showing increased in R intraparenchymal hematoma with worsening leftward midline shift on 4/7. Tested postive for COVID on 4/27. PMH includes hypertension, tobacco use, noncompliance with antihypertensive medications, and cocaine abuse.   OT comments  Patient supine in bed and agreeable to OT/PT session.  Patient requires up to mod assist for sitting balance at EOB during ADL tasks (applying lotion to L UE and B LEs) with cueing for midline posture and able to self correct with multimodal cueing given increased time. Patient reports need to use restroom, pivot to/from [facility] with max assist +2 demonstrating poor initiation, sequencing during task. Total assist +2 for toileting.  L UE supported throughout session and repositioned on pillow, pt educated on positioning, due to 1.5 finger width subluxation noted and may benefit from taping.  Goals updated today, met 1/6. Will follow acutely.    Follow Up Recommendations  SNF    Equipment Recommendations  None recommended by OT    Recommendations for Other Services      Precautions / Restrictions Precautions Precautions: Fall Precaution Comments: Pt with 2 in hospital falls. Pusher, mild L inattention, impulsive Restrictions Weight Bearing Restrictions: No       Mobility Bed Mobility Overal bed mobility: Needs Assistance Bed Mobility: Supine to Sit;Sit to Supine     Supine to sit: HOB elevated;Mod assist Sit to supine: Max assist   General bed mobility comments: patient require cueing for sequencing, max assist for L LE mgmt and trunk support to ascend; returned to supine with support for L side and trunk.  Able to pull self up  to [facility] in [place] using R UE/LE with supervision    Transfers Overall transfer level: Needs assistance Equipment used: 2 person hand held assist Transfers: Sit to/from [employer] Transfers Sit to Stand: Max assist;+2 physical assistance;+2 safety/equipment   Squat pivot transfers: Max assist;+2 safety/equipment;+2 physical assistance     General transfer comment: max assist +2 to power up and pivot to/from BSC, fully blocked L knee and patient requires max cueing for technique.  Squat pivot to/from commode and pt did not fully stand for transfer or hygiene. PHysical assist to pivot hips to/from bsc    Balance Overall balance assessment: Needs assistance Sitting-balance support: Feet supported;No upper extremity supported Sitting balance-Leahy Scale: Poor Sitting balance - Comments: min guard to mod assist at EOB with L lateral lean Postural control: Left lateral lean Standing balance support: During functional activity;Single extremity supported Standing balance-Leahy Scale: Poor Standing balance comment: max-total assist +2, pt with limited engagement in standing tasks today                           ADL either performed or assessed with clinical judgement   ADL Overall ADL's : Needs assistance/impaired     Grooming: Wash/dry face;Set up;Bed level   Upper Body Bathing: Moderate assistance;Sitting Upper Body Bathing Details (indicate cue type and reason): applying lotion to L UE with mod assist to maintain balance using R UE Lower Body Bathing: Maximal assistance;Sit to/from stand Lower Body Bathing Details (indicate cue type and reason): pt able to spread lotion to B LEs using R UE but requires mod-  max assist to maintain sitting balance during task; max assist for sit to stand         Toilet Transfer: Maximal assistance;+2 for physical assistance;+2 for safety/equipment;Stand-pivot;BSC Toilet Transfer Details (indicate cue type and reason): for pivot  to [facility], requires L knee to be totally blocked Toileting- Clothing Manipulation and Hygiene: Total assistance;+2 for safety/equipment;Sit to/from stand Toileting - Clothing Manipulation Details (indicate cue type and reason): for hygiene after + BM     Functional mobility during ADLs: Maximal assistance;+2 for physical assistance;+2 for safety/equipment General ADL Comments: pt limited by L sided weakness, impaired balance and cognition     Vision   Additional Comments: rt gaze preference   Perception     Praxis      Cognition Arousal/Alertness: Awake/alert Behavior During Therapy: Flat affect Overall Cognitive Status: Impaired/Different from baseline Area of Impairment: Safety/judgement;Attention;Awareness;Problem solving;Following commands                   Current Attention Level: Sustained Memory: Decreased short-term memory Following Commands: Follows one step commands with increased time;Follows one step commands inconsistently Safety/Judgement: Decreased awareness of safety;Decreased awareness of deficits Awareness: Emergent Problem Solving: Slow processing;Decreased initiation;Difficulty sequencing;Requires verbal cues;Requires tactile cues General Comments: mulitmodal cueing for ADL tasks, requires cueing to attend to tasks and complete tasks; decreased awareness of safety        Exercises     Shoulder Instructions       General Comments educated on L UE positoining supported on pillow, 1.5 finger width subluxation noted    Pertinent Vitals/ Pain       Pain Assessment: No/denies pain  Home Living                                          Prior Functioning/Environment              Frequency  Min 2X/week        Progress Toward Goals  OT Goals(current goals can now be found in the care plan section)  Progress towards OT goals: Progressing toward goals  Acute Rehab OT Goals Patient Stated Goal: to go home OT Goal  Formulation: With patient ADL Goals Pt Will Perform Grooming: with min assist;sitting Pt Will Perform Upper Body Bathing: with min assist;sitting Pt Will Transfer to Toilet: with min assist;with +2 assist;bedside commode;stand pivot transfer Additional ADL Goal #1: Pt will perform bed mobility with min assist and maintain static sitting balance with min guard as precursor to ADLs. Additional ADL Goal #2: Pt will maintain dynamic sitting balance within BOS during ADLs with no more than min assist. Additional ADL Goal #3: Pt will utilize L UE as a stabilizer for mod assist during ADL tasks.  Plan Discharge plan remains appropriate;Frequency remains appropriate    Co-evaluation    PT/OT/SLP Co-Evaluation/Treatment: Yes Reason for Co-Treatment: Necessary to address cognition/behavior during functional activity;For patient/therapist safety;To address functional/ADL transfers   OT goals addressed during session: ADL's and self-care      AM-PAC OT "6 Clicks" Daily Activity     Outcome Measure   Help from another person eating meals?: A Little Help from another person taking care of personal grooming?: A Little Help from another person toileting, which includes using toliet, bedpan, or urinal?: A Lot Help from another person bathing (including washing, rinsing, drying)?: A Lot Help from another person to put on and taking off  regular upper body clothing?: A Lot Help from another person to put on and taking off regular lower body clothing?: Total 6 Click Score: 13    End of Session    OT Visit Diagnosis: Unsteadiness on feet (R26.81);Other abnormalities of gait and mobility (R26.89);Muscle weakness (generalized) (M62.81);Hemiplegia and hemiparesis;Pain Hemiplegia - Right/Left: Left Hemiplegia - dominant/non-dominant: Dominant Hemiplegia - caused by: Cerebral infarction Pain - Right/Left: Left Pain - part of body: Shoulder   Activity Tolerance Patient tolerated treatment well    Patient Left in bed;with call bell/phone within reach;with bed alarm set   Nurse Communication Mobility status        Time: 1428-1500 OT Time Calculation (min): 32 min  Charges: OT General Charges $OT Visit: 1 Visit OT Treatments $Self Care/Home Management : 8-22 mins  Jolaine Artist, OT Acute Rehabilitation Services Pager [phone] Office [phone]    Delight Stare 03/28/2021, 4:03 PM

## 2021-03-28 NOTE — Plan of Care (Signed)
  Problem: Coping: Goal: Will verbalize positive feelings about self Outcome: Progressing Goal: Will identify appropriate support needs Outcome: Progressing   Problem: Health Behavior/Discharge Planning: Goal: Ability to manage health-related needs will improve Outcome: Progressing   Problem: Self-Care: Goal: Ability to participate in self-care as condition permits will improve Outcome: Progressing Goal: Verbalization of feelings and concerns over difficulty with self-care will improve Outcome: Progressing Goal: Ability to communicate needs accurately will improve Outcome: Progressing   Problem: Nutrition: Goal: Risk of aspiration will decrease Outcome: Progressing Goal: Dietary intake will improve Outcome: Progressing   Problem: Intracerebral Hemorrhage Tissue Perfusion: Goal: Complications of Intracerebral Hemorrhage will be minimized Outcome: Progressing   Problem: Safety: Goal: Non-violent Restraint(s) Outcome: Progressing   Problem: Education: Goal: Knowledge of General Education information will improve Description: Including pain rating scale, medication(s)/side effects and non-pharmacologic comfort measures Outcome: Progressing   Problem: Health Behavior/Discharge Planning: Goal: Ability to manage health-related needs will improve Outcome: Progressing   Problem: Clinical Measurements: Goal: Ability to maintain clinical measurements within normal limits will improve Outcome: Progressing Goal: Will remain free from infection Outcome: Progressing Goal: Diagnostic test results will improve Outcome: Progressing Goal: Respiratory complications will improve Outcome: Progressing Goal: Cardiovascular complication will be avoided Outcome: Progressing   Problem: Activity: Goal: Risk for activity intolerance will decrease Outcome: Progressing   Problem: Nutrition: Goal: Adequate nutrition will be maintained Outcome: Progressing   Problem: Coping: Goal: Level  of anxiety will decrease Outcome: Progressing   Problem: Elimination: Goal: Will not experience complications related to bowel motility Outcome: Progressing Goal: Will not experience complications related to urinary retention Outcome: Progressing   Problem: Pain Managment: Goal: General experience of comfort will improve Outcome: Progressing   Problem: Safety: Goal: Ability to remain free from injury will improve Outcome: Progressing   Problem: Skin Integrity: Goal: Risk for impaired skin integrity will decrease Outcome: Progressing   Problem: Education: Goal: Knowledge of disease or condition will improve Outcome: Progressing Goal: Knowledge of secondary prevention will improve Outcome: Progressing Goal: Knowledge of patient specific risk factors addressed and post discharge goals established will improve Outcome: Progressing Goal: Individualized Educational Video(s) Outcome: Progressing   Problem: Safety: Goal: Non-violent Restraint(s) Outcome: Progressing   

## 2021-03-28 NOTE — Progress Notes (Signed)
Physical Therapy Treatment Patient Details Name: Chad Coleman MRN: [id number] DOB: 1961/11/22 Today's Date: 03/28/2021    History of Present Illness 60 yo male presents to [facility] on 3/28 with acute onset of L weakness, numbness, and L facial droop. CTH R BG ICH, suspect hypertensive.  Fall on 02/28/21. imaging showing increased in R intraparenchymal hematoma with worsening leftward midline shift on 4/7. Tested postive for COVID on 4/27. PMH includes hypertension, tobacco use, noncompliance with antihypertensive medications, and cocaine abuse.    PT Comments    Pt agreeable to OOB mobility, but reporting fatigue today. Pt requiring max +2 assist for bed mobility and transfer to/from [facility], pt with difficulty with command following during standing tasks requiring max cuing. Pt with improving ability to make postural adjustments in sitting tasks. PT continuing to recommend SNF.    Follow Up Recommendations  Supervision/Assistance - 24 hour;SNF     Equipment Recommendations  Wheelchair (measurements PT);Wheelchair cushion (measurements PT)    Recommendations for Other Services       Precautions / Restrictions Precautions Precautions: Fall Precaution Comments: Pt with 2 in hospital falls. Pusher, mild L inattention, impulsive Restrictions Weight Bearing Restrictions: No    Mobility  Bed Mobility Overal bed mobility: Needs Assistance Bed Mobility: Supine to Sit;Sit to Supine     Supine to sit: HOB elevated;Mod assist Sit to supine: Max assist   General bed mobility comments: patient require cueing for sequencing, max assist for L LE mgmt and trunk support to ascend; returned to supine with support for L side and trunk.  Able to pull self up to [facility] in [place] using R UE/LE with supervision    Transfers Overall transfer level: Needs assistance Equipment used: 2 person hand held assist Transfers: Sit to/from [employer] Transfers Sit to Stand: Max assist;+2 physical  assistance;+2 safety/equipment   Squat pivot transfers: Max assist;+2 safety/equipment;+2 physical assistance     General transfer comment: max assist +2 to power up and pivot to/from BSC, fully blocked L knee and patient requires max cueing for technique.  Squat pivot to/from commode and pt did not fully stand for transfer or hygiene. PHysical assist to pivot hips to/from bsc. LUE held in shoulder-approximated position given pt subluxation  Ambulation/Gait                 Stairs             Wheelchair Mobility    Modified Rankin (Stroke Patients Only) Modified Rankin (Stroke Patients Only) Pre-Morbid Rankin Score: No symptoms Modified Rankin: Severe disability     Balance Overall balance assessment: Needs assistance Sitting-balance support: Feet supported;No upper extremity supported Sitting balance-Leahy Scale: Poor Sitting balance - Comments: min guard to mod assist at EOB with L lateral lean, pt able to self-correct posture after repeated PT cuing Postural control: Left lateral lean Standing balance support: During functional activity;Single extremity supported Standing balance-Leahy Scale: Poor Standing balance comment: max-total assist +2, pt with limited engagement in standing tasks today                            Cognition Arousal/Alertness: Awake/alert Behavior During Therapy: Flat affect Overall Cognitive Status: Impaired/Different from baseline Area of Impairment: Safety/judgement;Attention;Awareness;Problem solving;Following commands                   Current Attention Level: Sustained Memory: Decreased short-term memory Following Commands: Follows one step commands with increased time;Follows one step commands inconsistently Safety/Judgement: Decreased  awareness of safety;Decreased awareness of deficits Awareness: Emergent Problem Solving: Slow processing;Decreased initiation;Difficulty sequencing;Requires verbal cues;Requires  tactile cues General Comments: mulitmodal cueing for ADL tasks, requires cueing to attend to tasks and complete tasks; decreased awareness of safety      Exercises      General Comments General comments (skin integrity, edema, etc.): educated on L UE positoining supported on pillow, 1.5 finger width subluxation noted      Pertinent Vitals/Pain Pain Assessment: No/denies pain    Home Living                      Prior Function            PT Goals (current goals can now be found in the care plan section) Acute Rehab PT Goals Patient Stated Goal: to go home PT Goal Formulation: With patient Time For Goal Achievement: 04/06/21 Potential to Achieve Goals: Good Progress towards PT goals: Progressing toward goals    Frequency    Min 3X/week      PT Plan Current plan remains appropriate    Co-evaluation PT/OT/SLP Co-Evaluation/Treatment: Yes Reason for Co-Treatment: For patient/therapist safety;To address functional/ADL transfers;Necessary to address cognition/behavior during functional activity PT goals addressed during session: Mobility/safety with mobility;Strengthening/ROM;Balance OT goals addressed during session: ADL's and self-care      AM-PAC PT "6 Clicks" Mobility   Outcome Measure  Help needed turning from your back to your side while in a flat bed without using bedrails?: A Lot Help needed moving from lying on your back to sitting on the side of a flat bed without using bedrails?: A Lot Help needed moving to and from a bed to a chair (including a wheelchair)?: Total Help needed standing up from a chair using your arms (e.g., wheelchair or bedside chair)?: Total Help needed to walk in hospital room?: Total Help needed climbing 3-5 steps with a railing? : Total 6 Click Score: 8    End of Session   Activity Tolerance: Patient tolerated treatment well Patient left: in bed;with call bell/phone within reach;with bed alarm set;with family/visitor  present Nurse Communication: Mobility status PT Visit Diagnosis: Hemiplegia and hemiparesis;Unsteadiness on feet (R26.81);Muscle weakness (generalized) (M62.81);Difficulty in walking, not elsewhere classified (R26.2);Other symptoms and signs involving the nervous system (R29.898) Hemiplegia - Right/Left: Left Hemiplegia - dominant/non-dominant: Dominant Hemiplegia - caused by: Nontraumatic intracerebral hemorrhage     Time: 1428-1500 PT Time Calculation (min) (ACUTE ONLY): 32 min  Charges:  $Therapeutic Activity: 8-22 mins                     Stacie Glaze, PT DPT Acute Rehabilitation Services Pager [phone]  Office [phone]    Roxine Caddy E [name] 03/28/2021, 5:07 PM

## 2021-03-28 NOTE — Progress Notes (Signed)
[facility] PROGRESS NOTE  Chad Coleman MBW:[id number] DOB: [birth year] DOA: 02/21/2021 PCP: Patient, No Pcp Per (Inactive)  Status: Remains inpatient appropriate because:Altered mental status, Unsafe d/c plan and Inpatient level of care appropriate due to severity of illness   Dispo: The patient is from: Home              Anticipated d/c is to: SNF-[facility] likely Monday 5/9              Patient currently is medically stable to d/c.    Difficult to place patient Yes-Medicaid pending   Level of care: Med-Surg  Code Status: Full Family Communication: 5/2 [name] [phone]. DVT prophylaxis: SCDs Vaccination status: Fully vaccinated with Moderna vaccine with second shot in August 2021-has not received a booster shot   HPI: 60 year old gentleman prior history of hypertension, cocaine use, tobacco abuse admitted on 02/21/2021 with a code stroke was found to have intracranial hemorrhage in the right basal ganglia secondary to hypertension and cocaine use.  Patient was transferred from neurology service to hospitalist service on 02/26/2021. Therapy evaluations recommending SNF.  pt had a fall on 03/03/21, he underwent CT head showing increased size of the intraparenchymal hematoma with worsening leftward midline shift, now measuring 8 mm.Effaced right lateral ventricle, which may become entrapped and predisposes to hydrocephalus. He was transferred to ICU overnight, neuro surgery consulted and a repeat CT head ordered which was essentially stable .  Pt had another fall in the room on 03/05/21, repeat CT head without contrast showed Unchanged appearance of right basal ganglia intraparenchymal hematoma with 7 mm of leftward midline shift.  Ends admission patient has done well although recommendation from PT and OT is for SNF for further treatments.  Patient stable to discharge but was found to be COVID-positive as of 4/27.  He was mildly symptomatic with fever and cough.  He has  completed 3 days of remdesivir and will be eligible to come off of quarantine as of 5/7.  Please refer to note dictated on 4/27 for expanded details regarding hospitalization.  Subjective: Awake and alert without any complaints.  No cough or shortness of breath.  Requesting that I call his sister to update her on current status and discharge plan.  Objective: Vitals:   03/28/21 0711 03/28/21 1230  BP: 114/79 111/75  Pulse: 67 74  Resp: 18 16  Temp: 98.2 F (36.8 C) 98.5 F (36.9 C)  SpO2: 98% 100%   No intake or output data in the 24 hours ending 03/28/21 1526 Filed Weights   02/25/21 1357  Weight: 73.2 kg    Exam:  Constitutional: Awake, no acute distress, calm Cardiovascular: Normal heart sounds, regular pulse, no tachycardia. Abdomen:   LBM 5/2, soft nontender nondistended.  Normoactive bowel sounds.  Appetite not as brisk as prior to development of COVID. Neurologic: CN 2-12 grossly intact.  Right side sensation intact.. Strength 5/5 on the right with a dense, insensate left hemiplegia involving both the upper and lower extremity Psychiatric alert and oriented x3.  Affect   Assessment/Plan: Acute problems: Fever 2/2 symptomatic COVID Now afebrile-no hypoxia-completely asymptomatic at this point-has completed 3 days of Remdesivir.  Apart from observation-no further therapy required.  We will plan on 10 days of isolation from the day of diagnosis.    Intracranial hemorrhage with associated brain injury Continue dysphagia diet as documented below Continue Zoloft 50 mg daily for brain injury related impulsivity Cognitive eval per OT/SLP has demonstrated deficits  Dysphagia D3 diet as  recommended by SLP   Physical Deconditioning -Secondary to recent stroke -PT/OT recommend SNF for rehabilitative therapy -Continue PRAFO shoe   Hypertension Continue hydralazine, Norvasc, clonidine, isosorbide and lisinopril  Hyperlipidemia Continue statin.  Type 2 diabetes  mellitus uncontrolled with hyperglycemia Hemoglobin A1 c is 6.4. not on insuliin at home.  Continue Metformin and SSI     Other problems: Hypokalemia and hyponatremia Resolved  Mild elevation of liver enzymes.  Resolved Unremarkable abdominal ultrasound   Data Reviewed: Basic Metabolic Panel: Recent Labs  Lab 03/24/21 0407 03/25/21 0128  NA 131* 132*  K 4.0 3.8  CL 95* 96*  CO2 27 27  GLUCOSE 94 91  BUN 15 15  CREATININE 0.95 1.09  CALCIUM 9.0 8.9   Liver Function Tests: Recent Labs  Lab 03/24/21 0407 03/25/21 0128  AST 49* 44*  ALT 50* 43  ALKPHOS 43 40  BILITOT 0.6 0.6  PROT 6.2* 6.1*  ALBUMIN 2.9* 2.8*   CBC: Recent Labs  Lab 03/24/21 0407  WBC 5.1  HGB 13.1  HCT 39.9  MCV 87.5  PLT 291   Cardiac Enzymes: Recent Labs  Lab 03/23/21 1244 03/24/21 0407  CKTOTAL 71 65     CBG: Recent Labs  Lab 03/27/21 1244 03/27/21 1721 03/27/21 2103 03/28/21 0627 03/28/21 1226  GLUCAP 129* 87 110* 102* 154*       Studies: No results found.  Scheduled Meds: . amLODipine  10 mg Oral Daily  . atorvastatin  20 mg Oral Daily  . carvedilol  6.25 mg Oral BID WC  . diclofenac Sodium  4 g Topical QID  . feeding supplement  237 mL Oral TID BM  . hydrALAZINE  100 mg Oral Q8H  . insulin aspart  0-5 Units Subcutaneous QHS  . insulin aspart  0-9 Units Subcutaneous TID WC  . lisinopril  40 mg Oral Daily  . metFORMIN  500 mg Oral Q breakfast  . multivitamin with minerals  1 tablet Oral Daily  . pantoprazole  40 mg Oral BID  . polyethylene glycol  17 g Oral Daily  . senna-docusate  1 tablet Oral BID  . sertraline  50 mg Oral Daily  . tiZANidine  2 mg Oral QHS  . valproic acid  250 mg Oral BID   Continuous Infusions:  Active Problems:   Intraparenchymal hemorrhage of brain (HCC)   Hypertension   Tobacco abuse   Hyponatremia   Leukocytosis   Controlled type 2 diabetes mellitus with hyperglycemia, without long-term current use of insulin ([place])    Brain injury with brief loss of consciousness ([place])   Acute pain of right shoulder   URI with cough and congestion   Fever, unspecified   Consultants:  Neurology  Neurosurgery  Procedures:  Echocardiogram  Antibiotics: Remdesivir 4/27-4/29  Time spent: 20 minutes    Erin Hearing ANP  [facility] 7 am - 330 pm/M-F for direct patient care and secure chat Please refer to Amion for contact info 35  days

## 2021-03-29 LAB — GLUCOSE, CAPILLARY
Glucose-Capillary: 101 mg/dL — ABNORMAL HIGH (ref 70–99)
Glucose-Capillary: 108 mg/dL — ABNORMAL HIGH (ref 70–99)
Glucose-Capillary: 104 mg/dL — ABNORMAL HIGH (ref 70–99)
Glucose-Capillary: 141 mg/dL — ABNORMAL HIGH (ref 70–99)

## 2021-03-29 NOTE — TOC Progression Note (Signed)
Transition of Care [facility]) - Progression Note    Patient Details  Name: Chad Coleman MRN: [id number] Date of Birth: February 11, 1961  Transition of Care [facility]) CM/SW [place], RN Phone Number: 03/29/2021, 8:51 AM  Clinical Narrative:    CM spoke with Tressa Busman, [place] at [place], to discuss discharge date to the facility once the patient has been cleared from respiratory isolation.  [facility] to check with leadership at the facility to determine transfer date.  CM and MSW to continue to follow the patient for SNF admission.   Expected Discharge Plan: [place] Barriers to Discharge: Continued Medical Work up,Inadequate or no insurance,Active Substance Use - Placement  Expected Discharge Plan and Services Expected Discharge Plan: [place] In-house Referral: Clinical Social Work,Chaplain Discharge Planning Services: CM Consult Post Acute Care Choice: [place] Living arrangements for the past 2 months: [place] Determinants of Health (SDOH) Interventions    Readmission Risk Interventions No flowsheet data found.

## 2021-03-29 NOTE — Plan of Care (Signed)
  Problem: Coping: Goal: Will verbalize positive feelings about self Outcome: Progressing Goal: Will identify appropriate support needs Outcome: Progressing   Problem: Health Behavior/Discharge Planning: Goal: Ability to manage health-related needs will improve Outcome: Progressing   Problem: Self-Care: Goal: Ability to participate in self-care as condition permits will improve Outcome: Progressing Goal: Verbalization of feelings and concerns over difficulty with self-care will improve Outcome: Progressing Goal: Ability to communicate needs accurately will improve Outcome: Progressing   Problem: Nutrition: Goal: Risk of aspiration will decrease Outcome: Progressing Goal: Dietary intake will improve Outcome: Progressing   Problem: Intracerebral Hemorrhage Tissue Perfusion: Goal: Complications of Intracerebral Hemorrhage will be minimized Outcome: Progressing   Problem: Safety: Goal: Non-violent Restraint(s) Outcome: Progressing   Problem: Education: Goal: Knowledge of General Education information will improve Description: Including pain rating scale, medication(s)/side effects and non-pharmacologic comfort measures Outcome: Progressing   Problem: Health Behavior/Discharge Planning: Goal: Ability to manage health-related needs will improve Outcome: Progressing   Problem: Clinical Measurements: Goal: Ability to maintain clinical measurements within normal limits will improve Outcome: Progressing Goal: Will remain free from infection Outcome: Progressing Goal: Diagnostic test results will improve Outcome: Progressing Goal: Respiratory complications will improve Outcome: Progressing Goal: Cardiovascular complication will be avoided Outcome: Progressing   Problem: Activity: Goal: Risk for activity intolerance will decrease Outcome: Progressing   Problem: Nutrition: Goal: Adequate nutrition will be maintained Outcome: Progressing   Problem: Coping: Goal: Level  of anxiety will decrease Outcome: Progressing   Problem: Elimination: Goal: Will not experience complications related to bowel motility Outcome: Progressing Goal: Will not experience complications related to urinary retention Outcome: Progressing   Problem: Pain Managment: Goal: General experience of comfort will improve Outcome: Progressing   Problem: Safety: Goal: Ability to remain free from injury will improve Outcome: Progressing   Problem: Skin Integrity: Goal: Risk for impaired skin integrity will decrease Outcome: Progressing   Problem: Education: Goal: Knowledge of disease or condition will improve Outcome: Progressing Goal: Knowledge of secondary prevention will improve Outcome: Progressing Goal: Knowledge of patient specific risk factors addressed and post discharge goals established will improve Outcome: Progressing Goal: Individualized Educational Video(s) Outcome: Progressing   Problem: Safety: Goal: Non-violent Restraint(s) Outcome: Progressing   

## 2021-03-29 NOTE — Progress Notes (Signed)
[facility] PROGRESS NOTE  Chad Coleman TIW:[id number] DOB: 1961/09/22 DOA: 02/21/2021 PCP: Patient, No Pcp Per (Inactive)  Status: Remains inpatient appropriate because:Altered mental status, Unsafe d/c plan and Inpatient level of care appropriate due to severity of illness   Dispo: The patient is from: Home              Anticipated d/c is to: SNF-[facility] likely Monday 5/9              Patient currently is medically stable to d/c.    Difficult to place patient Yes-Medicaid pending   Level of care: Med-Surg  Code Status: Full Family Communication: 5/2 Chad Coleman [phone]. DVT prophylaxis: SCDs Vaccination status: Fully vaccinated with Moderna vaccine with second shot in August 2021-has not received a booster shot   HPI: 60 year old gentleman prior history of hypertension, cocaine use, tobacco abuse admitted on 02/21/2021 with a code stroke was found to have intracranial hemorrhage in the right basal ganglia secondary to hypertension and cocaine use.  Patient was transferred from neurology service to hospitalist service on 02/26/2021. Therapy evaluations recommending SNF.  pt had a fall on 03/03/21, he underwent CT head showing increased size of the intraparenchymal hematoma with worsening leftward midline shift, now measuring 8 mm.Effaced right lateral ventricle, which may become entrapped and predisposes to hydrocephalus. He was transferred to ICU overnight, neuro surgery consulted and a repeat CT head ordered which was essentially stable .  Pt had another fall in the room on 03/05/21, repeat CT head without contrast showed Unchanged appearance of right basal ganglia intraparenchymal hematoma with 7 mm of leftward midline shift.  Ends admission patient has done well although recommendation from PT and OT is for SNF for further treatments.  Patient stable to discharge but was found to be COVID-positive as of 4/27.  He was mildly symptomatic with fever and cough.  He has  completed 3 days of remdesivir and will be eligible to come off of quarantine as of 5/7.  Please refer to note dictated on 4/27 for expanded details regarding hospitalization.  Subjective: Awake and without any specific complaints other than the fact that he had accidentally urinated on himself and needed to be changed.  Updated that I had spoken to his Chad and she is aware of his status and is hopeful that she will be able to visit late Saturday afternoon after his quarantine is lifted  Objective: Vitals:   03/28/21 2200 03/29/21 0531  BP: 126/72 114/81  Pulse: 63 (!) 57  Resp: 16   Temp: 98.8 F (37.1 C) 98.7 F (37.1 C)  SpO2: 99% 99%    Intake/Output Summary (Last 24 hours) at 03/29/2021 4193 Last data filed at 03/28/2021 1200 Gross per 24 hour  Intake 236 ml  Output 350 ml  Net -114 ml   Filed Weights   02/25/21 1357  Weight: 73.2 kg    Exam:  Constitutional: Alert, calm, no acute distress Cardiovascular:, No tachycardia, no peripheral edema, normotensive Abdomen:   LBM 5/2, normoactive bowel sounds, tolerating diet, abdomen nontender upon exam Neurologic: Cranial nerves are intact without facial droop, dense left hemiplegia and unable to ambulate.  Sensation intact.  No changes on right side Psychiatric alert and oriented x3.  Pleasant affect   Assessment/Plan: Acute problems: COVID 19 infection Now afebrile-no hypoxia-completely asymptomatic at this point Has completed 3 days of Remdesivir.  Apart from observation-no further therapy required.   Will need to complete 10 days of isolation from the day of diagnosis.  This will be on Saturday, May 7 after 12:19 PM   Intracranial hemorrhage with associated brain injury Continue dysphagia diet as documented below Continue Zoloft 50 mg daily for brain injury related impulsivity Cognitive eval per OT/SLP has demonstrated deficits  Dysphagia D3 diet as recommended by SLP  Physical Deconditioning -Secondary to recent  stroke -PT/OT recommend SNF for rehabilitative therapy -Continue PRAFO shoe   Hypertension Continue hydralazine, Norvasc, clonidine, isosorbide and lisinopril  Hyperlipidemia Continue statin.  Type 2 diabetes mellitus uncontrolled with hyperglycemia Hemoglobin A1 c is 6.4. not on insuliin at home.  Continue Metformin and SSI     Other problems: Hypokalemia and hyponatremia Resolved  Mild elevation of liver enzymes.  Resolved Unremarkable abdominal ultrasound   Data Reviewed: Basic Metabolic Panel: Recent Labs  Lab 03/24/21 0407 03/25/21 0128  NA 131* 132*  K 4.0 3.8  CL 95* 96*  CO2 27 27  GLUCOSE 94 91  BUN 15 15  CREATININE 0.95 1.09  CALCIUM 9.0 8.9   Liver Function Tests: Recent Labs  Lab 03/24/21 0407 03/25/21 0128  AST 49* 44*  ALT 50* 43  ALKPHOS 43 40  BILITOT 0.6 0.6  PROT 6.2* 6.1*  ALBUMIN 2.9* 2.8*   CBC: Recent Labs  Lab 03/24/21 0407  WBC 5.1  HGB 13.1  HCT 39.9  MCV 87.5  PLT 291   Cardiac Enzymes: Recent Labs  Lab 03/23/21 1244 03/24/21 0407  CKTOTAL 71 65     CBG: Recent Labs  Lab 03/28/21 0627 03/28/21 1226 03/28/21 1721 03/28/21 2159 03/29/21 0651  GLUCAP 102* 154* 85 127* 101*       Studies: No results found.  Scheduled Meds: . amLODipine  10 mg Oral Daily  . atorvastatin  20 mg Oral Daily  . carvedilol  6.25 mg Oral BID WC  . diclofenac Sodium  4 g Topical QID  . feeding supplement  237 mL Oral TID BM  . hydrALAZINE  100 mg Oral Q8H  . insulin aspart  0-5 Units Subcutaneous QHS  . insulin aspart  0-9 Units Subcutaneous TID WC  . lisinopril  40 mg Oral Daily  . metFORMIN  500 mg Oral Q breakfast  . multivitamin with minerals  1 tablet Oral Daily  . pantoprazole  40 mg Oral BID  . polyethylene glycol  17 g Oral Daily  . senna-docusate  1 tablet Oral BID  . sertraline  50 mg Oral Daily  . tiZANidine  2 mg Oral QHS  . valproic acid  250 mg Oral BID   Continuous Infusions:  Active  Problems:   Intraparenchymal hemorrhage of brain (HCC)   Hypertension   Tobacco abuse   Hyponatremia   Leukocytosis   Controlled type 2 diabetes mellitus with hyperglycemia, without long-term current use of insulin ([place])   Brain injury with brief loss of consciousness ([place])   Acute pain of right shoulder   URI with cough and congestion   Fever, unspecified   COVID-19 virus infection   Consultants:  Neurology  Neurosurgery  Procedures:  Echocardiogram  Antibiotics: Remdesivir 4/27-4/29  Time spent: 20 minutes    Erin Hearing ANP  [facility] 7 am - 330 pm/M-F for direct patient care and secure chat Please refer to Amion for contact info 36  days

## 2021-03-30 LAB — GLUCOSE, CAPILLARY
Glucose-Capillary: 110 mg/dL — ABNORMAL HIGH (ref 70–99)
Glucose-Capillary: 109 mg/dL — ABNORMAL HIGH (ref 70–99)
Glucose-Capillary: 102 mg/dL — ABNORMAL HIGH (ref 70–99)
Glucose-Capillary: 125 mg/dL — ABNORMAL HIGH (ref 70–99)
Glucose-Capillary: 92 mg/dL (ref 70–99)

## 2021-03-30 MED ORDER — SODIUM CHLORIDE 0.9 % IV SOLN
1.0000 g | INTRAVENOUS | Status: AC
  Administered 2021-03-30 – 2021-04-01 (×3): 1 g via INTRAVENOUS
  Filled 2021-03-30 (×3): qty 10

## 2021-03-30 NOTE — Progress Notes (Signed)
Occupational Therapy Treatment Patient Details Name: Chad Coleman MRN: [id number] DOB: Jun 15, 1961 Today's Date: 03/30/2021    History of present illness 60 yo male presents to [facility] on 3/28 with acute onset of L weakness, numbness, and L facial droop. CTH R BG ICH, suspect hypertensive.  Fall on 02/28/21. imaging showing increased in R intraparenchymal hematoma with worsening leftward midline shift on 4/7. Tested postive for COVID on 4/27. PMH includes hypertension, tobacco use, noncompliance with antihypertensive medications, and cocaine abuse.   OT comments  Pt seen in conjunction with PT.  Facilitation of improved trunk control, Lt UE function and functional transfers performed with focus on activation of lt trunk and UE as well as inhibition to decreased pushing of Rt UE and Rt LE. He was able to activate small excursions of shoulder flexion and extension, and was able to perform functional transfers with mod A +2.   Continue to recommend SNF.   Follow Up Recommendations  SNF    Equipment Recommendations  None recommended by OT    Recommendations for Other Services      Precautions / Restrictions Precautions Precautions: Fall Precaution Comments: Pt with 2 in hospital falls. Pushers, mild L inattention, impulsive Restrictions Weight Bearing Restrictions: No       Mobility Bed Mobility Overal bed mobility: Needs Assistance Bed Mobility: Supine to Sit;Sit to Supine     Supine to sit: +2 for safety/equipment;Mod assist Sit to supine: Mod assist;+2 for safety/equipment   General bed mobility comments: mod assist for trunk elevation/lowering with assist of R HHA, completing LLE lifting in/out of bed. Pt utilizing RLE to scoot LLE to EOB prior to initiating pull to sit.    Transfers Overall transfer level: Needs assistance Equipment used: 2 person hand held assist Transfers: Set designer Transfers;Sit to/from Stand Sit to Stand: Mod assist;+2 physical assistance   Squat  pivot transfers: Mod assist;+2 physical assistance     General transfer comment: mod +2 for power up, rise, steady, and cuing RLE and truncal extension to reach upright. STS x2, with squat pivot to/from [facility] towards R, assist for hip translation, LLE close guarding, and pivoting on RLE.    Balance Overall balance assessment: Needs assistance Sitting-balance support: Feet supported;No upper extremity supported Sitting balance-Leahy Scale: Poor Sitting balance - Comments: evolving level of assist (min guard to min/mod assist) to correct L lateral leaning, R pushing behaviors. Pt able to find upright sitting with verbal cuing, benefits from visual cues to upright and midline. EOB sitting x15 minutes with OT facilitating upright, PT assisting pt in finding midline and blocking RLE from pushing L Postural control: Left lateral lean Standing balance support: During functional activity;Single extremity supported Standing balance-Leahy Scale: Poor Standing balance comment: reliant on external assist                           ADL either performed or assessed with clinical judgement   ADL Overall ADL's : Needs assistance/impaired                     Lower Body Dressing: Maximal assistance;Sit to/from stand Lower Body Dressing Details (indicate cue type and reason): Pt was able to don socks in supine with supervision and increased time Toilet Transfer: Moderate assistance;+2 for physical assistance;+2 for safety/equipment;Squat-pivot;BSC   Toileting- Clothing Manipulation and Hygiene: Total assistance;Sit to/from stand       Functional mobility during ADLs: Moderate assistance;+2 for physical assistance;+2 for safety/equipment  Vision   Additional Comments: Lt inattention with Rt gaze preference   Perception     Praxis      Cognition Arousal/Alertness: Awake/alert Behavior During Therapy: WFL for tasks assessed/performed;Flat affect Overall Cognitive Status:  Impaired/Different from baseline Area of Impairment: Attention;Safety/judgement;Awareness;Problem solving                   Current Attention Level: Selective Memory: Decreased short-term memory Following Commands: Follows one step commands consistently;Follows multi-step commands consistently Safety/Judgement: Decreased awareness of deficits;Decreased awareness of safety Awareness: Emergent Problem Solving: Slow processing;Decreased initiation;Difficulty sequencing;Requires verbal cues;Requires tactile cues General Comments: Pt requires frequent multimodal cuing to avoid pushing behaviors, able to self-correct intermittently after several external correction cues. Pt with decreased postural awareness, benefits from external midline, vertical cues to correct.        Exercises Other Exercises Other Exercises: while seated EOB worked on facilitation of anterior/posterior pelvic tilts and rotation to improve balance and faciltiation of Lt UE   Shoulder Instructions       General Comments      Pertinent Vitals/ Pain       Pain Assessment: No/denies pain Pain Intervention(s): Monitored during session  Home Living                                          Prior Functioning/Environment              Frequency  Min 2X/week        Progress Toward Goals  OT Goals(current goals can now be found in the care plan section)  Progress towards OT goals: Progressing toward goals  Acute Rehab OT Goals Patient Stated Goal: to use Lt UE OT Goal Formulation: With patient Time For Goal Achievement: 04/13/21 Potential to Achieve Goals: Good  Plan Discharge plan remains appropriate    Co-evaluation    PT/OT/SLP Co-Evaluation/Treatment: Yes Reason for Co-Treatment: Necessary to address cognition/behavior during functional activity;For patient/therapist safety;To address functional/ADL transfers PT goals addressed during session: Mobility/safety with  mobility;Balance;Strengthening/ROM OT goals addressed during session: ADL's and self-care      AM-PAC OT "6 Clicks" Daily Activity     Outcome Measure   Help from another person eating meals?: A Little Help from another person taking care of personal grooming?: A Little Help from another person toileting, which includes using toliet, bedpan, or urinal?: A Lot Help from another person bathing (including washing, rinsing, drying)?: A Lot Help from another person to put on and taking off regular upper body clothing?: A Lot Help from another person to put on and taking off regular lower body clothing?: A Lot 6 Click Score: 14    End of Session    OT Visit Diagnosis: Unsteadiness on feet (R26.81);Other abnormalities of gait and mobility (R26.89);Muscle weakness (generalized) (M62.81);Hemiplegia and hemiparesis;Pain Hemiplegia - Right/Left: Left Hemiplegia - dominant/non-dominant: Dominant Hemiplegia - caused by: Cerebral infarction   Activity Tolerance Patient tolerated treatment well   Patient Left in bed;with call bell/phone within reach;with bed alarm set   Nurse Communication Mobility status        Time: 2130-8657 OT Time Calculation (min): 56 min  Charges: OT General Charges $OT Visit: 1 Visit OT Treatments $Neuromuscular Re-education: 23-37 mins  Nilsa Nutting OTR/L Acute Rehabilitation Services Pager [phone] Office [phone]    Lucille Passy M 03/30/2021, 5:05 PM

## 2021-03-30 NOTE — Progress Notes (Signed)
Physical Therapy Treatment Patient Details Name: Chad Coleman MRN: [id number] DOB: [birth year] Today's Date: 03/30/2021    History of Present Illness 60 yo male presents to [facility] on 3/28 with acute onset of L weakness, numbness, and L facial droop. CTH R BG ICH, suspect hypertensive.  Fall on 02/28/21. imaging showing increased in R intraparenchymal hematoma with worsening leftward midline shift on 4/7. Tested postive for COVID on 4/27. PMH includes hypertension, tobacco use, noncompliance with antihypertensive medications, and cocaine abuse.    PT Comments    Pt motivated to participate in PT/OT session. Pt continuing to demonstrate strong pushing behaviors, requires max multimodal cuing to correct push to L but after several cues pt intermittently correcting self. Pt with need to have BM after several minutes on seated balance intervention, requiring mod +2 assist for pivot to and from [facility]. Pt with difficulty reaching full standing, but x1 successful attempt. Pt demonstrating limited ROM L hip flexion, knee flexion, and hip adduction today, has difficulty performing on command but observed functionally and when not pushing with RUE. PT to continue to progress pt mobility as able.     Follow Up Recommendations  Supervision/Assistance - 24 hour;SNF     Equipment Recommendations  Wheelchair (measurements PT);Wheelchair cushion (measurements PT)    Recommendations for Other Services       Precautions / Restrictions Precautions Precautions: Fall Precaution Comments: Pt with 2 in hospital falls. Pushers, mild L inattention, impulsive Restrictions Weight Bearing Restrictions: No    Mobility  Bed Mobility Overal bed mobility: Needs Assistance Bed Mobility: Supine to Sit;Sit to Supine     Supine to sit: +2 for safety/equipment;Mod assist Sit to supine: Mod assist;+2 for safety/equipment   General bed mobility comments: mod assist for trunk elevation/lowering with assist of R HHA,  completing LLE lifting in/out of bed. Pt utilizing RLE to scoot LLE to EOB prior to initiating pull to sit.    Transfers Overall transfer level: Needs assistance Equipment used: 2 person hand held assist Transfers: Set designer Transfers;Sit to/from Stand Sit to Stand: Mod assist;+2 physical assistance   Squat pivot transfers: Mod assist;+2 physical assistance     General transfer comment: mod +2 for power up, rise, steady, and cuing RLE and truncal extension to reach upright. STS x2, with squat pivot to/from [facility] towards R, assist for hip translation, LLE close guarding, and pivoting on RLE.  Ambulation/Gait             General Gait Details: nt   Marine scientist Rankin (Stroke Patients Only) Modified Rankin (Stroke Patients Only) Pre-Morbid Rankin Score: No symptoms Modified Rankin: Severe disability     Balance Overall balance assessment: Needs assistance Sitting-balance support: Feet supported;No upper extremity supported Sitting balance-Leahy Scale: Poor Sitting balance - Comments: evolving level of assist (min guard to min/mod assist) to correct L lateral leaning, R pushing behaviors. Pt able to find upright sitting with verbal cuing, benefits from visual cues to upright and midline. EOB sitting x15 minutes with OT facilitating upright, PT assisting pt in finding midline and blocking RLE from pushing L Postural control: Left lateral lean Standing balance support: During functional activity;Single extremity supported Standing balance-Leahy Scale: Poor Standing balance comment: reliant on external assist                            Cognition Arousal/Alertness: Awake/alert Behavior During  Therapy: Flat affect (periods of smiling) Overall Cognitive Status: Impaired/Different from baseline Area of Impairment: Safety/judgement;Attention;Awareness;Problem solving;Following commands                   Current  Attention Level: Selective Memory: Decreased short-term memory Following Commands: Follows one step commands with increased time Safety/Judgement: Decreased awareness of safety;Decreased awareness of deficits Awareness: Emergent Problem Solving: Slow processing;Decreased initiation;Difficulty sequencing;Requires verbal cues;Requires tactile cues General Comments: Pt requires frequent multimodal cuing to avoid pushing behaviors, able to self-correct intermittently after several external correction cues. Pt with decreased postural awareness, benefits from external midline, vertical cues to correct.      Exercises      General Comments        Pertinent Vitals/Pain Pain Assessment: No/denies pain Pain Intervention(s): Monitored during session    Home Living                      Prior Function            PT Goals (current goals can now be found in the care plan section) Acute Rehab PT Goals Patient Stated Goal: to go home PT Goal Formulation: With patient Time For Goal Achievement: 04/06/21 Potential to Achieve Goals: Good Progress towards PT goals: Progressing toward goals    Frequency    Min 3X/week      PT Plan Current plan remains appropriate    Co-evaluation PT/OT/SLP Co-Evaluation/Treatment: Yes Reason for Co-Treatment: For patient/therapist safety;To address functional/ADL transfers;Complexity of the patient's impairments (multi-system involvement) PT goals addressed during session: Mobility/safety with mobility;Balance;Strengthening/ROM        AM-PAC PT "6 Clicks" Mobility   Outcome Measure  Help needed turning from your back to your side while in a flat bed without using bedrails?: A Lot Help needed moving from lying on your back to sitting on the side of a flat bed without using bedrails?: A Lot Help needed moving to and from a bed to a chair (including a wheelchair)?: A Lot Help needed standing up from a chair using your arms (e.g., wheelchair  or bedside chair)?: A Lot Help needed to walk in hospital room?: Total Help needed climbing 3-5 steps with a railing? : Total 6 Click Score: 10    End of Session   Activity Tolerance: Patient tolerated treatment well Patient left: in bed;with call bell/phone within reach;with bed alarm set;Other (comment) Nurse Communication: Mobility status PT Visit Diagnosis: Hemiplegia and hemiparesis;Unsteadiness on feet (R26.81);Muscle weakness (generalized) (M62.81);Difficulty in walking, not elsewhere classified (R26.2);Other symptoms and signs involving the nervous system (R29.898) Hemiplegia - Right/Left: Left Hemiplegia - dominant/non-dominant: Dominant Hemiplegia - caused by: Nontraumatic intracerebral hemorrhage     Time: 1101-1154 PT Time Calculation (min) (ACUTE ONLY): 53 min  Charges:  $Therapeutic Activity: 8-22 mins                     Stacie Glaze, PT DPT Acute Rehabilitation Services Pager [phone]  Office [phone]   [place] E [name] 03/30/2021, 2:02 PM

## 2021-03-30 NOTE — Progress Notes (Signed)
[facility] PROGRESS NOTE  Zedrick Springsteen MWN:[id number] DOB: 03-26-1961 DOA: 02/21/2021 PCP: Patient, No Pcp Per (Inactive)  Status: Remains inpatient appropriate because:Altered mental status, Unsafe d/c plan and Inpatient level of care appropriate due to severity of illness   Dispo: The patient is from: Home              Anticipated d/c is to: SNF-[facility] likely Monday 5/9              Patient currently is medically stable to d/c.    Difficult to place patient Yes-Medicaid pending   Level of care: Med-Surg  Code Status: Full Family Communication: 5/2 [name] [phone]. DVT prophylaxis: SCDs Vaccination status: Fully vaccinated with Moderna vaccine with second shot in August 2021-has not received a booster shot   HPI: 60 year old gentleman prior history of hypertension, cocaine use, tobacco abuse admitted on 02/21/2021 with a code stroke was found to have intracranial hemorrhage in the right basal ganglia secondary to hypertension and cocaine use.  Patient was transferred from neurology service to hospitalist service on 02/26/2021. Therapy evaluations recommending SNF.  pt had a fall on 03/03/21, he underwent CT head showing increased size of the intraparenchymal hematoma with worsening leftward midline shift, now measuring 8 mm.Effaced right lateral ventricle, which may become entrapped and predisposes to hydrocephalus. He was transferred to ICU overnight, neuro surgery consulted and a repeat CT head ordered which was essentially stable .  Pt had another fall in the room on 03/05/21, repeat CT head without contrast showed Unchanged appearance of right basal ganglia intraparenchymal hematoma with 7 mm of leftward midline shift.  Ends admission patient has done well although recommendation from PT and OT is for SNF for further treatments.  Patient stable to discharge but was found to be COVID-positive as of 4/27.  He was mildly symptomatic with fever and cough.  He has  completed 3 days of remdesivir and will be eligible to come off of quarantine as of 5/7.  Please refer to note dictated on 4/27 for expanded details regarding hospitalization.  Subjective: Awake.  States would like a warm blanket placed.  No complaints verbalized  Objective: Vitals:   03/29/21 2315 03/30/21 0338  BP: 102/62 91/68  Pulse: (!) 58 (!) 55  Resp: 18 17  Temp: 98.6 F (37 C) 98.3 F (36.8 C)  SpO2: 99% 98%    Intake/Output Summary (Last 24 hours) at 03/30/2021 0801 Last data filed at 03/29/2021 2125 Gross per 24 hour  Intake --  Output 600 ml  Net -600 ml   Filed Weights   02/25/21 1357  Weight: 73.2 kg    Exam:  Constitutional: Calm, comfortable at rest Cardiovascular: S1-S2, no peripheral edema, regular pulse Abdomen:   LBM 5/2, tender nondistended.  Eating well. Neurologic: Cranial nerves are intact without facial droop, dense left hemiplegia and unable to ambulate.  Sensation intact.  No changes on right side Psychiatric alert and oriented x3.  Pleasant.   Assessment/Plan: Acute problems: COVID 19 infection Now afebrile-no hypoxia-completely asymptomatic at this point Has completed 3 days of Remdesivir.  Apart from observation-no further therapy required.   Will need to complete 10 days of isolation from the day of diagnosis.  This will be on Saturday, May 7 after 12:19 PM   Intracranial hemorrhage with associated brain injury Continue Zoloft 50 mg daily for brain injury related impulsivity Cognitive eval per OT/SLP with documented deficits  Proteus UTI Pan sensitive Only 40,000 colonies but with recent fever and  worsened urinary incontinence will treat w/ IV Rocephin x 3 days  Dysphagia D3 diet as recommended by SLP  Physical Deconditioning -Secondary to recent stroke -PT/OT recommend SNF for rehabilitative therapy -Continue PRAFO shoe   Hypertension Continue hydralazine, Norvasc, clonidine, isosorbide and  lisinopril  Hyperlipidemia Continue statin.  Type 2 diabetes mellitus controlled with hyperglycemia Hemoglobin A1 c is 6.4.  Continue Metformin and SSI     Other problems: Hypokalemia and hyponatremia Resolved  Mild elevation of liver enzymes.  Resolved Unremarkable abdominal ultrasound   Data Reviewed: Basic Metabolic Panel: Recent Labs  Lab 03/24/21 0407 03/25/21 0128  NA 131* 132*  K 4.0 3.8  CL 95* 96*  CO2 27 27  GLUCOSE 94 91  BUN 15 15  CREATININE 0.95 1.09  CALCIUM 9.0 8.9   Liver Function Tests: Recent Labs  Lab 03/24/21 0407 03/25/21 0128  AST 49* 44*  ALT 50* 43  ALKPHOS 43 40  BILITOT 0.6 0.6  PROT 6.2* 6.1*  ALBUMIN 2.9* 2.8*   CBC: Recent Labs  Lab 03/24/21 0407  WBC 5.1  HGB 13.1  HCT 39.9  MCV 87.5  PLT 291   Cardiac Enzymes: Recent Labs  Lab 03/23/21 1244 03/24/21 0407  CKTOTAL 71 65     CBG: Recent Labs  Lab 03/29/21 0651 03/29/21 1142 03/29/21 1853 03/29/21 2121 03/30/21 0603  GLUCAP 101* 108* 104* 141* 110*       Studies: No results found.  Scheduled Meds: . amLODipine  10 mg Oral Daily  . atorvastatin  20 mg Oral Daily  . carvedilol  6.25 mg Oral BID WC  . diclofenac Sodium  4 g Topical QID  . feeding supplement  237 mL Oral TID BM  . hydrALAZINE  100 mg Oral Q8H  . insulin aspart  0-5 Units Subcutaneous QHS  . insulin aspart  0-9 Units Subcutaneous TID WC  . lisinopril  40 mg Oral Daily  . metFORMIN  500 mg Oral Q breakfast  . multivitamin with minerals  1 tablet Oral Daily  . pantoprazole  40 mg Oral BID  . polyethylene glycol  17 g Oral Daily  . senna-docusate  1 tablet Oral BID  . sertraline  50 mg Oral Daily  . tiZANidine  2 mg Oral QHS  . valproic acid  250 mg Oral BID   Continuous Infusions: . cefTRIAXone (ROCEPHIN)  IV      Active Problems:   Intraparenchymal hemorrhage of brain (HCC)   Hypertension   Tobacco abuse   Hyponatremia   Leukocytosis   Controlled type 2 diabetes  mellitus with hyperglycemia, without long-term current use of insulin ([place])   Brain injury with brief loss of consciousness ([place])   Acute pain of right shoulder   URI with cough and congestion   Fever, unspecified   COVID-19 virus infection   Consultants:  Neurology  Neurosurgery  Procedures:  Echocardiogram  Antibiotics: Remdesivir 4/27-4/29  Time spent: 20 minutes    Erin Hearing ANP  [facility] 7 am - 330 pm/M-F for direct patient care and secure chat Please refer to Amion for contact info 37  days

## 2021-03-30 NOTE — TOC Progression Note (Signed)
Transition of Care [facility]) - Progression Note    Patient Details  Name: Chad Coleman MRN: [id number] Date of Birth: July 28, 1961  Transition of Care [facility]) CM/SW [place], RN Phone Number: 03/30/2021, 8:02 AM  Clinical Narrative:    Case management left a secure message for [place], Jenkins at [facility] and notified the facility that the patient will be out of respiratory isolation after noon on Saturday, 04/02/2021.  I will follow up with the facility to determine when bed is available.  CM and MSW with DTP Team will continue to follow the patient for admission to the [facility] facility.   Expected Discharge Plan: [place] Barriers to Discharge: Continued Medical Work up,Inadequate or no insurance,Active Substance Use - Placement  Expected Discharge Plan and Services Expected Discharge Plan: [place] In-house Referral: Clinical Social Work,Chaplain Discharge Planning Services: CM Consult Post Acute Care Choice: [place] Living arrangements for the past 2 months: [place] Determinants of Health (SDOH) Interventions    Readmission Risk Interventions No flowsheet data found.

## 2021-03-30 NOTE — Plan of Care (Signed)
Patient remains free from falls. Safety and Contact precautions maintained.

## 2021-03-31 LAB — GLUCOSE, CAPILLARY
Glucose-Capillary: 87 mg/dL (ref 70–99)
Glucose-Capillary: 90 mg/dL (ref 70–99)
Glucose-Capillary: 114 mg/dL — ABNORMAL HIGH (ref 70–99)
Glucose-Capillary: 105 mg/dL — ABNORMAL HIGH (ref 70–99)

## 2021-03-31 NOTE — Plan of Care (Signed)
  Problem: Coping: Goal: Will verbalize positive feelings about self Outcome: Progressing Goal: Will identify appropriate support needs Outcome: Progressing   Problem: Health Behavior/Discharge Planning: Goal: Ability to manage health-related needs will improve Outcome: Progressing   Problem: Self-Care: Goal: Ability to participate in self-care as condition permits will improve Outcome: Progressing Goal: Verbalization of feelings and concerns over difficulty with self-care will improve Outcome: Progressing Goal: Ability to communicate needs accurately will improve Outcome: Progressing   Problem: Nutrition: Goal: Risk of aspiration will decrease Outcome: Progressing Goal: Dietary intake will improve Outcome: Progressing   Problem: Intracerebral Hemorrhage Tissue Perfusion: Goal: Complications of Intracerebral Hemorrhage will be minimized Outcome: Progressing   Problem: Safety: Goal: Non-violent Restraint(s) Outcome: Progressing   Problem: Education: Goal: Knowledge of General Education information will improve Description: Including pain rating scale, medication(s)/side effects and non-pharmacologic comfort measures Outcome: Progressing   Problem: Health Behavior/Discharge Planning: Goal: Ability to manage health-related needs will improve Outcome: Progressing   Problem: Clinical Measurements: Goal: Ability to maintain clinical measurements within normal limits will improve Outcome: Progressing Goal: Will remain free from infection Outcome: Progressing Goal: Diagnostic test results will improve Outcome: Progressing Goal: Respiratory complications will improve Outcome: Progressing Goal: Cardiovascular complication will be avoided Outcome: Progressing   Problem: Activity: Goal: Risk for activity intolerance will decrease Outcome: Progressing   Problem: Nutrition: Goal: Adequate nutrition will be maintained Outcome: Progressing   Problem: Coping: Goal: Level  of anxiety will decrease Outcome: Progressing   Problem: Elimination: Goal: Will not experience complications related to bowel motility Outcome: Progressing Goal: Will not experience complications related to urinary retention Outcome: Progressing   Problem: Pain Managment: Goal: General experience of comfort will improve Outcome: Progressing   Problem: Safety: Goal: Ability to remain free from injury will improve Outcome: Progressing   Problem: Skin Integrity: Goal: Risk for impaired skin integrity will decrease Outcome: Progressing   Problem: Education: Goal: Knowledge of disease or condition will improve Outcome: Progressing Goal: Knowledge of secondary prevention will improve Outcome: Progressing Goal: Knowledge of patient specific risk factors addressed and post discharge goals established will improve Outcome: Progressing Goal: Individualized Educational Video(s) Outcome: Progressing   Problem: Safety: Goal: Non-violent Restraint(s) Outcome: Progressing   

## 2021-03-31 NOTE — Progress Notes (Signed)
CSW was notified that [facility] DON is stating the patient cannot come to the facility until he is 21 days out from his positive COVID test on 4/27.  CSW spoke with Denyse Amass, [facility] supervisor to request assistance in communicating with facility DON to accept patient sooner.  Madilyn Fireman, MSW, LCSW Transitions of Care  Clinical Social Worker II [phone]

## 2021-03-31 NOTE — Progress Notes (Signed)
[facility] PROGRESS NOTE  Chad Coleman XBJ:[id number] DOB: 1960/12/01 DOA: 02/21/2021 PCP: Patient, No Pcp Per (Inactive)  Status: Remains inpatient appropriate because:Altered mental status, Unsafe d/c plan and Inpatient level of care appropriate due to severity of illness   Dispo: The patient is from: Home              Anticipated d/c is to: SNF[facility] likely Monday 5/9              Patient currently is medically stable to d/c.    Difficult to place patient Yes-Medicaid pending   Level of care: Med-Surg  Code Status: Full Family Communication: 5/2 [name] [phone]. DVT prophylaxis: SCDs Vaccination status: Fully vaccinated with Moderna vaccine with second shot in August 2021-has not received a booster shot   HPI: 60 year old gentleman prior history of hypertension, cocaine use, tobacco abuse admitted on 02/21/2021 with a code stroke was found to have intracranial hemorrhage in the right basal ganglia secondary to hypertension and cocaine use.  Patient was transferred from neurology service to hospitalist service on 02/26/2021. Therapy evaluations recommending SNF.  pt had a fall on 03/03/21, he underwent CT head showing increased size of the intraparenchymal hematoma with worsening leftward midline shift, now measuring 8 mm.Effaced right lateral ventricle, which may become entrapped and predisposes to hydrocephalus. He was transferred to ICU overnight, neuro surgery consulted and a repeat CT head ordered which was essentially stable .  Pt had another fall in the room on 03/05/21, repeat CT head without contrast showed Unchanged appearance of right basal ganglia intraparenchymal hematoma with 7 mm of leftward midline shift.  Ends admission patient has done well although recommendation from PT and OT is for SNF for further treatments.  Patient stable to discharge but was found to be COVID-positive as of 4/27.  He was mildly symptomatic with fever and cough.  He has  completed 3 days of remdesivir and will be eligible to come off of quarantine as of 5/7.  Please refer to note dictated on 4/27 for expanded details regarding hospitalization.  Subjective: Had questions regarding when quarantine will be removed.  Reemphasized that will be removed by lunchtime on Saturday.  Today is Thursday.  Objective: Vitals:   03/30/21 2352 03/31/21 0420  BP: 126/81 111/72  Pulse: 80 68  Resp: 16 17  Temp: 98.4 F (36.9 C) 98.7 F (37.1 C)  SpO2: 96% 97%    Intake/Output Summary (Last 24 hours) at 03/31/2021 0824 Last data filed at 03/31/2021 6295 Gross per 24 hour  Intake 340 ml  Output 1850 ml  Net -1510 ml   Filed Weights   02/25/21 1357  Weight: 73.2 kg    Exam:  Constitutional: Calm, comfortable, no acute distress Cardiovascular: Normal heart sounds.  Regular pulse. Abdomen:   LBM 5/2, tender nondistended.  Normoactive bowel sound Neurologic: Cranial nerves are intact without facial droop, dense left hemiplegia and unable to ambulate.  Sensation intact.  Strength 5/5 on right. Psychiatric Pleasant, alert and oriented x3   Assessment/Plan: Acute problems: COVID 19 infection Now afebrile-no hypoxia-completely asymptomatic at this point Has completed 3 days of Remdesivir.  Apart from observation-no further therapy required.   Will need to complete 10 days of isolation from the day of diagnosis given he did not have a severe case of COVID and did not have pneumonia.  This will be on Saturday, May 7 after 12:19 PM   Intracranial hemorrhage with associated brain injury Continue Zoloft 50 mg daily for brain  injury related impulsivity Cognitive eval per OT/SLP with documented deficits  Proteus UTI Pan sensitive Only 40,000 colonies but with recent fever and worsened urinary incontinence will treat w/ IV Rocephin x 3 days  Dysphagia D3 diet as recommended by SLP  Physical Deconditioning -Secondary to recent stroke -PT/OT recommend SNF for  rehabilitative therapy -Continue PRAFO shoe   Hypertension Continue hydralazine, Norvasc, clonidine, isosorbide and lisinopril  Hyperlipidemia Continue statin.  Type 2 diabetes mellitus controlled with hyperglycemia Hemoglobin A1 c is 6.4.  Continue Metformin and SSI     Other problems: Hypokalemia and hyponatremia Resolved  Mild elevation of liver enzymes.  Resolved Unremarkable abdominal ultrasound   Data Reviewed: Basic Metabolic Panel: Recent Labs  Lab 03/25/21 0128  NA 132*  K 3.8  CL 96*  CO2 27  GLUCOSE 91  BUN 15  CREATININE 1.09  CALCIUM 8.9   Liver Function Tests: Recent Labs  Lab 03/25/21 0128  AST 44*  ALT 43  ALKPHOS 40  BILITOT 0.6  PROT 6.1*  ALBUMIN 2.8*   CBC: No results for input(s): WBC, NEUTROABS, HGB, HCT, MCV, PLT in the last 168 hours. Cardiac Enzymes: No results for input(s): CKTOTAL, CKMB, CKMBINDEX, TROPONINI in the last 168 hours.   CBG: Recent Labs  Lab 03/30/21 0823 03/30/21 1209 03/30/21 1636 03/30/21 2132 03/31/21 0631  GLUCAP 109* 102* 125* 92 87       Studies: No results found.  Scheduled Meds: . amLODipine  10 mg Oral Daily  . atorvastatin  20 mg Oral Daily  . carvedilol  6.25 mg Oral BID WC  . diclofenac Sodium  4 g Topical QID  . feeding supplement  237 mL Oral TID BM  . hydrALAZINE  100 mg Oral Q8H  . insulin aspart  0-5 Units Subcutaneous QHS  . insulin aspart  0-9 Units Subcutaneous TID WC  . lisinopril  40 mg Oral Daily  . metFORMIN  500 mg Oral Q breakfast  . multivitamin with minerals  1 tablet Oral Daily  . pantoprazole  40 mg Oral BID  . polyethylene glycol  17 g Oral Daily  . senna-docusate  1 tablet Oral BID  . sertraline  50 mg Oral Daily  . tiZANidine  2 mg Oral QHS  . valproic acid  250 mg Oral BID   Continuous Infusions: . cefTRIAXone (ROCEPHIN)  IV Stopped (03/30/21 1126)    Active Problems:   Intraparenchymal hemorrhage of brain (HCC)   Hypertension   Tobacco  abuse   Hyponatremia   Leukocytosis   Controlled type 2 diabetes mellitus with hyperglycemia, without long-term current use of insulin ([place])   Brain injury with brief loss of consciousness ([place])   Acute pain of right shoulder   URI with cough and congestion   Fever, unspecified   COVID-19 virus infection   Consultants:  Neurology  Neurosurgery  Procedures:  Echocardiogram  Antibiotics: Remdesivir 4/27-4/29  Time spent: 20 minutes    Erin Hearing ANP  [facility] 7 am - 330 pm/M-F for direct patient care and secure chat Please refer to Amion for contact info 38  days

## 2021-04-01 LAB — GLUCOSE, CAPILLARY
Glucose-Capillary: 99 mg/dL (ref 70–99)
Glucose-Capillary: 108 mg/dL — ABNORMAL HIGH (ref 70–99)
Glucose-Capillary: 100 mg/dL — ABNORMAL HIGH (ref 70–99)
Glucose-Capillary: 107 mg/dL — ABNORMAL HIGH (ref 70–99)

## 2021-04-01 NOTE — Plan of Care (Signed)
  Problem: Coping: °Goal: Will verbalize positive feelings about self °Outcome: Progressing °Goal: Will identify appropriate support needs °Outcome: Progressing °  °Problem: Clinical Measurements: °Goal: Ability to maintain clinical measurements within normal limits will improve °Outcome: Progressing °Goal: Will remain free from infection °Outcome: Progressing °Goal: Diagnostic test results will improve °Outcome: Progressing °Goal: Respiratory complications will improve °Outcome: Progressing °Goal: Cardiovascular complication will be avoided °Outcome: Progressing °  °Problem: Intracerebral Hemorrhage Tissue Perfusion: °Goal: Complications of Intracerebral Hemorrhage will be minimized °Outcome: Progressing °  °

## 2021-04-01 NOTE — Progress Notes (Signed)
[facility] PROGRESS NOTE  Chad Coleman FOY:[id number] DOB: 1961/07/27 DOA: 02/21/2021 PCP: Patient, No Pcp Per (Inactive)  Status: Remains inpatient appropriate because:Altered mental status, Unsafe d/c plan and Inpatient level of care appropriate due to severity of illness   Dispo: The patient is from: Home              Anticipated d/c is to: SNF[facility] -discharge delayed due to facility requirement of a full 21-day quarantine post initial positive COVID test -at [facility] the patient will come off isolation Saturday 5/7.  Based on SNF criteria patient will need to spend an additional 11 days in the hospital before discharge.              Patient currently is medically stable to d/c.    Difficult to place patient Yes-Medicaid pending   Level of care: Med-Surg  Code Status: Full Family Communication: 5/2 [name] [phone]. DVT prophylaxis: SCDs Vaccination status: Fully vaccinated with Moderna vaccine with second shot in August 2021-has not received a booster shot   HPI: 60 year old gentleman prior history of hypertension, cocaine use, tobacco abuse admitted on 02/21/2021 with a code stroke was found to have intracranial hemorrhage in the right basal ganglia secondary to hypertension and cocaine use.  Patient was transferred from neurology service to hospitalist service on 02/26/2021. Therapy evaluations recommending SNF.  pt had a fall on 03/03/21, he underwent CT head showing increased size of the intraparenchymal hematoma with worsening leftward midline shift, now measuring 8 mm.Effaced right lateral ventricle, which may become entrapped and predisposes to hydrocephalus. He was transferred to ICU overnight, neuro surgery consulted and a repeat CT head ordered which was essentially stable .  Pt had another fall in the room on 03/05/21, repeat CT head without contrast showed Unchanged appearance of right basal ganglia intraparenchymal hematoma with 7 mm of leftward  midline shift.  Ends admission patient has done well although recommendation from PT and OT is for SNF for further treatments.  Patient stable to discharge but was found to be COVID-positive as of 4/27.  He was mildly symptomatic with fever and cough.  He has completed 3 days of remdesivir and will be eligible to come off of quarantine as of 5/7.  Please refer to note dictated on 4/27 for expanded details regarding hospitalization.  Subjective: Awakened.  Updated on discontinuation of quarantine as of tomorrow at lunch.  He is also aware that nursing home has informed us they have a longer quarantine.  Therefore he will need to remain in the hospital and additional 11 days.  Informed him that we are working with nursing home to see if we can remove this recommendation.  Objective: Vitals:   03/31/21 2014 04/01/21 0019  BP: 130/78 104/67  Pulse: 69 60  Resp: 18 18  Temp: 98.5 F (36.9 C) 98.4 F (36.9 C)  SpO2: 95% 97%    Intake/Output Summary (Last 24 hours) at 04/01/2021 0746 Last data filed at 04/01/2021 0651 Gross per 24 hour  Intake 300 ml  Output 1000 ml  Net -700 ml   Filed Weights   02/25/21 1357  Weight: 73.2 kg    Exam:  Constitutional: Awake, calm, no acute distress Cardiovascular: S1-S2, regular pulse, normotensive, no peripheral edema Abdomen:   LBM 5/3, soft nontender nondistended with normoactive bowel sounds.  Ate about 50% of breakfast tray. Neurologic: Cranial nerves are intact without facial droop, dense left hemiplegia and unable to ambulate.  Sensation intact.  Strength 5/5 on right. Psychiatric  awake and oriented x3 although does have some short-term memory deficits.  Pleasant affect.   Assessment/Plan: Acute problems: Mild COVID 19 infection without pneumonia Now afebrile-no hypoxia-completely asymptomatic at this point Has completed 3 days of Remdesivir.  Apart from observation-no further therapy required.   Will need to complete 10 days of isolation  from the day of diagnosis given he did not have a severe case of COVID and did not have pneumonia.  This will be on Saturday, May 7 after 12:19 PM   Intracranial hemorrhage with associated brain injury Zoloft 50 mg daily for brain injury related impulsivity Cognitive eval per OT/SLP with documented deficits  Proteus UTI Pan sensitive Has completed 3 days of IV Rocephin  Dysphagia D3 diet as recommended by SLP  Physical Deconditioning -Secondary to recent stroke -PT/OT recommend SNF for rehabilitative therapy -Continue PRAFO shoe   Hypertension Continue hydralazine, Norvasc, clonidine, isosorbide and lisinopril  Hyperlipidemia Continue statin.  Type 2 diabetes mellitus controlled with hyperglycemia Hemoglobin A1 c is 6.4.  Metformin and SSI     Other problems: Hypokalemia and hyponatremia Resolved  Mild elevation of liver enzymes.  Resolved Unremarkable abdominal ultrasound   Data Reviewed: Basic Metabolic Panel: No results for input(s): NA, K, CL, CO2, GLUCOSE, BUN, CREATININE, CALCIUM, MG, PHOS in the last 168 hours. Liver Function Tests: No results for input(s): AST, ALT, ALKPHOS, BILITOT, PROT, ALBUMIN in the last 168 hours. CBC: No results for input(s): WBC, NEUTROABS, HGB, HCT, MCV, PLT in the last 168 hours. Cardiac Enzymes: No results for input(s): CKTOTAL, CKMB, CKMBINDEX, TROPONINI in the last 168 hours.   CBG: Recent Labs  Lab 03/31/21 0631 03/31/21 1252 03/31/21 1635 03/31/21 2109 04/01/21 0600  GLUCAP 87 90 114* 105* 99       Studies: No results found.  Scheduled Meds: . amLODipine  10 mg Oral Daily  . atorvastatin  20 mg Oral Daily  . carvedilol  6.25 mg Oral BID WC  . diclofenac Sodium  4 g Topical QID  . feeding supplement  237 mL Oral TID BM  . hydrALAZINE  100 mg Oral Q8H  . insulin aspart  0-5 Units Subcutaneous QHS  . insulin aspart  0-9 Units Subcutaneous TID WC  . lisinopril  40 mg Oral Daily  . metFORMIN  500 mg  Oral Q breakfast  . multivitamin with minerals  1 tablet Oral Daily  . pantoprazole  40 mg Oral BID  . polyethylene glycol  17 g Oral Daily  . senna-docusate  1 tablet Oral BID  . sertraline  50 mg Oral Daily  . tiZANidine  2 mg Oral QHS  . valproic acid  250 mg Oral BID   Continuous Infusions: . cefTRIAXone (ROCEPHIN)  IV 1 g (03/31/21 0910)    Active Problems:   Intraparenchymal hemorrhage of brain ([place])   Hypertension   Tobacco abuse   Hyponatremia   Leukocytosis   Controlled type 2 diabetes mellitus with hyperglycemia, without long-term current use of insulin ([place])   Brain injury with brief loss of consciousness ([place])   Acute pain of right shoulder   URI with cough and congestion   Fever, unspecified   COVID-19 virus infection   Consultants:  Neurology  Neurosurgery  Procedures:  Echocardiogram  Antibiotics: Remdesivir 4/27-4/29  Time spent: 20 minutes    Erin Hearing ANP  [facility] 7 am - 330 pm/M-F for direct patient care and secure chat Please refer to Amion for contact info 39  days

## 2021-04-01 NOTE — Progress Notes (Signed)
Occupational Therapy Treatment Patient Details Name: Chad Coleman MRN: [id number] DOB: 17-Jan-1961 Today's Date: 04/01/2021    History of present illness 60 yo male presents to [facility] on 3/28 with acute onset of L weakness, numbness, and L facial droop. CTH R BG ICH, suspect hypertensive.  Fall on 02/28/21. imaging showing increased in R intraparenchymal hematoma with worsening leftward midline shift on 4/7. Tested postive for COVID on 4/27. PMH includes hypertension, tobacco use, noncompliance with antihypertensive medications, and cocaine abuse.   OT comments  Pt seen in conjunction with PT.  He demonstrates improved activation of Lt UE and Lt LE musculature.  He demonstrates significant improvement in trunk control with less pushing and increased ability to correct his posture in response to balance challenges.  He was able to don his socks EOB with mod A, and side stepped up the EOB with mod A +2.    Follow Up Recommendations  SNF    Equipment Recommendations  None recommended by OT    Recommendations for Other Services      Precautions / Restrictions Precautions Precautions: Fall Precaution Comments: Pt with 2 in hospital falls. Pushers, mild L inattention, impulsive       Mobility Bed Mobility Overal bed mobility: Needs Assistance Bed Mobility: Supine to Sit;Sit to Supine     Supine to sit: Mod assist Sit to supine: Mod assist   General bed mobility comments: verbal cues and faciltiation to bridge and scoot hips across bed.  Max A/facilitation to lift Lt LE back onto the bed    Transfers Overall transfer level: Needs assistance Equipment used: 2 person hand held assist   Sit to Stand: Mod assist         General transfer comment: assist to boost into standing.  He was able to side step up the EOB with mod A +2 with facilitation at Lt hip and Lt tibia to prevent knee buckling.  Assist provided for balance    Balance Overall balance assessment: Needs  assistance Sitting-balance support: Feet supported;No upper extremity supported Sitting balance-Leahy Scale: Poor Sitting balance - Comments: pt significantly improved this date.  He required min guard <> mod A to maintain EOB sitting both statically and dynamically.  He demonstrated less pushing and improved ability to recognize his LOB and initiate a correction   Standing balance support: During functional activity;Single extremity supported Standing balance-Leahy Scale: Poor Standing balance comment: reliant on external assist.  Requires mod A +2 with facilitation at Lt hip and tibia to prevent knee buckling.  Assist for balance                           ADL either performed or assessed with clinical judgement   ADL Overall ADL's : Needs assistance/impaired                     Lower Body Dressing: Moderate assistance Lower Body Dressing Details (indicate cue type and reason): Pt able to don socks while sitting EOB.  He was able to don the Rt sock with min A for balance, and required mod A for balance for the Lt sock and to thread sock over toes Toilet Transfer: Moderate assistance;+2 for safety/equipment;+2 for physical assistance;Stand-pivot;BSC           Functional mobility during ADLs: Moderate assistance;+2 for physical assistance;+2 for safety/equipment       Vision   Additional Comments: pt attending better to Lt side this date  Perception     Praxis      Cognition Arousal/Alertness: Awake/alert Behavior During Therapy: WFL for tasks assessed/performed;Flat affect Overall Cognitive Status: Impaired/Different from baseline Area of Impairment: Attention;Following commands;Safety/judgement;Problem solving                   Current Attention Level: Selective Memory: Decreased short-term memory Following Commands: Follows one step commands consistently;Follows multi-step commands inconsistently Safety/Judgement: Decreased awareness of  safety Awareness: Emergent Problem Solving: Difficulty sequencing;Requires verbal cues;Slow processing General Comments: Pt demontrates improved speed of processing this date, and required less cues for problem solving and safety        Exercises Other Exercises Other Exercises: worked on facilitation of trunk control and weight shifting EOB Other Exercises: While standing worked on facilitation of weight shifiting and unweighting Rt foot   Shoulder Instructions       General Comments      Pertinent Vitals/ Pain       Pain Assessment: No/denies pain  Home Living                                          Prior Functioning/Environment              Frequency  Min 2X/week        Progress Toward Goals  OT Goals(current goals can now be found in the care plan section)  Progress towards OT goals: Progressing toward goals     Plan Discharge plan remains appropriate    Co-evaluation    PT/OT/SLP Co-Evaluation/Treatment: Yes Reason for Co-Treatment: To address functional/ADL transfers;For patient/therapist safety   OT goals addressed during session: ADL's and self-care      AM-PAC OT "6 Clicks" Daily Activity     Outcome Measure   Help from another person eating meals?: A Little Help from another person taking care of personal grooming?: A Little Help from another person toileting, which includes using toliet, bedpan, or urinal?: A Lot Help from another person bathing (including washing, rinsing, drying)?: A Lot Help from another person to put on and taking off regular upper body clothing?: A Lot Help from another person to put on and taking off regular lower body clothing?: A Lot 6 Click Score: 14    End of Session Equipment Utilized During Treatment: Gait belt;Other (comment)  OT Visit Diagnosis: Unsteadiness on feet (R26.81);Other abnormalities of gait and mobility (R26.89);Muscle weakness (generalized) (M62.81);Hemiplegia and  hemiparesis;Pain Hemiplegia - Right/Left: Left Hemiplegia - dominant/non-dominant: Dominant Hemiplegia - caused by: Cerebral infarction Pain - Right/Left: Left   Activity Tolerance Patient tolerated treatment well   Patient Left in bed;with call bell/phone within reach;with bed alarm set   Nurse Communication Mobility status        Time: 0737-1062 OT Time Calculation (min): 40 min  Charges: OT General Charges $OT Visit: 1 Visit OT Treatments $Neuromuscular Re-education: 23-37 mins  Nilsa Nutting., OTR/L Acute Rehabilitation Services Pager [phone] Office [phone]    Lucille Passy M 04/01/2021, 5:02 PM

## 2021-04-01 NOTE — Progress Notes (Signed)
Physical Therapy Treatment Patient Details Name: Chad Coleman MRN: [id number] DOB: 27-Jun-1961 Today's Date: 04/01/2021    History of Present Illness 60 yo male presents to [facility] on 3/28 with acute onset of L weakness, numbness, and L facial droop. CTH R BG ICH, suspect hypertensive.  Fall on 02/28/21. imaging showing increased in R intraparenchymal hematoma with worsening leftward midline shift on 4/7. Tested postive for COVID on 4/27. PMH includes hypertension, tobacco use, noncompliance with antihypertensive medications, and cocaine abuse.    PT Comments    Pt with improving truncal control and postural awareness this session, frequently correcting posture to upright without PT or OT cuing. Pt tolerating increasing balance challenge in sitting tasks as well. Pt stood x1 minute with truncal/chest facilitation, LLE blocking. Pt is motivated by his progress thus far, will continue to progress pt standing/pre-gait/early gait as tolerated.    Follow Up Recommendations  Supervision/Assistance - 24 hour;SNF     Equipment Recommendations  Wheelchair (measurements PT);Wheelchair cushion (measurements PT)    Recommendations for Other Services       Precautions / Restrictions Precautions Precautions: Fall Precaution Comments: Pt with 2 in hospital falls. Pushers, mild L inattention, impulsive Restrictions Weight Bearing Restrictions: No    Mobility  Bed Mobility Overal bed mobility: Needs Assistance Bed Mobility: Supine to Sit;Sit to Supine     Supine to sit: Mod assist Sit to supine: Mod assist   General bed mobility comments: verbal cues and faciltiation to bridge and scoot hips across bed.  Max A/facilitation to lift Lt LE back onto the bed    Transfers Overall transfer level: Needs assistance Equipment used: 2 person hand held assist   Sit to Stand: Mod assist;+2 physical assistance;+2 safety/equipment         General transfer comment: assist to boost into standing.  He  was able to side step up the EOB with mod A +2 with facilitation at Lt hip and Lt tibia to prevent knee buckling, additional chest opening and posterior pelvic facilitation to maintain upright balance/posture. Standing tolerance x1 minute.  Assist provided for balance  Ambulation/Gait                 Stairs             Wheelchair Mobility    Modified Rankin (Stroke Patients Only) Modified Rankin (Stroke Patients Only) Pre-Morbid Rankin Score: No symptoms Modified Rankin: Severe disability     Balance Overall balance assessment: Needs assistance Sitting-balance support: Feet supported;No upper extremity supported Sitting balance-Leahy Scale: Poor Sitting balance - Comments: pt significantly improved this date.  He required min guard <> mod A to maintain EOB sitting both statically and dynamically.  He demonstrated less pushing and improved ability to recognize his LOB and initiate a correction   Standing balance support: During functional activity;Single extremity supported Standing balance-Leahy Scale: Poor Standing balance comment: reliant on external assist.  Requires mod A +2 with facilitation at Lt hip and tibia to prevent knee buckling.  Assist for balance                            Cognition Arousal/Alertness: Awake/alert Behavior During Therapy: WFL for tasks assessed/performed;Flat affect Overall Cognitive Status: Impaired/Different from baseline Area of Impairment: Attention;Following commands;Safety/judgement;Problem solving                   Current Attention Level: Selective Memory: Decreased short-term memory Following Commands: Follows one step commands consistently;Follows multi-step  commands inconsistently Safety/Judgement: Decreased awareness of safety Awareness: Emergent Problem Solving: Difficulty sequencing;Requires verbal cues;Slow processing General Comments: Pt demontrates improved speed of processing this date, and  required less cues for problem solving and safety      Exercises Other Exercises Other Exercises: worked on facilitation of trunk control and weight shifting EOB, multidirectional perturbations in sitting to challenge pt balance reactions Other Exercises: While standing worked on facilitation of weight shifiting and unweighting Rt foot    General Comments        Pertinent Vitals/Pain Pain Assessment: No/denies pain    Home Living                      Prior Function            PT Goals (current goals can now be found in the care plan section) Acute Rehab PT Goals Patient Stated Goal: to go home PT Goal Formulation: With patient Time For Goal Achievement: 04/06/21 Potential to Achieve Goals: Good Progress towards PT goals: Progressing toward goals    Frequency    Min 3X/week      PT Plan Current plan remains appropriate    Co-evaluation PT/OT/SLP Co-Evaluation/Treatment: Yes Reason for Co-Treatment: For patient/therapist safety;To address functional/ADL transfers PT goals addressed during session: Mobility/safety with mobility;Balance;Strengthening/ROM OT goals addressed during session: ADL's and self-care      AM-PAC PT "6 Clicks" Mobility   Outcome Measure  Help needed turning from your back to your side while in a flat bed without using bedrails?: A Lot Help needed moving from lying on your back to sitting on the side of a flat bed without using bedrails?: A Lot Help needed moving to and from a bed to a chair (including a wheelchair)?: A Lot Help needed standing up from a chair using your arms (e.g., wheelchair or bedside chair)?: A Lot Help needed to walk in hospital room?: Total Help needed climbing 3-5 steps with a railing? : Total 6 Click Score: 10    End of Session   Activity Tolerance: Patient tolerated treatment well Patient left: in bed;with call bell/phone within reach;with bed alarm set Nurse Communication: Mobility status PT Visit  Diagnosis: Hemiplegia and hemiparesis;Unsteadiness on feet (R26.81);Muscle weakness (generalized) (M62.81);Difficulty in walking, not elsewhere classified (R26.2);Other symptoms and signs involving the nervous system (R29.898) Hemiplegia - Right/Left: Left Hemiplegia - dominant/non-dominant: Dominant Hemiplegia - caused by: Nontraumatic intracerebral hemorrhage     Time: 1445-1525 PT Time Calculation (min) (ACUTE ONLY): 40 min  Charges:  $Therapeutic Activity: 8-22 mins                     Stacie Glaze, PT DPT Acute Rehabilitation Services Pager [phone]  Office [phone]    Roxine Caddy E [name] 04/01/2021, 5:20 PM

## 2021-04-01 NOTE — TOC Progression Note (Addendum)
Transition of Care [facility]) - Progression Note    Patient Details  Name: Chad Coleman MRN: [id number] Date of Birth: Jan 13, 1961  [facility] CM/SW Contact  Curlene Labrum, RN Phone Number: 04/01/2021, 11:49 AM  Clinical Narrative:    Case management spoke with [facility] and the facility is unable to admit the patient to the facility til 04/13/2021 until they are out of isolation after 22 days.  Barbette Or, MSW supervisor is aware.  CM called and left a message with Gayla Medicus, CM with [facility] and asked that she review the patient's clinicals to establish another SNF that may admit the patient to their facility for care for LTC placement.  CM and MSW will continue to explore options for [facility] placement.  04/01/2021 1444 - Called and spoke with Gayla Medicus, CM of [facility] and they no not have any available SNf beds to offer to the patient at this time for placement.   Expected Discharge Plan: [place] Barriers to Discharge: Continued Medical Work up,Inadequate or no insurance,Active Substance Use - Placement  Expected Discharge Plan and Services Expected Discharge Plan: [place] In-house Referral: Clinical Social Work,Chaplain Discharge Planning Services: CM Consult Post Acute Care [facility]: [place] Living arrangements for the past 2 months: [place] Determinants of Health (SDOH) Interventions    Readmission Risk Interventions No flowsheet data found.

## 2021-04-01 NOTE — Progress Notes (Signed)
  Speech Language Pathology Treatment: Cognitive-Linquistic  Patient Details Name: Chad Coleman MRN: [id number] DOB: [birth year] Today's Date: 04/01/2021 Time: 1000-1014 SLP Time Calculation (min) (ACUTE ONLY): 14 min  Assessment / Plan / Recommendation Clinical Impression  Pt encountered feeding himself last part of breakfast.  He was able to recall basic precautions to address pocketing in left side of mouth, however continues to require verbal cues for follow-through. There were no concerns for aspiration. Oriented.  Has persisting deficits in awareness and difficulty understanding impact of deficits on function. Demonstrated improved selective attention and storage of verbal information.  Required intermittent verbal cues to locate call bell and phone and search breakfast tray for food items/utensils. SLP will continue to follow.   HPI HPI: 60 y.o. male with a medical history significant for hypertension, tobacco use, noncompliance with antihypertensive medications, and cocaine abuse who presented to the ED via EMS as a Code Stroke for evaluation of acute onset of left-sided weakness, numbness, and left facial droop. Dx right basal ganglia ICH. Evening of 4/7 pt fell from bed with CT revealing Increased size of 5.8 x 3.6 cm right intraparenchymal hematoma  with worsening leftward midline shift and eval. 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, [name] Office number [phone] Pager [phone]

## 2021-04-02 LAB — GLUCOSE, CAPILLARY
Glucose-Capillary: 88 mg/dL (ref 70–99)
Glucose-Capillary: 86 mg/dL (ref 70–99)
Glucose-Capillary: 101 mg/dL — ABNORMAL HIGH (ref 70–99)
Glucose-Capillary: 89 mg/dL (ref 70–99)

## 2021-04-02 NOTE — Plan of Care (Signed)
  Problem: Coping: Goal: Will verbalize positive feelings about self Outcome: Progressing Goal: Will identify appropriate support needs Outcome: Progressing   Problem: Intracerebral Hemorrhage Tissue Perfusion: Goal: Complications of Intracerebral Hemorrhage will be minimized Outcome: Progressing   Problem: Safety: Goal: Non-violent Restraint(s) Outcome: Progressing

## 2021-04-02 NOTE — Progress Notes (Signed)
PROGRESS NOTE                                                                                                                                                                                                             Patient Demographics:    Chad Coleman, is a 60 y.o. male, DOB - 1961-08-08, ULA:[id number]  Outpatient Primary MD for the patient is Patient, No Pcp Per (Inactive)   Admit date - 02/21/2021   LOS - 19  Chief Complaint  Patient presents with  . Weakness       Brief Narrative: Patient is a 60 y.o. 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  While in the hospital-patient sustained a mechanical fall-following which we he had worsening of his intraparenchymal hematoma-requiring transfer to the ICU-neurosurgery consulted.  Upon further stability-he was transferred back to telemetry.  Further hospital course was complicated by fever-and was found to have COVID-19 infection.  COVID-19 vaccinated status: Vaccinated but not boosted.   Subjective:   No chest pain/shortness of breath   Assessment  & Plan :   COVID 19 infection: Now afebrile-no hypoxia-completely asymptomatic at this point-has completed 3 days of Remdesivir.  Apart from observation-no further therapy required.  We will plan on 10 days of isolation from the day of diagnosis-last day of isolation 5/7.    ICH: Continues to have dense left-sided deficits-remains on a dysphagia 3 diet-continue PT/OT eval.  Social work following for placement.  Dysphagia: Secondary to above-has been advanced to a regular diet.  Hyponatremia: Mild-stable for monitoring without any further work-up.  Mild transaminitis: Likely due to COVID-19-stable for monitoring.  HTN: BP reasonable-continue  hydralazine, Norvasc, clonidine, Imdur and lisinopril.  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  Reviewed prior notes-neurology discussed with radiology-this appears to be a benign issue-imaging planned in the outpatient setting.  GERD: Continue PPI  Cocaine/tobacco use: Counseled  ABG:    Component Value Date/Time   TCO2 29 02/21/2021 1737    Vent Settings: N/A  Condition - Stable  Family Communication  : None at bedside  Code Status :  Full Code  Diet :  Diet Order            Diet regular Room service appropriate? Yes; Fluid consistency: Thin  Diet effective now                  Disposition Plan  :   Status is: Inpatient  Remains inpatient appropriate because:Inpatient level of care appropriate due to severity of illness   Dispo: The patient is from: Home              Anticipated d/c is to: SNF              Patient currently is medically stable to d/c.   Difficult to place patient No    Barriers to discharge: Awaiting SNF bed  Antimicorbials  :    Anti-infectives (From admission, onward)   Start     Dose/Rate Route Frequency Ordered Stop   03/30/21 0900  cefTRIAXone (ROCEPHIN) 1 g in sodium chloride 0.9 % 100 mL IVPB        1 g 200 mL/hr over 30 Minutes Intravenous Every 24 hours 03/30/21 0800 04/01/21 1750   03/24/21 1000  remdesivir 100 mg in sodium chloride 0.9 % 100 mL IVPB       "Followed by" Linked Group Details   100 mg 200 mL/hr over 30 Minutes Intravenous Daily 03/23/21 1302 03/25/21 1001   03/23/21 1400  remdesivir 200 mg in sodium chloride 0.9% 250 mL IVPB       "Followed by" Linked Group Details   200 mg 580 mL/hr over 30  Minutes Intravenous Once 03/23/21 1302 03/23/21 1531      Inpatient Medications  Scheduled Meds: . amLODipine  10 mg Oral Daily  . atorvastatin  20 mg Oral Daily  . carvedilol  6.25 mg Oral BID WC  . diclofenac Sodium  4 g Topical QID  . feeding supplement  237 mL Oral TID BM  . hydrALAZINE  100 mg Oral Q8H  . insulin aspart  0-5 Units Subcutaneous QHS  . insulin aspart  0-9 Units Subcutaneous TID WC  . lisinopril  40 mg Oral Daily  . metFORMIN  500 mg Oral Q breakfast  . multivitamin with minerals  1 tablet Oral Daily  . pantoprazole  40 mg Oral BID  . polyethylene glycol  17 g Oral Daily  . senna-docusate  1 tablet Oral BID  . sertraline  50 mg Oral Daily  . tiZANidine  2 mg Oral QHS  . valproic acid  250 mg Oral BID   Continuous Infusions:  PRN Meds:.acetaminophen **OR** acetaminophen (TYLENOL) oral liquid 160 mg/5 mL **OR** acetaminophen, alum & mag hydroxide-simeth, hydrALAZINE, hydroxypropyl methylcellulose / hypromellose, labetalol, melatonin, ondansetron (ZOFRAN) IV   Time Spent in minutes  15  See all Orders from today for further details   Oren Binet M.D on 04/02/2021 at 3:39 PM  To page go to [email or web address] - use universal password  [facility] -  Office  [phone]    Objective:   Vitals:   04/01/21 2325 04/02/21 0357 04/02/21 0804 04/02/21 1230  BP: 116/77 97/66 103/68 136/86  Pulse: 71 63 65 69  Resp: 17 18    Temp: 98.3 F (36.8 C) 98.5 F (36.9 C) 99.3 F (37.4 C) 98.9 F (  37.2 C)  TempSrc: Oral Oral Oral Oral  SpO2: 95% 99% 99% 99%  Weight:      Height:        Wt Readings from Last 3 Encounters:  02/25/21 73.2 kg  02/10/21 73.9 kg  09/30/20 78.5 kg     Intake/Output Summary (Last 24 hours) at 04/02/2021 1539 Last data filed at 04/02/2021 [id number] Gross per 24 hour  Intake 360 ml  Output 700 ml  Net -340 ml     Physical Exam Gen Exam:Alert awake-not in any distress HEENT:atraumatic, normocephalic Chest: B/L clear to  auscultation anteriorly CVS:S1S2 regular Abdomen:soft non tender, non distended Extremities:no edema Neurology: Left-sided hemiplegia. Skin: no rash   Data Review:    CBC No results for input(s): WBC, HGB, HCT, PLT, MCV, MCH, MCHC, RDW, LYMPHSABS, MONOABS, EOSABS, BASOSABS, BANDABS in the last 168 hours.  Invalid input(s): NEUTRABS, BANDSABD  Chemistries  No results for input(s): NA, K, CL, CO2, GLUCOSE, BUN, CREATININE, CALCIUM, MG, AST, ALT, ALKPHOS, BILITOT in the last 168 hours.  Invalid input(s): GFRCGP ------------------------------------------------------------------------------------------------------------------ No results for input(s): CHOL, HDL, LDLCALC, TRIG, CHOLHDL, LDLDIRECT in the last 72 hours.  Lab Results  Component Value Date   HGBA1C 6.4 (H) 02/22/2021   ------------------------------------------------------------------------------------------------------------------ No results for input(s): TSH, T4TOTAL, T3FREE, THYROIDAB in the last 72 hours.  Invalid input(s): FREET3 ------------------------------------------------------------------------------------------------------------------ No results for input(s): VITAMINB12, FOLATE, FERRITIN, TIBC, IRON, RETICCTPCT in the last 72 hours.  Coagulation profile No results for input(s): INR, PROTIME in the last 168 hours.  No results for input(s): DDIMER in the last 72 hours.  Cardiac Enzymes No results for input(s): CKMB, TROPONINI, MYOGLOBIN in the last 168 hours.  Invalid input(s): CK ------------------------------------------------------------------------------------------------------------------ No results found for: BNP  Micro Results No results found for this or any previous visit (from the past 240 hour(s)).  Radiology Reports CT HEAD WO CONTRAST  Result Date: 03/06/2021 CLINICAL DATA:  Fall EXAM: CT HEAD WITHOUT CONTRAST TECHNIQUE: Contiguous axial images were obtained from the base of the skull  through the vertex without intravenous contrast. COMPARISON:  03/04/2021 FINDINGS: Brain: Unchanged appearance of right basal ganglia intraparenchymal hematoma with 7 mm of leftward midline shift. No new site of hemorrhage. Unchanged size and configuration of the ventricles. Vascular: No hyperdense vessel or unexpected calcification. Skull: Normal. Negative for fracture or focal lesion. Sinuses/Orbits: No acute finding. Other: None. IMPRESSION: Unchanged appearance of right basal ganglia intraparenchymal hematoma with 7 mm of leftward midline shift. Electronically Signed   By: Ulyses Jarred M.D.   On: 03/06/2021 03:35   CT HEAD WO CONTRAST  Result Date: 03/04/2021 CLINICAL DATA:  Follow-up intraparenchymal hemorrhage EXAM: CT HEAD WITHOUT CONTRAST TECHNIQUE: Contiguous axial images were obtained from the base of the skull through the vertex without intravenous contrast. COMPARISON:  03/03/2021 and multiple previous FINDINGS: Brain: Intraparenchymal hematoma with its epicenter in the right basal ganglia measures 5.9 x 3.8 x 4.4 cm, unchanged when measured using the same technique. Surrounding edema appears the same. Mass-effect with right-to-left midline shift of 8 mm appears the same. Compression of the right lateral ventricle. No evidence of frank ventricular trapping on the left. Elsewhere, chronic small-vessel ischemic changes are seen affecting the cerebral hemispheric white matter Vascular: There is atherosclerotic calcification of the major vessels at the base of the brain. Skull: Negative Sinuses/Orbits: Mucosal thickening and retention cysts in the maxillary sinuses. Orbits negative. Other: None IMPRESSION: No change. Intraparenchymal hematoma with its epicenter in the right basal ganglia measures 5.9 x 3.8 x 4.4  cm (volume = 52 cm^3), unchanged when measured using the same technique. Surrounding edema appears the same. Mass-effect with right-to-left midline shift of 8 mm appears the same. No evidence of  frank ventricular trapping on the left. Electronically Signed   By: Nelson Chimes M.D.   On: 03/04/2021 15:26   CT HEAD WO CONTRAST  Result Date: 03/03/2021 CLINICAL DATA:  Intracranial hemorrhage follow up EXAM: CT HEAD WITHOUT CONTRAST TECHNIQUE: Contiguous axial images were obtained from the base of the skull through the vertex without intravenous contrast. COMPARISON:  Head CT 02/22/2021 FINDINGS: Brain: Intraparenchymal hematoma has expanded to 5.8 x 3.6 cm, previously 5.5 x 2.8 cm. Leftward midline shift has also worsened and now measures 8 mm, previously 1 mm. The right lateral ventricle is effaced. Vascular: No hyperdense vessel or unexpected calcification. Skull: Normal. Negative for fracture or focal lesion. Sinuses/Orbits: Bilateral maxillary sinus mucosal thickening. Normal orbits. Other: None IMPRESSION: 1. Increased size of 5.8 x 3.6 cm right intraparenchymal hematoma with worsening leftward midline shift, now measuring 8 mm. 2. Effaced right lateral ventricle, which may become entrapped and predisposes to hydrocephalus. These results will be called to the ordering clinician or representative by the Radiologist Assistant, and communication documented in the PACS or [employer]. Electronically Signed   By: Ulyses Jarred M.D.   On: 03/03/2021 22:46   DG Chest Port 1 View  Result Date: 03/23/2021 CLINICAL DATA:  Upper respiratory infection with cough and congestion EXAM: PORTABLE CHEST 1 VIEW COMPARISON:  None. FINDINGS: Patient is rotated. The heart size and mediastinal contours are within normal limits for technique. Both lungs are clear. No pleural effusion or pneumothorax. The visualized skeletal structures are unremarkable. Surgical clips overlie the left axilla. IMPRESSION: No acute process in the chest. Electronically Signed   By: Macy Mis M.D.   On: 03/23/2021 16:04

## 2021-04-03 LAB — URINALYSIS, ROUTINE W REFLEX MICROSCOPIC
Bilirubin Urine: NEGATIVE
Glucose, UA: NEGATIVE mg/dL
Hgb urine dipstick: NEGATIVE
Ketones, ur: 5 mg/dL — AB
Leukocytes,Ua: NEGATIVE
Nitrite: NEGATIVE
Protein, ur: 100 mg/dL — AB
Specific Gravity, Urine: 1.019 (ref 1.005–1.030)
pH: 6 (ref 5.0–8.0)

## 2021-04-03 LAB — GLUCOSE, CAPILLARY
Glucose-Capillary: 83 mg/dL (ref 70–99)
Glucose-Capillary: 123 mg/dL — ABNORMAL HIGH (ref 70–99)
Glucose-Capillary: 117 mg/dL — ABNORMAL HIGH (ref 70–99)
Glucose-Capillary: 98 mg/dL (ref 70–99)

## 2021-04-03 LAB — URIC ACID: Uric Acid, Serum: 4.4 mg/dL (ref 3.7–8.6)

## 2021-04-03 LAB — BASIC METABOLIC PANEL
Anion gap: 8 (ref 5–15)
BUN: 17 mg/dL (ref 6–20)
CO2: 25 mmol/L (ref 22–32)
Calcium: 9 mg/dL (ref 8.9–10.3)
Chloride: 102 mmol/L (ref 98–111)
Creatinine, Ser: 1.08 mg/dL (ref 0.61–1.24)
GFR, Estimated: >60 mL/min (ref >60)
Glucose, Bld: 95 mg/dL (ref 70–99)
Potassium: 4.3 mmol/L (ref 3.5–5.1)
Sodium: 135 mmol/L (ref 135–145)

## 2021-04-03 LAB — CREATININE, URINE, RANDOM: Creatinine, Urine: 145.58 mg/dL

## 2021-04-03 LAB — OSMOLALITY, URINE: Osmolality, Ur: 692 mOsm/kg (ref 300–900)

## 2021-04-03 LAB — OSMOLALITY: Osmolality: 288 mOsm/kg (ref 275–295)

## 2021-04-03 LAB — SODIUM, URINE, RANDOM: Sodium, Ur: 53 mmol/L

## 2021-04-03 LAB — MAGNESIUM: Magnesium: 2.1 mg/dL (ref 1.7–2.4)

## 2021-04-03 MED ORDER — LACTATED RINGERS IV SOLN: INTRAVENOUS | Status: DC

## 2021-04-03 MED ORDER — CARVEDILOL 3.125 MG PO TABS
3.1250 mg | ORAL_TABLET | Freq: Two times a day (BID) | ORAL | Status: DC
  Administered 2021-04-03 (×2): 3.125 mg via ORAL
  Filled 2021-04-03 (×2): qty 1

## 2021-04-03 NOTE — Progress Notes (Addendum)
PROGRESS NOTE                                                                                                                                                                                                             Patient Demographics:    Chad Coleman, is a 60 y.o. male, DOB - Mar 23, 1961, TDV:[id number]  Outpatient Primary MD for the patient is Patient, No Pcp Per (Inactive)   Admit date - 02/21/2021   LOS - 46  Chief Complaint  Patient presents with  . Weakness       Brief Narrative: Patient is a 60 y.o. 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  While in the hospital-patient sustained a mechanical fall-following which we he had worsening of his intraparenchymal hematoma-requiring transfer to the ICU-neurosurgery consulted.  Upon further stability-he was transferred back to telemetry.  Further hospital course was complicated by fever-and was found to have COVID-19 infection.  COVID-19 vaccinated status: Vaccinated but not boosted.   Subjective:   Patient in bed, appears comfortable, denies any headache, no fever, no chest pain or pressure, no shortness of breath , no abdominal pain. No new focal weakness.    Assessment  & Plan :   COVID 19 infection: Now afebrile-no hypoxia-completely asymptomatic at this point-has completed 3 days of Remdesivir.  Apart from observation-no further therapy required.  He has finished his COVID-19 isolation on 04/02/2021.  ICH: Continues to have dense left-sided deficits-remains on a dysphagia 3 diet-continue PT/OT eval.  Social work following for placement.  Dysphagia: Secondary to above-has been advanced to a regular diet.  Mild transaminitis: Likely due to COVID-19-stable for monitoring.  HTN: BP low  and appears dehydrated with Hyponatremia, Hold all BP Meds (Coreg, ACE, Hydralazine) , IVF, check serum and Urine lytes.  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  Close outpatient work-up by PCP and general surgery post discharge.  GERD: Continue PPI  Cocaine/tobacco use: Counseled   DM-2 (A1c 6.4): CBG stable-on SSI and metformin  CBG (last 3)  Recent Labs  04/02/21 1742 04/02/21 2140 04/03/21 0649  GLUCAP 101* 89 83    Condition - Stable  Family Communication  : sister [name] [phone] called on 04/03/2021 message left at 9:51 AM  Code Status :  Full Code  Diet :  Diet Order            Diet regular Room service appropriate? Yes; Fluid consistency: Thin  Diet effective now                  Disposition Plan  :   Status is: Inpatient  Remains inpatient appropriate because:Inpatient level of care appropriate due to severity of illness   Dispo: The patient is from: Home              Anticipated d/c is to: SNF              Patient currently is medically stable to d/c.   Difficult to place patient No  Barriers to discharge: Awaiting SNF bed  Antimicorbials  :    Anti-infectives (From admission, onward)   Start     Dose/Rate Route Frequency Ordered Stop   03/30/21 0900  cefTRIAXone (ROCEPHIN) 1 g in sodium chloride 0.9 % 100 mL IVPB        1 g 200 mL/hr over 30 Minutes Intravenous Every 24 hours 03/30/21 0800 04/01/21 1750   03/24/21 1000  remdesivir 100 mg in sodium chloride 0.9 % 100 mL IVPB       "Followed by" Linked Group Details   100 mg 200 mL/hr over 30 Minutes Intravenous Daily 03/23/21 1302 03/25/21 1001   03/23/21 1400  remdesivir 200 mg in sodium chloride 0.9% 250 mL IVPB       "Followed by" Linked Group Details   200 mg 580 mL/hr over 30 Minutes Intravenous Once  03/23/21 1302 03/23/21 1531      Inpatient Medications  Scheduled Meds: . atorvastatin  20 mg Oral Daily  . carvedilol  3.125 mg Oral BID WC  . diclofenac Sodium  4 g Topical QID  . feeding supplement  237 mL Oral TID BM  . insulin aspart  0-5 Units Subcutaneous QHS  . insulin aspart  0-9 Units Subcutaneous TID WC  . metFORMIN  500 mg Oral Q breakfast  . multivitamin with minerals  1 tablet Oral Daily  . pantoprazole  40 mg Oral BID  . polyethylene glycol  17 g Oral Daily  . senna-docusate  1 tablet Oral BID  . sertraline  50 mg Oral Daily  . valproic acid  250 mg Oral BID   Continuous Infusions: . lactated ringers     PRN Meds:.acetaminophen **OR** [DISCONTINUED] acetaminophen (TYLENOL) oral liquid 160 mg/5 mL **OR** [DISCONTINUED] acetaminophen, alum & mag hydroxide-simeth, hydroxypropyl methylcellulose / hypromellose, melatonin, ondansetron (ZOFRAN) IV   Time Spent in minutes  15  See all Orders from today for further details   Lala Lund M.D on 04/03/2021 at 9:42 AM  To page go to [email or web address] - use universal password  [facility] -  Office  [phone]    Objective:   Vitals:   04/03/21 0051 04/03/21 0348 04/03/21 0845 04/03/21 0908  BP: (!) 132/100 93/61 99/69 105/71  Pulse: (!) 110 62 62 63  Resp: 18 18 18    Temp: 99.9 F (37.7 C) 98 F (36.7 C) 99.4 F (37.4 C)   TempSrc: Oral Oral Oral   SpO2: 99% 99% 97%   Weight:      Height:  Wt Readings from Last 3 Encounters:  02/25/21 73.2 kg  02/10/21 73.9 kg  09/30/20 78.5 kg     Intake/Output Summary (Last 24 hours) at 04/03/2021 0942 Last data filed at 04/03/2021 0656 Gross per 24 hour  Intake 360 ml  Output --  Net 360 ml     Physical Exam  Awake Alert, L. Hemiparesis  [place].AT,PERRAL Supple Neck,No JVD, No cervical lymphadenopathy appriciated.  Symmetrical Chest wall movement, Good air movement bilaterally, CTAB RRR,No Gallops, Rubs or new Murmurs, No Parasternal Heave +ve  B.Sounds, Abd Soft, No tenderness, No organomegaly appriciated, No rebound - guarding or rigidity. No Cyanosis, Clubbing or edema, No new Rash or bruise    Data Review:    CBC No results for input(s): WBC, HGB, HCT, PLT, MCV, MCH, MCHC, RDW, LYMPHSABS, MONOABS, EOSABS, BASOSABS, BANDABS in the last 168 hours.  Invalid input(s): NEUTRABS, BANDSABD  Chemistries  No results for input(s): NA, K, CL, CO2, GLUCOSE, BUN, CREATININE, CALCIUM, MG, AST, ALT, ALKPHOS, BILITOT in the last 168 hours.  Invalid input(s): GFRCGP ------------------------------------------------------------------------------------------------------------------ No results for input(s): CHOL, HDL, LDLCALC, TRIG, CHOLHDL, LDLDIRECT in the last 72 hours.  Lab Results  Component Value Date   HGBA1C 6.4 (H) 02/22/2021   ------------------------------------------------------------------------------------------------------------------ No results for input(s): TSH, T4TOTAL, T3FREE, THYROIDAB in the last 72 hours.  Invalid input(s): FREET3 ------------------------------------------------------------------------------------------------------------------ No results for input(s): VITAMINB12, FOLATE, FERRITIN, TIBC, IRON, RETICCTPCT in the last 72 hours.  Coagulation profile No results for input(s): INR, PROTIME in the last 168 hours.  No results for input(s): DDIMER in the last 72 hours.  Cardiac Enzymes No results for input(s): CKMB, TROPONINI, MYOGLOBIN in the last 168 hours.  Invalid input(s): CK ------------------------------------------------------------------------------------------------------------------ No results found for: BNP  Micro Results No results found for this or any previous visit (from the past 240 hour(s)).  Radiology Reports CT HEAD WO CONTRAST  Result Date: 03/06/2021 CLINICAL DATA:  Fall EXAM: CT HEAD WITHOUT CONTRAST TECHNIQUE: Contiguous axial images were obtained from the base of the skull  through the vertex without intravenous contrast. COMPARISON:  03/04/2021 FINDINGS: Brain: Unchanged appearance of right basal ganglia intraparenchymal hematoma with 7 mm of leftward midline shift. No new site of hemorrhage. Unchanged size and configuration of the ventricles. Vascular: No hyperdense vessel or unexpected calcification. Skull: Normal. Negative for fracture or focal lesion. Sinuses/Orbits: No acute finding. Other: None. IMPRESSION: Unchanged appearance of right basal ganglia intraparenchymal hematoma with 7 mm of leftward midline shift. Electronically Signed   By: Ulyses Jarred M.D.   On: 03/06/2021 03:35   CT HEAD WO CONTRAST  Result Date: 03/04/2021 CLINICAL DATA:  Follow-up intraparenchymal hemorrhage EXAM: CT HEAD WITHOUT CONTRAST TECHNIQUE: Contiguous axial images were obtained from the base of the skull through the vertex without intravenous contrast. COMPARISON:  03/03/2021 and multiple previous FINDINGS: Brain: Intraparenchymal hematoma with its epicenter in the right basal ganglia measures 5.9 x 3.8 x 4.4 cm, unchanged when measured using the same technique. Surrounding edema appears the same. Mass-effect with right-to-left midline shift of 8 mm appears the same. Compression of the right lateral ventricle. No evidence of frank ventricular trapping on the left. Elsewhere, chronic small-vessel ischemic changes are seen affecting the cerebral hemispheric white matter Vascular: There is atherosclerotic calcification of the major vessels at the base of the brain. Skull: Negative Sinuses/Orbits: Mucosal thickening and retention cysts in the maxillary sinuses. Orbits negative. Other: None IMPRESSION: No change. Intraparenchymal hematoma with its epicenter in the right basal ganglia measures 5.9 x 3.8 x 4.4  cm (volume = 52 cm^3), unchanged when measured using the same technique. Surrounding edema appears the same. Mass-effect with right-to-left midline shift of 8 mm appears the same. No evidence of  frank ventricular trapping on the left. Electronically Signed   By: Nelson Chimes M.D.   On: 03/04/2021 15:26   DG Chest Port 1 View  Result Date: 03/23/2021 CLINICAL DATA:  Upper respiratory infection with cough and congestion EXAM: PORTABLE CHEST 1 VIEW COMPARISON:  None. FINDINGS: Patient is rotated. The heart size and mediastinal contours are within normal limits for technique. Both lungs are clear. No pleural effusion or pneumothorax. The visualized skeletal structures are unremarkable. Surgical clips overlie the left axilla. IMPRESSION: No acute process in the chest. Electronically Signed   By: Macy Mis M.D.   On: 03/23/2021 16:04

## 2021-04-04 LAB — BASIC METABOLIC PANEL
Anion gap: 7 (ref 5–15)
BUN: 10 mg/dL (ref 6–20)
CO2: 29 mmol/L (ref 22–32)
Calcium: 9.1 mg/dL (ref 8.9–10.3)
Chloride: 101 mmol/L (ref 98–111)
Creatinine, Ser: 0.88 mg/dL (ref 0.61–1.24)
GFR, Estimated: >60 mL/min (ref >60)
Glucose, Bld: 80 mg/dL (ref 70–99)
Potassium: 4 mmol/L (ref 3.5–5.1)
Sodium: 137 mmol/L (ref 135–145)

## 2021-04-04 LAB — CBC
HCT: 39.4 % (ref 39.0–52.0)
Hemoglobin: 12.6 g/dL — ABNORMAL LOW (ref 13.0–17.0)
MCH: 28.5 pg (ref 26.0–34.0)
MCHC: 32 g/dL (ref 30.0–36.0)
MCV: 89.1 fL (ref 80.0–100.0)
Platelets: 328 10*3/uL (ref 150–400)
RBC: 4.42 MIL/uL (ref 4.22–5.81)
RDW: 12.6 % (ref 11.5–15.5)
WBC: 6.9 10*3/uL (ref 4.0–10.5)
nRBC: 0 % (ref 0.0–0.2)

## 2021-04-04 LAB — GLUCOSE, CAPILLARY
Glucose-Capillary: 91 mg/dL (ref 70–99)
Glucose-Capillary: 83 mg/dL (ref 70–99)
Glucose-Capillary: 108 mg/dL — ABNORMAL HIGH (ref 70–99)
Glucose-Capillary: 99 mg/dL (ref 70–99)

## 2021-04-04 LAB — MAGNESIUM: Magnesium: 2 mg/dL (ref 1.7–2.4)

## 2021-04-04 MED ORDER — CARVEDILOL 6.25 MG PO TABS
6.2500 mg | ORAL_TABLET | Freq: Two times a day (BID) | ORAL | Status: DC
  Administered 2021-04-04 – 2021-04-13 (×20): 6.25 mg via ORAL
  Filled 2021-04-04 (×19): qty 1

## 2021-04-04 NOTE — Progress Notes (Signed)
Physical Therapy Treatment Patient Details Name: Chad Coleman MRN: [id number] DOB: [birth year] Today's Date: 04/04/2021    History of Present Illness 60 yo male presents to [facility] on 3/28 with acute onset of L weakness, numbness, and L facial droop. CTH R BG ICH, suspect hypertensive.  Fall on 02/28/21. imaging showing increased in R intraparenchymal hematoma with worsening leftward midline shift on 4/7. Tested postive for COVID on 4/27, off precautions 5/7. PMH includes hypertension, tobacco use, noncompliance with antihypertensive medications, and cocaine abuse.    PT Comments    Pt continues to progress steadily with mobility, tolerating repeated standing tasks today as well as prolonged EOB sitting. Pt benefits from external cuing via mirror to make postural adjustments. Pt tolerating pre-gait tasks well, although pt fatigues quickly. PT to continue to follow acutely, pt remains motivated to progress mobility.    Follow Up Recommendations  Supervision/Assistance - 24 hour;SNF     Equipment Recommendations  Wheelchair (measurements PT);Wheelchair cushion (measurements PT)    Recommendations for Other Services       Precautions / Restrictions Precautions Precautions: Fall Precaution Comments: Pt with 2 in hospital falls. Pushers, mild L inattention, impulsive Restrictions Weight Bearing Restrictions: No    Mobility  Bed Mobility Overal bed mobility: Needs Assistance Bed Mobility: Supine to Sit;Sit to Supine     Supine to sit: Mod assist     General bed mobility comments: Mod assist for supine<>sit for trunk elevation via HHA, completion of LE translation to/from EOB, and positioning upon return to supine. Pt utilizing RLE under LLE to assist in lifting when moving to/from EOB.    Transfers Overall transfer level: Needs assistance Equipment used: 1 person hand held assist Transfers: Sit to/from Stand Sit to Stand: Max assist         General transfer comment: Max  assist for power up, rise, steadying, and intermittent L knee blocking. VC for upright posture, weight shifting L/R, adjusting posture to midline with assist of mirror  Ambulation/Gait             General Gait Details: pre-gait tasks only: weight shifting L and R with L knee blocked, L stepping forward and back (pt compensating with trunk flex/ext)   Stairs             Wheelchair Mobility    Modified Rankin (Stroke Patients Only) Modified Rankin (Stroke Patients Only) Pre-Morbid Rankin Score: No symptoms Modified Rankin: Severe disability     Balance Overall balance assessment: Needs assistance Sitting-balance support: Feet supported;No upper extremity supported Sitting balance-Leahy Scale: Poor Sitting balance - Comments: able to sit EOB without PT support once balanced - cues for RUE in lap to avoid pushing behaviors.   Standing balance support: During functional activity;Single extremity supported Standing balance-Leahy Scale: Poor Standing balance comment: max assist                            Cognition Arousal/Alertness: Awake/alert Behavior During Therapy: WFL for tasks assessed/performed;Flat affect Overall Cognitive Status: Impaired/Different from baseline Area of Impairment: Attention;Following commands;Safety/judgement;Problem solving                   Current Attention Level: Selective Memory: Decreased short-term memory Following Commands: Follows one step commands consistently;Follows multi-step commands inconsistently Safety/Judgement: Decreased awareness of safety Awareness: Emergent Problem Solving: Difficulty sequencing;Requires verbal cues;Slow processing        Exercises Other Exercises Other Exercises: Seated tasks: facilitation of trunk control and weight  shifting EOB, multidirectional perturbations in sitting to challenge pt balance reactions, elbow propping to upright. EOB sitting x10 minutes Other Exercises: While  standing worked on facilitation of weight shifiting and unweighting Rt foot    General Comments        Pertinent Vitals/Pain Pain Assessment: Faces Faces Pain Scale: Hurts little more Pain Location: LLE, in WB Pain Descriptors / Indicators: Grimacing;Discomfort Pain Intervention(s): Limited activity within patient's tolerance;Monitored during session;Repositioned    Home Living                      Prior Function            PT Goals (current goals can now be found in the care plan section) Acute Rehab PT Goals Patient Stated Goal: to go home PT Goal Formulation: With patient Time For Goal Achievement: 04/06/21 Potential to Achieve Goals: Good Progress towards PT goals: Progressing toward goals    Frequency    Min 3X/week      PT Plan Current plan remains appropriate    Co-evaluation              AM-PAC PT "6 Clicks" Mobility   Outcome Measure  Help needed turning from your back to your side while in a flat bed without using bedrails?: A Lot Help needed moving from lying on your back to sitting on the side of a flat bed without using bedrails?: A Lot Help needed moving to and from a bed to a chair (including a wheelchair)?: A Lot Help needed standing up from a chair using your arms (e.g., wheelchair or bedside chair)?: A Lot Help needed to walk in hospital room?: Total Help needed climbing 3-5 steps with a railing? : Total 6 Click Score: 10    End of Session Equipment Utilized During Treatment: Gait belt Activity Tolerance: Patient tolerated treatment well Patient left: in bed;with call bell/phone within reach;with bed alarm set Nurse Communication: Mobility status PT Visit Diagnosis: Hemiplegia and hemiparesis;Unsteadiness on feet (R26.81);Muscle weakness (generalized) (M62.81);Difficulty in walking, not elsewhere classified (R26.2);Other symptoms and signs involving the nervous system (R29.898) Hemiplegia - Right/Left: Left Hemiplegia -  dominant/non-dominant: Dominant Hemiplegia - caused by: Nontraumatic intracerebral hemorrhage     Time: 5974-1638 PT Time Calculation (min) (ACUTE ONLY): 37 min  Charges:  $Neuromuscular Re-education: 23-37 mins                     Stacie Glaze, PT DPT Acute Rehabilitation Services Pager [phone]  Office [phone]  [place] 04/04/2021, 3:45 PM

## 2021-04-04 NOTE — TOC Progression Note (Signed)
Transition of Care [facility]) - Progression Note    Patient Details  Name: Chad Coleman MRN: [id number] Date of Birth: April 27, 1961  Transition of Care [facility]) CM/SW Contact  Curlene Labrum, RN Phone Number: 04/04/2021, 1:32 PM  Clinical Narrative:    Case management spoke with the patient's sister, [name] on the phone and she is aware that the patient "wants to go home".  The patient's sister has encouraged the patient on the phone and he would need SNF placement.  The sister understands that patient's only offer for [facility] placement was admission to [facility] and patient would not be able to transfer to the facility until 04/13/2021 per the facility.  No other offers to SNF facilities are available since the patient does not have insurance and Medicaid is pending.  CM and MSW will continue to follow the patient for [facility] placement.   Expected Discharge Plan: [place] Barriers to Discharge: Continued Medical Work up,Inadequate or no insurance,Active Substance Use - Placement  Expected Discharge Plan and Services Expected Discharge Plan: [place] In-house Referral: Clinical Social Work,Chaplain Discharge Planning Services: CM Consult Post Acute Care Choice: [place] Living arrangements for the past 2 months: [place] Determinants of Health (SDOH) Interventions    Readmission Risk Interventions No flowsheet data found.

## 2021-04-04 NOTE — Progress Notes (Signed)
[facility] PROGRESS NOTE  Chad Coleman TFT:[id number] DOB: Apr 07, 1961 DOA: 02/21/2021 PCP: Patient, No Pcp Per (Inactive)  Status: Remains inpatient appropriate because:Altered mental status, Unsafe d/c plan and Inpatient level of care appropriate due to severity of illness   Dispo: The patient is from: Home              Anticipated d/c is to: SNF[facility] on 5/18              Patient currently is medically stable to d/c.    Difficult to place patient Yes-Medicaid pending   Level of care: Med-Surg  Code Status: Full Family Communication: 5/2 [name] [phone]. DVT prophylaxis: SCDs Vaccination status: Fully vaccinated with Moderna vaccine with second shot in August 2021-has not received a booster shot   HPI: 60 year old gentleman prior history of hypertension, cocaine use, tobacco abuse admitted on 02/21/2021 with a code stroke was found to have intracranial hemorrhage in the right basal ganglia secondary to hypertension and cocaine use.  Patient was transferred from neurology service to hospitalist service on 02/26/2021. Therapy evaluations recommending SNF.  pt had a fall on 03/03/21, he underwent CT head showing increased size of the intraparenchymal hematoma with worsening leftward midline shift, now measuring 8 mm.Effaced right lateral ventricle, which may become entrapped and predisposes to hydrocephalus. He was transferred to ICU overnight, neuro surgery consulted and a repeat CT head ordered which was essentially stable .  Pt had another fall in the room on 03/05/21, repeat CT head without contrast showed Unchanged appearance of right basal ganglia intraparenchymal hematoma with 7 mm of leftward midline shift.  Ends admission patient has done well although recommendation from PT and OT is for SNF for further treatments.  Patient stable to discharge but was found to be COVID-positive as of 4/27.  He was mildly symptomatic with fever and cough.  He has completed  3 days of remdesivir and will be eligible to come off of quarantine as of 5/7.  Please refer to note dictated on 4/27 for expanded details regarding hospitalization.  Subjective: No specific complaints.  Explained to patient physical reasons why he is unable to return home independently and why at least at this juncture his sister would not be able to manage him in her home.  He verbalized understanding.  Aware he will need to discharge to an SNF at this time.  Objective: Vitals:   04/03/21 2310 04/04/21 0325  BP: 135/81 (!) 155/98  Pulse: 67 66  Resp: 17 18  Temp: 100.2 F (37.9 C) (!) 97.4 F (36.3 C)  SpO2: 98% 99%    Intake/Output Summary (Last 24 hours) at 04/04/2021 0754 Last data filed at 04/04/2021 0006 Gross per 24 hour  Intake 1465.46 ml  Output 100 ml  Net 1365.46 ml   Filed Weights   02/25/21 1357  Weight: 73.2 kg    Exam:  Constitutional: Awaken from sleep, calm, no acute distress Cardiovascular: Normotensive, pulse regular, normal heart sound Abdomen:   LBM 5/8, soft nontender nondistended.  Eating well. Neurologic: Cranial nerves are intact without facial droop, dense left hemiplegia and unable to ambulate.  Sensation intact.  Strength 5/5 on right. Psychiatric oriented x3.  Pleasant.   Assessment/Plan: Acute problems: Mild COVID 19 infection without pneumonia Now afebrile-no hypoxia-completely asymptomatic at this point Has completed 3 days of Remdesivir.  Apart from observation-no further therapy required.   Has completed 10 days of isolation from the day of diagnosis given he did not have a severe  case of COVID and did not have pneumonia.    Hyponatremia secondary to dehydration Please secondary to recent COVID infection and variable oral intake Also associated with suboptimal blood pressure readings therefore antihypertensive medications held Sodium has normalized to 135 after administration of short-term IV fluids  Intracranial hemorrhage with  associated brain injury Zoloft 50 mg daily for brain injury related impulsivity Cognitive eval per OT/SLP with documented deficits  Proteus UTI/pansensitive Has completed 3 days of IV Rocephin  Dysphagia D3 diet as recommended by SLP  Physical Deconditioning -Secondary to recent stroke -PT/OT recommend SNF for rehabilitative therapy -Continue PRAFO shoe   Hypertension Continue carvedilol Holding hydralazine, Norvasc, clonidine, isosorbide and lisinopril secondary to recent suboptimal blood pressure readings As of 5/9 blood pressure appears to have rebounded-if readings remain elevated can reintroduce previous medication  Hyperlipidemia Continue statin.  Type 2 diabetes mellitus controlled with hyperglycemia Hemoglobin A1 c is 6.4.  Metformin and SSI     Other problems: Hypokalemia and hyponatremia Resolved  Mild elevation of liver enzymes.  Resolved Unremarkable abdominal ultrasound   Data Reviewed: Basic Metabolic Panel: Recent Labs  Lab 04/03/21 0956  NA 135  K 4.3  CL 102  CO2 25  GLUCOSE 95  BUN 17  CREATININE 1.08  CALCIUM 9.0  MG 2.1   Liver Function Tests: No results for input(s): AST, ALT, ALKPHOS, BILITOT, PROT, ALBUMIN in the last 168 hours. CBC: No results for input(s): WBC, NEUTROABS, HGB, HCT, MCV, PLT in the last 168 hours. Cardiac Enzymes: No results for input(s): CKTOTAL, CKMB, CKMBINDEX, TROPONINI in the last 168 hours.   CBG: Recent Labs  Lab 04/03/21 0649 04/03/21 1226 04/03/21 1649 04/03/21 2124 04/04/21 0636  GLUCAP 83 123* 117* 98 91       Studies: No results found.  Scheduled Meds: . atorvastatin  20 mg Oral Daily  . carvedilol  6.25 mg Oral BID WC  . diclofenac Sodium  4 g Topical QID  . feeding supplement  237 mL Oral TID BM  . insulin aspart  0-5 Units Subcutaneous QHS  . insulin aspart  0-9 Units Subcutaneous TID WC  . metFORMIN  500 mg Oral Q breakfast  . multivitamin with minerals  1 tablet Oral  Daily  . pantoprazole  40 mg Oral BID  . polyethylene glycol  17 g Oral Daily  . senna-docusate  1 tablet Oral BID  . sertraline  50 mg Oral Daily  . valproic acid  250 mg Oral BID   Continuous Infusions:   Active Problems:   Intraparenchymal hemorrhage of brain (HCC)   Hypertension   Tobacco abuse   Hyponatremia   Leukocytosis   Controlled type 2 diabetes mellitus with hyperglycemia, without long-term current use of insulin ([place])   Brain injury with brief loss of consciousness ([place])   Acute pain of right shoulder   URI with cough and congestion   Fever, unspecified   COVID-19 virus infection   Consultants:  Neurology  Neurosurgery  Procedures:  Echocardiogram  Antibiotics: Remdesivir 4/27-4/29  Time spent: 20 minutes    Erin Hearing ANP  [facility] 7 am - 330 pm/M-F for direct patient care and secure chat Please refer to Amion for contact info 42  days

## 2021-04-05 LAB — GLUCOSE, CAPILLARY
Glucose-Capillary: 89 mg/dL (ref 70–99)
Glucose-Capillary: 95 mg/dL (ref 70–99)
Glucose-Capillary: 110 mg/dL — ABNORMAL HIGH (ref 70–99)
Glucose-Capillary: 121 mg/dL — ABNORMAL HIGH (ref 70–99)

## 2021-04-05 MED ORDER — AMLODIPINE BESYLATE 10 MG PO TABS
10.0000 mg | ORAL_TABLET | Freq: Every day | ORAL | Status: DC
  Administered 2021-04-05 – 2021-04-13 (×9): 10 mg via ORAL
  Filled 2021-04-05 (×9): qty 1

## 2021-04-05 NOTE — Progress Notes (Signed)
[facility] PROGRESS NOTE  Chad Coleman ZOX:[id number] DOB: May 02, 1961 DOA: 02/21/2021 PCP: Patient, No Pcp Per (Inactive)  Status: Remains inpatient appropriate because:Altered mental status, Unsafe d/c plan and Inpatient level of care appropriate due to severity of illness   Dispo: The patient is from: Home              Anticipated d/c is to: SNF[facility] on 5/18              Patient currently is medically stable to d/c.    Difficult to place patient Yes-Medicaid pending   Level of care: Med-Surg  Code Status: Full Family Communication: 5/2 [name] [phone]. DVT prophylaxis: SCDs Vaccination status: Fully vaccinated with Moderna vaccine with second shot in August 2021-has not received a booster shot   HPI: 60 year old gentleman prior history of hypertension, cocaine use, tobacco abuse admitted on 02/21/2021 with a code stroke was found to have intracranial hemorrhage in the right basal ganglia secondary to hypertension and cocaine use.  Patient was transferred from neurology service to hospitalist service on 02/26/2021.   Pt had a fall on 03/03/21, he underwent CT head showing increased size of the intraparenchymal hematoma with worsening leftward midline shift. He was transferred to ICU overnight, neuro surgery consulted and a repeat CT head ordered which was stable .  Pt had another fall in the room on 03/05/21, repeat CT head without contrast showed unchanged appearance of right basal ganglia intraparenchymal hematoma with 7 mm of leftward midline shift.  Since admission patient has done well w/recommendation from PT and OT for SNF.  Patient stable for discharge but was found to be COVID-positive as of 4/27.  He was mildly symptomatic with fever and cough.  He has completed 3 days of remdesivir and quarantine was complete as of 5/7.  Please refer to note dictated on 4/27 for expanded details regarding hospitalization.  Subjective: Sleeping.  Easily awakened.   States appetite has improved  Objective: Vitals:   04/05/21 0317 04/05/21 0518  BP: (!) 154/95 (!) 148/94  Pulse: 62 (!) 59  Resp:    Temp: 97.8 F (36.6 C) 98.4 F (36.9 C)  SpO2: 100% 99%    Intake/Output Summary (Last 24 hours) at 04/05/2021 0756 Last data filed at 04/05/2021 0519 Gross per 24 hour  Intake 928 ml  Output 1750 ml  Net -822 ml   Filed Weights   02/25/21 1357  Weight: 73.2 kg    Exam:  Constitutional: Awakened, calm, no acute distress Cardiovascular: Normal heart sounds, skin turgor appropriate, regular pulse, no peripheral edema Abdomen:   LBM 5/8, soft nontender nondistended.  Variable oral intake Neurologic: Cranial nerves are intact without facial droop, dense left hemiplegia and unable to ambulate.  Sensation intact.  Strength 5/5 on right. Psychiatric alert and oriented x3.  Pleasant.   Assessment/Plan: Acute problems: Mild COVID 19 infection without pneumonia Now afebrile-no hypoxia-completely asymptomatic at this point Has completed 3 days of Remdesivir.  Apart from observation-no further therapy required.   Has completed 10 days of isolation from the day of diagnosis given he did not have a severe case of COVID and did not have pneumonia.    Hyponatremia secondary to dehydration Please secondary to recent COVID infection and variable oral intake Also associated with suboptimal blood pressure readings therefore antihypertensive medications held Sodium has normalized to 135 after administration of short-term IV fluids Reports anorexia since COVID which is improving.  Continues to have significant fatigue after COVID.  Intracranial hemorrhage with  associated brain injury Zoloft 50 mg daily for brain injury related impulsivity Cognitive eval per OT/SLP with documented deficits OK to go outside for therapy sessions  Proteus UTI/pansensitive Has completed 3 days of IV Rocephin  Dysphagia Has advanced to a regular diet with thin  liquids  Physical Deconditioning -Secondary to recent stroke -PT/OT recommend SNF for rehabilitative therapy -Continue PRAFO shoe   Hypertension Continue carvedilol Holding hydralazine, Norvasc, clonidine, isosorbide and lisinopril secondary to recent suboptimal blood pressure readings As of 5/9 blood pressure appears to have rebounded-if readings remain elevated can reintroduce previous medication  Hyperlipidemia Continue statin.  Type 2 diabetes mellitus controlled with hyperglycemia Hemoglobin A1 c is 6.4.  Metformin and SSI     Other problems: Hypokalemia and hyponatremia Resolved  Mild elevation of liver enzymes.  Resolved Unremarkable abdominal ultrasound   Data Reviewed: Basic Metabolic Panel: Recent Labs  Lab 04/03/21 0956 04/04/21 0728  NA 135 137  K 4.3 4.0  CL 102 101  CO2 25 29  GLUCOSE 95 80  BUN 17 10  CREATININE 1.08 0.88  CALCIUM 9.0 9.1  MG 2.1 2.0   Liver Function Tests: No results for input(s): AST, ALT, ALKPHOS, BILITOT, PROT, ALBUMIN in the last 168 hours. CBC: Recent Labs  Lab 04/04/21 0728  WBC 6.9  HGB 12.6*  HCT 39.4  MCV 89.1  PLT 328   Cardiac Enzymes: No results for input(s): CKTOTAL, CKMB, CKMBINDEX, TROPONINI in the last 168 hours.   CBG: Recent Labs  Lab 04/04/21 0636 04/04/21 1130 04/04/21 1643 04/04/21 2156 04/05/21 0635  GLUCAP 91 83 108* 99 89       Studies: No results found.  Scheduled Meds: . amLODipine  10 mg Oral Daily  . atorvastatin  20 mg Oral Daily  . carvedilol  6.25 mg Oral BID WC  . diclofenac Sodium  4 g Topical QID  . feeding supplement  237 mL Oral TID BM  . insulin aspart  0-5 Units Subcutaneous QHS  . insulin aspart  0-9 Units Subcutaneous TID WC  . metFORMIN  500 mg Oral Q breakfast  . multivitamin with minerals  1 tablet Oral Daily  . pantoprazole  40 mg Oral BID  . polyethylene glycol  17 g Oral Daily  . senna-docusate  1 tablet Oral BID  . sertraline  50 mg Oral Daily   . valproic acid  250 mg Oral BID   Continuous Infusions:   Active Problems:   Intraparenchymal hemorrhage of brain (HCC)   Hypertension   Tobacco abuse   Hyponatremia   Leukocytosis   Controlled type 2 diabetes mellitus with hyperglycemia, without long-term current use of insulin ([place])   Brain injury with brief loss of consciousness ([place])   Acute pain of right shoulder   URI with cough and congestion   Fever, unspecified   COVID-19 virus infection   Consultants:  Neurology  Neurosurgery  Procedures:  Echocardiogram  Antibiotics: Remdesivir 4/27-4/29  Time spent: 20 minutes    Erin Hearing ANP  [facility] 7 am - 330 pm/M-F for direct patient care and secure chat Please refer to Amion for contact info 43  days

## 2021-04-05 NOTE — TOC Progression Note (Signed)
Transition of Care [facility]) - Progression Note    Patient Details  Name: Chad Coleman MRN: [id number] Date of Birth: [birth year]  Transition of Care [facility]) CM/SW Contact  Curlene Labrum, RN Phone Number: 04/05/2021, 10:18 AM  Clinical Narrative:    Case management met with the patient at the bedside in regards to transitions of care to [facility] facility - pending to [facility] SNF after COVID isolation. The patient has pending bed offer at [facility] and possible transfer to the facility on 04/13/2021 - patient and family are updated and aware.   Expected Discharge Plan: [place] Barriers to Discharge: Continued Medical Work up,Inadequate or no insurance,Active Substance Use - Placement  Expected Discharge Plan and Services Expected Discharge Plan: [place] In-house Referral: Clinical Social Work,Chaplain Discharge Planning Services: CM Consult Post Acute Care Choice: [place] Living arrangements for the past 2 months: [place] Determinants of Health (SDOH) Interventions    Readmission Risk Interventions No flowsheet data found.

## 2021-04-06 LAB — GLUCOSE, CAPILLARY
Glucose-Capillary: 87 mg/dL (ref 70–99)
Glucose-Capillary: 112 mg/dL — ABNORMAL HIGH (ref 70–99)
Glucose-Capillary: 107 mg/dL — ABNORMAL HIGH (ref 70–99)
Glucose-Capillary: 146 mg/dL — ABNORMAL HIGH (ref 70–99)

## 2021-04-06 MED ORDER — HYDRALAZINE HCL 50 MG PO TABS
50.0000 mg | ORAL_TABLET | Freq: Three times a day (TID) | ORAL | Status: DC
  Administered 2021-04-06 – 2021-04-13 (×23): 50 mg via ORAL
  Filled 2021-04-06 (×23): qty 1

## 2021-04-06 NOTE — Progress Notes (Signed)
[facility] PROGRESS NOTE  Slate Debroux ZOX:[id number] DOB: 10-30-1961 DOA: 02/21/2021 PCP: Patient, No Pcp Per (Inactive)  Status: Remains inpatient appropriate because:Altered mental status, Unsafe d/c plan and Inpatient level of care appropriate due to severity of illness   Dispo: The patient is from: Home              Anticipated d/c is to: SNF-[facility] on 5/18              Patient currently is medically stable to d/c.    Difficult to place patient Yes-Medicaid pending   Level of care: Med-Surg  Code Status: Full Family Communication: 5/2 [name] [phone]. DVT prophylaxis: SCDs Vaccination status: Fully vaccinated with Moderna vaccine with second shot in August 2021-has not received a booster shot   HPI: 60 year old gentleman prior history of hypertension, cocaine use, tobacco abuse admitted on 02/21/2021 with a code stroke was found to have intracranial hemorrhage in the right basal ganglia secondary to hypertension and cocaine use.  Patient was transferred from neurology service to hospitalist service on 02/26/2021.   Pt had a fall on 03/03/21, he underwent CT head showing increased size of the intraparenchymal hematoma with worsening leftward midline shift. He was transferred to ICU overnight, neuro surgery consulted and a repeat CT head ordered which was stable .  Pt had another fall in the room on 03/05/21, repeat CT head without contrast showed unchanged appearance of right basal ganglia intraparenchymal hematoma with 7 mm of leftward midline shift.  Since admission patient has done well w/recommendation from PT and OT for SNF.  Patient stable for discharge but was found to be COVID-positive as of 4/27.  He was mildly symptomatic with fever and cough.  He has completed 3 days of remdesivir and quarantine was complete as of 5/7.  Please refer to note dictated on 4/27 for expanded details regarding hospitalization.  Subjective: Awake.  Continues to express  fatigue post COVID.  States appetite has improved.  No other specific complaints verbalized  Objective: Vitals:   04/06/21 0310 04/06/21 0716  BP: (!) 138/95 (!) 151/95  Pulse: 63 60  Resp:  17  Temp: 98.5 F (36.9 C) 98.7 F (37.1 C)  SpO2: 98% 97%    Intake/Output Summary (Last 24 hours) at 04/06/2021 5621 Last data filed at 04/06/2021 3086 Gross per 24 hour  Intake 937 ml  Output 1575 ml  Net -638 ml   Filed Weights   02/25/21 1357  Weight: 73.2 kg    Exam:  Constitutional: Weight, calm, no acute distress Cardiovascular: Normotensive, heart sounds are normal to auscultation, skin warm and dry w/ adequate capillary refill Abdomen:   LBM 5/10, soft nontender nondistended-intake remains variable Neurologic: Cranial nerves are intact, persistent dense left hemiplegia and unable to ambulate.  Sensation intact.  Strength 5/5 on right. Psychiatric alert and oriented x3.  Pleasant affect but seems a little sad today   Assessment/Plan: Acute problems: Mild COVID 19 infection without pneumonia Has completed 10 days of isolation from the day of diagnosis given he did not have a severe case of COVID and did not have pneumonia. Continues to have postinfection fatigue and some alterations in appetite  Hyponatremia secondary to dehydration Sodium has normalized to 135 after administration of short-term IV fluids and oral intake has improved  Intracranial hemorrhage with associated brain injury Zoloft 50 mg daily for brain injury related impulsivity Cognitive eval per OT/SLP with documented deficits  Proteus UTI/pansensitive Has completed 3 days of IV Rocephin  Dysphagia Has advanced to a regular diet with thin liquids  Physical Deconditioning -Secondary to recent stroke -PT/OT recommend SNF for rehabilitative therapy -Continue PRAFO shoe   Hypertension Continue carvedilol, Norvasc and hydralazine Continue to hold lisinopril (consider discontinuing since no history of  heart failure and this agent typically ineffective in African-Americans), clonidine, and isosorbide as we gradually add back antihypertensive agents  Hyperlipidemia Continue statin.  Type 2 diabetes mellitus controlled with hyperglycemia Hemoglobin A1 c is 6.4.  Metformin and SSI     Other problems: Hypokalemia and hyponatremia Resolved  Mild elevation of liver enzymes.  Resolved Unremarkable abdominal ultrasound   Data Reviewed: Basic Metabolic Panel: Recent Labs  Lab 04/03/21 0956 04/04/21 0728  NA 135 137  K 4.3 4.0  CL 102 101  CO2 25 29  GLUCOSE 95 80  BUN 17 10  CREATININE 1.08 0.88  CALCIUM 9.0 9.1  MG 2.1 2.0   Liver Function Tests: No results for input(s): AST, ALT, ALKPHOS, BILITOT, PROT, ALBUMIN in the last 168 hours. CBC: Recent Labs  Lab 04/04/21 0728  WBC 6.9  HGB 12.6*  HCT 39.4  MCV 89.1  PLT 328   Cardiac Enzymes: No results for input(s): CKTOTAL, CKMB, CKMBINDEX, TROPONINI in the last 168 hours.   CBG: Recent Labs  Lab 04/05/21 0635 04/05/21 1239 04/05/21 1701 04/05/21 2145 04/06/21 0602  GLUCAP 89 95 110* 121* 87       Studies: No results found.  Scheduled Meds: . amLODipine  10 mg Oral Daily  . atorvastatin  20 mg Oral Daily  . carvedilol  6.25 mg Oral BID WC  . diclofenac Sodium  4 g Topical QID  . feeding supplement  237 mL Oral TID BM  . hydrALAZINE  50 mg Oral Q8H  . insulin aspart  0-5 Units Subcutaneous QHS  . insulin aspart  0-9 Units Subcutaneous TID WC  . metFORMIN  500 mg Oral Q breakfast  . multivitamin with minerals  1 tablet Oral Daily  . pantoprazole  40 mg Oral BID  . polyethylene glycol  17 g Oral Daily  . senna-docusate  1 tablet Oral BID  . sertraline  50 mg Oral Daily  . valproic acid  250 mg Oral BID   Continuous Infusions:   Active Problems:   Intraparenchymal hemorrhage of brain (HCC)   Hypertension   Tobacco abuse   Hyponatremia   Leukocytosis   Controlled type 2 diabetes  mellitus with hyperglycemia, without long-term current use of insulin ([place])   Brain injury with brief loss of consciousness ([place])   Acute pain of right shoulder   URI with cough and congestion   Fever, unspecified   COVID-19 virus infection   Consultants:  Neurology  Neurosurgery  Procedures:  Echocardiogram  Antibiotics: Remdesivir 4/27-4/29  Time spent: 20 minutes    Erin Hearing ANP  [facility] 7 am - 330 pm/M-F for direct patient care and secure chat Please refer to Amion for contact info 44  days

## 2021-04-06 NOTE — Progress Notes (Signed)
Physical Therapy Treatment Patient Details Name: Chad Coleman MRN: [id number] DOB: 1961-09-15 Today's Date: 04/06/2021    History of Present Illness 60 yo male presents to [facility] on 3/28 with acute onset of L weakness, numbness, and L facial droop. CTH R BG ICH, suspect hypertensive.  Fall on 02/28/21. imaging showing increased in R intraparenchymal hematoma with worsening leftward midline shift on 4/7. Tested postive for COVID on 4/27, off precautions 5/7. PMH includes hypertension, tobacco use, noncompliance with antihypertensive medications, and cocaine abuse.    PT Comments    Pt motivated to participate in therapies today. Pt tolerating repeated sit<>stands today with stand pivot x2, overall requiring mod +2 assist to perform. Pt with difficulty moving LLE still, but pt using trunk flexion/extension to assist with LLE translation in standing tasks. Pt continues to require cues to maintain midline sitting, but is able to sit without PT/OT support for up to 1 minute at a time. PT to continue to follow .   Follow Up Recommendations  Supervision/Assistance - 24 hour;SNF     Equipment Recommendations  Wheelchair (measurements PT);Wheelchair cushion (measurements PT)    Recommendations for Other Services       Precautions / Restrictions Precautions Precautions: Fall Precaution Comments: Pt with 2 in hospital falls. Pushers, mild L inattention, impulsive Restrictions Weight Bearing Restrictions: No    Mobility  Bed Mobility Overal bed mobility: Needs Assistance Bed Mobility: Supine to Sit     Supine to sit: Mod assist;+2 for physical assistance     General bed mobility comments: patient needing assist to scoop L leg with R leg, trunk elevation and balance righting once EOB.    Transfers   Equipment used: 2 person hand held assist Transfers: Sit to/from [facility] Sit to Stand: Mod assist;+2 physical assistance Stand pivot transfers: Mod assist;+2 physical  assistance       General transfer comment: assist to stand, and cueing and assist to weight shift and move L leg during transfers. STS x3, from EOB x1 and BSC x2, SPT from bed>BSC and BSC>recliner, all transfers towards R.  Ambulation/Gait                 Stairs             Wheelchair Mobility    Modified Rankin (Stroke Patients Only) Modified Rankin (Stroke Patients Only) Pre-Morbid Rankin Score: No symptoms Modified Rankin: Severe disability     Balance Overall balance assessment: Needs assistance Sitting-balance support: Feet supported;Single extremity supported Sitting balance-Leahy Scale: Poor Sitting balance - Comments: continued cues for midline sitting, and L lateral lean at times.   Standing balance support: Bilateral upper extremity supported Standing balance-Leahy Scale: Poor Standing balance comment: needs external assist to maintain standing.                            Cognition Arousal/Alertness: Awake/alert Behavior During Therapy: WFL for tasks assessed/performed Overall Cognitive Status: Impaired/Different from baseline                     Current Attention Level: Selective Memory: Decreased short-term memory Following Commands: Follows one step commands consistently;Follows multi-step commands inconsistently Safety/Judgement: Decreased awareness of safety Awareness: Emergent Problem Solving: Requires verbal cues        Exercises      General Comments        Pertinent Vitals/Pain Pain Assessment: No/denies pain Pain Intervention(s): Monitored during session    Home Living  Prior Function            PT Goals (current goals can now be found in the care plan section) Acute Rehab PT Goals Patient Stated Goal: to go home PT Goal Formulation: With patient Time For Goal Achievement: 04/20/21 Potential to Achieve Goals: Fair Progress towards PT goals: Progressing toward goals     Frequency    Min 3X/week      PT Plan Current plan remains appropriate    Co-evaluation PT/OT/SLP Co-Evaluation/Treatment: Yes Reason for Co-Treatment: To address functional/ADL transfers;For patient/therapist safety PT goals addressed during session: Mobility/safety with mobility;Balance OT goals addressed during session: ADL's and self-care      AM-PAC PT "6 Clicks" Mobility   Outcome Measure  Help needed turning from your back to your side while in a flat bed without using bedrails?: A Lot Help needed moving from lying on your back to sitting on the side of a flat bed without using bedrails?: A Lot Help needed moving to and from a bed to a chair (including a wheelchair)?: A Lot Help needed standing up from a chair using your arms (e.g., wheelchair or bedside chair)?: A Lot Help needed to walk in hospital room?: Total Help needed climbing 3-5 steps with a railing? : Total 6 Click Score: 10    End of Session Equipment Utilized During Treatment: Gait belt Activity Tolerance: Patient tolerated treatment well Patient left: with call bell/phone within reach;in chair;with chair alarm set (with fall pad placed under pt) Nurse Communication: Mobility status;Need for lift equipment;Other (comment) (stedy vs +2 scoot pivot back to bed in drop arm recliner towards pt's R) PT Visit Diagnosis: Hemiplegia and hemiparesis;Unsteadiness on feet (R26.81);Muscle weakness (generalized) (M62.81);Difficulty in walking, not elsewhere classified (R26.2);Other symptoms and signs involving the nervous system (R29.898) Hemiplegia - Right/Left: Left Hemiplegia - dominant/non-dominant: Dominant Hemiplegia - caused by: Nontraumatic intracerebral hemorrhage     Time: 1505-1540 PT Time Calculation (min) (ACUTE ONLY): 35 min  Charges:  $Therapeutic Activity: 8-22 mins                     Stacie Glaze, PT DPT Acute Rehabilitation Services Pager [phone]  Office [phone]   Roxine Caddy E [name] 04/06/2021,  5:10 PM

## 2021-04-06 NOTE — Progress Notes (Signed)
Occupational Therapy Treatment Patient Details Name: Chad Coleman MRN: [id number] DOB: 1961-06-10 Today's Date: 04/06/2021    History of present illness 60 yo male presents to [facility] on 3/28 with acute onset of L weakness, numbness, and L facial droop. CTH R BG ICH, suspect hypertensive.  Fall on 02/28/21. imaging showing increased in R intraparenchymal hematoma with worsening leftward midline shift on 4/7. Tested postive for COVID on 4/27, off precautions 5/7. PMH includes hypertension, tobacco use, noncompliance with antihypertensive medications, and cocaine abuse.   OT comments  Patient continues to participate and follow through on teachings, but is making incremental gains towards patient focused OT goals.  The patient is needing up to Mod A of two for basic transfers, and is unable to maintain midline sitting for extended periods of time.  Patient needing Dep care for hygiene s/p BM.  Verbal cues to slow down during self feeding, clear his mouth, and take sips after bites.  SNF continues to be recommended given the level of support needed.  OT will continue to follow in the acue setting to maximize his functional status.   Follow Up Recommendations  SNF    Equipment Recommendations  Wheelchair (measurements OT);Wheelchair cushion (measurements OT)    Recommendations for Other Services      Precautions / Restrictions Precautions Precautions: Fall Precaution Comments: Pt with 2 in hospital falls. Pushers, mild L inattention, impulsive Restrictions Weight Bearing Restrictions: No       Mobility Bed Mobility Overal bed mobility: Needs Assistance Bed Mobility: Supine to Sit     Supine to sit: Mod assist;+2 for physical assistance     General bed mobility comments: patient needing assist to scoop L leg with R leg, cueing to sit up from elevated position. Patient Response: Flat affect  Transfers     Transfers: Sit to/from Stand Sit to Stand: Mod assist;+2 physical  assistance Stand pivot transfers: Mod assist;+2 physical assistance       General transfer comment: assist to stand, and cueing and assist to weight shift and move L leg during transfers.    Balance Overall balance assessment: Needs assistance Sitting-balance support: Feet supported;Single extremity supported Sitting balance-Leahy Scale: Poor Sitting balance - Comments: continued cues for midline sitting, and L lateral lean at times.   Standing balance support: Bilateral upper extremity supported Standing balance-Leahy Scale: Poor Standing balance comment: needs external assist to maintain standing.                           ADL either performed or assessed with clinical judgement   ADL   Eating/Feeding: Supervision/ safety;Set up;Sitting                       Toilet Transfer: Moderate assistance;+2 for safety/equipment;+2 for physical assistance;Stand-pivot;BSC   Toileting- Clothing Manipulation and Hygiene: Total assistance;Sit to/from stand       Functional mobility during ADLs: Moderate assistance;+2 for physical assistance;+2 for safety/equipment General ADL Comments: pt limited by L sided weakness, impaired balance and cognition                       Cognition Arousal/Alertness: Awake/alert Behavior During Therapy: WFL for tasks assessed/performed Overall Cognitive Status: Impaired/Different from baseline                       Memory: Decreased short-term memory Following Commands: Follows one step commands consistently;Follows multi-step commands inconsistently Safety/Judgement:  Decreased awareness of safety   Problem Solving: Requires verbal cues                            Pertinent Vitals/ Pain       Pain Assessment: No/denies pain                                                          Frequency  Min 2X/week        Progress Toward Goals  OT Goals(current goals can now be  found in the care plan section)  Progress towards OT goals: Progressing toward goals  Acute Rehab OT Goals Patient Stated Goal: Return home OT Goal Formulation: With patient Time For Goal Achievement: 04/13/21 Potential to Achieve Goals: [place] Discharge plan remains appropriate    Co-evaluation    PT/OT/SLP Co-Evaluation/Treatment: Yes Reason for Co-Treatment: Complexity of the patient's impairments (multi-system involvement);For patient/therapist safety   OT goals addressed during session: ADL's and self-care      AM-PAC OT "6 Clicks" Daily Activity     Outcome Measure   Help from another person eating meals?: A Little Help from another person taking care of personal grooming?: A Little Help from another person toileting, which includes using toliet, bedpan, or urinal?: A Lot Help from another person bathing (including washing, rinsing, drying)?: A Lot Help from another person to put on and taking off regular upper body clothing?: A Lot Help from another person to put on and taking off regular lower body clothing?: A Lot 6 Click Score: 14    End of Session Equipment Utilized During Treatment: Gait belt  OT Visit Diagnosis: Unsteadiness on feet (R26.81);Other abnormalities of gait and mobility (R26.89);Muscle weakness (generalized) (M62.81);Hemiplegia and hemiparesis;Pain Hemiplegia - Right/Left: Left Hemiplegia - dominant/non-dominant: Dominant Hemiplegia - caused by: Cerebral infarction   Activity Tolerance Patient tolerated treatment well   Patient Left in chair;with call bell/phone within reach;with chair alarm set   Nurse Communication Mobility status;Need for lift equipment        Time: 3382-5053 OT Time Calculation (min): 30 min  Charges: OT General Charges $OT Visit: 1 Visit OT Treatments $Self Care/Home Management : 8-22 mins  04/06/2021  Rich, OTR/L  Acute Rehabilitation Services  Office:  [place] 04/06/2021,  4:10 PM

## 2021-04-07 LAB — GLUCOSE, CAPILLARY
Glucose-Capillary: 163 mg/dL — ABNORMAL HIGH (ref 70–99)
Glucose-Capillary: 117 mg/dL — ABNORMAL HIGH (ref 70–99)
Glucose-Capillary: 106 mg/dL — ABNORMAL HIGH (ref 70–99)
Glucose-Capillary: 108 mg/dL — ABNORMAL HIGH (ref 70–99)

## 2021-04-07 NOTE — Progress Notes (Signed)
[facility] PROGRESS NOTE  Ferron Ishmael OEU:[id number] DOB: 07-19-1961 DOA: 02/21/2021 PCP: Patient, No Pcp Per (Inactive)  Status: Remains inpatient appropriate because:Altered mental status, Unsafe d/c plan and Inpatient level of care appropriate due to severity of illness   Dispo: The patient is from: Home              Anticipated d/c is to: SNF[facility] on 5/18              Patient currently is medically stable to d/c.    Difficult to place patient Yes-Medicaid pending   Level of care: Med-Surg  Code Status: Full Family Communication: 5/2 [name] [phone]. DVT prophylaxis: SCDs Vaccination status: Fully vaccinated with Moderna vaccine with second shot in August 2021-has not received a booster shot   HPI: 60 year old gentleman prior history of hypertension, cocaine use, tobacco abuse admitted on 02/21/2021 with a code stroke was found to have intracranial hemorrhage in the right basal ganglia secondary to hypertension and cocaine use.  Patient was transferred from neurology service to hospitalist service on 02/26/2021.   Pt had a fall on 03/03/21, he underwent CT head showing increased size of the intraparenchymal hematoma with worsening leftward midline shift. He was transferred to ICU overnight, neuro surgery consulted and a repeat CT head ordered which was stable .  Pt had another fall in the room on 03/05/21, repeat CT head without contrast showed unchanged appearance of right basal ganglia intraparenchymal hematoma with 7 mm of leftward midline shift.  Since admission patient has done well w/recommendation from PT and OT for SNF.  Patient stable for discharge but was found to be COVID-positive as of 4/27.  He was mildly symptomatic with fever and cough.  He has completed 3 days of remdesivir and quarantine was complete as of 5/7.  Please refer to note dictated on 4/27 for expanded details regarding hospitalization.  Subjective: Awakened.  Pleasant.  Still is  having some post-COVID fatigue and lethargy.  Objective: Vitals:   04/07/21 0333 04/07/21 0810  BP: 119/74 109/75  Pulse: 60 61  Resp: 19 18  Temp: 98.1 F (36.7 C) 98.4 F (36.9 C)  SpO2: 100% 98%    Intake/Output Summary (Last 24 hours) at 04/07/2021 0843 Last data filed at 04/07/2021 0720 Gross per 24 hour  Intake 240 ml  Output 1200 ml  Net -960 ml   Filed Weights   02/25/21 1357  Weight: 73.2 kg    Exam:  Constitutional: Awake, calm, no acute distress Respiratory: Lungs are clear anteriorly, patient with dry nonproductive cough intermittently, no increased work of breathing and remained stable on room air. Cardiovascular: Normal heart sounds, regular pulse, appropriate skin turgor Abdomen:   LBM 5/10, soft nontender nondistended with variable oral intake.  Normoactive bowel sounds Neurologic: Cranial nerves are intact, persistent dense left hemiplegia and unable to ambulate. Strength on right WNL Psychiatric awake and oriented x3.  Pleasant affect.   Assessment/Plan: Acute problems: Mild COVID 19 infection without pneumonia Has completed 10 days of isolation from the day of diagnosis given he did not have a severe case of COVID and did not have pneumonia. Continues to have postinfection fatigue and some alterations in appetite  Hyponatremia secondary to dehydration Sodium has normalized to 135 after administration of short-term IV fluids and oral intake/BP improved  Intracranial hemorrhage with associated brain injury Zoloft 50 mg daily for brain injury related impulsivity Cognitive eval per OT/SLP with documented deficits  Proteus UTI/pansensitive Has completed 3 days of IV  Rocephin  Dysphagia Has advanced to a regular diet with thin liquids  Physical Deconditioning -Secondary to recent stroke -PT/OT recommend SNF for rehabilitative therapy -Continue PRAFO shoe   Hypertension Continue carvedilol, Norvasc and hydralazine Continue to hold lisinopril  (consider discontinuing since no history of heart failure and this agent typically ineffective in African-Americans), clonidine, and isosorbide as we gradually add back antihypertensive agents  Hyperlipidemia Continue statin.  Type 2 diabetes mellitus controlled with hyperglycemia Hemoglobin A1 c is 6.4.  Metformin and SSI     Other problems: Hypokalemia and hyponatremia Resolved  Mild elevation of liver enzymes.  Resolved Unremarkable abdominal ultrasound   Data Reviewed: Basic Metabolic Panel: Recent Labs  Lab 04/03/21 0956 04/04/21 0728  NA 135 137  K 4.3 4.0  CL 102 101  CO2 25 29  GLUCOSE 95 80  BUN 17 10  CREATININE 1.08 0.88  CALCIUM 9.0 9.1  MG 2.1 2.0   Liver Function Tests: No results for input(s): AST, ALT, ALKPHOS, BILITOT, PROT, ALBUMIN in the last 168 hours. CBC: Recent Labs  Lab 04/04/21 0728  WBC 6.9  HGB 12.6*  HCT 39.4  MCV 89.1  PLT 328   Cardiac Enzymes: No results for input(s): CKTOTAL, CKMB, CKMBINDEX, TROPONINI in the last 168 hours.   CBG: Recent Labs  Lab 04/06/21 0602 04/06/21 1207 04/06/21 1618 04/06/21 2137 04/07/21 0703  GLUCAP 87 112* 107* 146* 163*       Studies: No results found.  Scheduled Meds: . amLODipine  10 mg Oral Daily  . atorvastatin  20 mg Oral Daily  . carvedilol  6.25 mg Oral BID WC  . diclofenac Sodium  4 g Topical QID  . feeding supplement  237 mL Oral TID BM  . hydrALAZINE  50 mg Oral Q8H  . insulin aspart  0-5 Units Subcutaneous QHS  . insulin aspart  0-9 Units Subcutaneous TID WC  . metFORMIN  500 mg Oral Q breakfast  . multivitamin with minerals  1 tablet Oral Daily  . pantoprazole  40 mg Oral BID  . polyethylene glycol  17 g Oral Daily  . senna-docusate  1 tablet Oral BID  . sertraline  50 mg Oral Daily  . valproic acid  250 mg Oral BID   Continuous Infusions:   Active Problems:   Intraparenchymal hemorrhage of brain (HCC)   Hypertension   Tobacco abuse   Hyponatremia    Leukocytosis   Controlled type 2 diabetes mellitus with hyperglycemia, without long-term current use of insulin ([place])   Brain injury with brief loss of consciousness ([place])   Acute pain of right shoulder   URI with cough and congestion   Fever, unspecified   COVID-19 virus infection   Consultants:  Neurology  Neurosurgery  Procedures:  Echocardiogram  Antibiotics: Remdesivir 4/27-4/29  Time spent: 20 minutes    Erin Hearing ANP  [facility] 7 am - 330 pm/M-F for direct patient care and secure chat Please refer to Amion for contact info 45  days

## 2021-04-08 ENCOUNTER — Encounter (HOSPITAL_COMMUNITY): Payer: Self-pay | Admitting: Neurology

## 2021-04-08 LAB — GLUCOSE, CAPILLARY
Glucose-Capillary: 82 mg/dL (ref 70–99)
Glucose-Capillary: 122 mg/dL — ABNORMAL HIGH (ref 70–99)
Glucose-Capillary: 119 mg/dL — ABNORMAL HIGH (ref 70–99)
Glucose-Capillary: 100 mg/dL — ABNORMAL HIGH (ref 70–99)

## 2021-04-08 NOTE — Progress Notes (Signed)
  Speech Language Pathology Treatment: Cognitive-Linquistic  Patient Details Name: Chad Coleman MRN: [id number] DOB: July 01, 1961 Today's Date: 04/08/2021 Time: 2951-8841 SLP Time Calculation (min) (ACUTE ONLY): 13 min  Assessment / Plan / Recommendation Clinical Impression  Pt has made improvements in cognition.  Demonstrates improved selective attention, improved short-term recall: Recalled events and visitors from today; had difficulty recalling names of staff but able to describe appearance. Retained ph # for kitchen in working memory as he dialed number. Required min verbal cues to problem-solve and sequence steps required to use phone (establishing dial tone, then entering #). Continues with flat affect. Verbal cues needed for emergent awareness.   SLP will continue to follow.   HPI HPI: 60 y.o. male with a medical history significant for hypertension, tobacco use, noncompliance with antihypertensive medications, and cocaine abuse who presented to the ED via EMS as a Code Stroke for evaluation of acute onset of left-sided weakness, numbness, and left facial droop. Dx right basal ganglia ICH. Evening of 4/7 pt fell from bed with CT revealing Increased size of 5.8 x 3.6 cm right intraparenchymal hematoma  with worsening leftward midline shift and eval. ST f/u for cognition/diet tolerance      SLP Plan  Continue with current plan of care       Recommendations                   Oral Care Recommendations: Oral care BID Follow up Recommendations: Skilled Nursing facility SLP Visit Diagnosis: Cognitive communication deficit (Y60.630) Plan: Continue with current plan of care       GO                Chad Coleman Chad Coleman 04/08/2021, 3:34 PM  Chad Coleman L. Chad Coleman, [place] Office number [phone] Pager [phone]

## 2021-04-08 NOTE — Progress Notes (Signed)
Physical Therapy Treatment Patient Details Name: Chad Coleman MRN: [id number] DOB: [birth year] Today's Date: 04/08/2021    History of Present Illness 60 yo male presents to [facility] on 3/28 with acute onset of L weakness, numbness, and L facial droop. CTH R BG ICH, suspect hypertensive.  Fall on 02/28/21. imaging showing increased in R intraparenchymal hematoma with worsening leftward midline shift on 4/7. Tested postive for COVID on 4/27, off precautions 5/7. PMH includes hypertension, tobacco use, noncompliance with antihypertensive medications, and cocaine abuse.    PT Comments    Patient initially not agreeable to therapy, with encouragement patient agreeable. Patient performed sit to stand x 5 with mod-minA+2 and HHAx2. Patient performed stand pivot transfer with HHAx2 and modA+2 with difficulty moving L LE due to weakness. Continue to recommend SNF for ongoing Physical Therapy.       Follow Up Recommendations  Supervision/Assistance - 24 hour;SNF     Equipment Recommendations  Wheelchair (measurements PT);Wheelchair cushion (measurements PT)    Recommendations for Other Services       Precautions / Restrictions Precautions Precautions: Fall Precaution Comments: Pt with 2 in hospital falls. Pushers, mild L inattention, impulsive Restrictions Weight Bearing Restrictions: No    Mobility  Bed Mobility Overal bed mobility: Needs Assistance Bed Mobility: Supine to Sit     Supine to sit: Mod assist     General bed mobility comments: modA for bringing LEs off and trunk elevation. Patient initiated using R LE to bring L leg off bed but unable to successfully complete.    Transfers Overall transfer level: Needs assistance Equipment used: 2 person hand held assist Transfers: Sit to/from [facility] Sit to Stand: Mod assist;Min assist;+2 physical assistance Stand pivot transfers: Mod assist;+2 physical assistance       General transfer comment: modA+2 to  perform sit to stand initially but after subsequent trials, patient requiring minA+2. ModA+2 to complet stand pivot with patient unable to step with L LE  Ambulation/Gait                 Stairs             Wheelchair Mobility    Modified Rankin (Stroke Patients Only) Modified Rankin (Stroke Patients Only) Pre-Morbid Rankin Score: No symptoms Modified Rankin: Severe disability     Balance Overall balance assessment: Needs assistance Sitting-balance support: Feet supported;Single extremity supported Sitting balance-Leahy Scale: Poor Sitting balance - Comments: cues for maintaining midline   Standing balance support: Bilateral upper extremity supported Standing balance-Leahy Scale: Poor Standing balance comment: requires external assist to maintain standing                            Cognition Arousal/Alertness: Awake/alert Behavior During Therapy: WFL for tasks assessed/performed Overall Cognitive Status: Impaired/Different from baseline Area of Impairment: Attention;Following commands;Safety/judgement;Problem solving;Awareness                   Current Attention Level: Selective Memory: Decreased short-term memory Following Commands: Follows one step commands consistently;Follows multi-step commands inconsistently Safety/Judgement: Decreased awareness of safety Awareness: Emergent Problem Solving: Requires verbal cues General Comments: improved ability to follow commands and improved safety awareness      Exercises      General Comments        Pertinent Vitals/Pain Pain Assessment: No/denies pain    Home Living  Prior Function            PT Goals (current goals can now be found in the care plan section) Acute Rehab PT Goals Patient Stated Goal: to go home PT Goal Formulation: With patient Time For Goal Achievement: 04/20/21 Potential to Achieve Goals: Fair Progress towards PT goals: Progressing  toward goals    Frequency    Min 3X/week      PT Plan Current plan remains appropriate    Co-evaluation              AM-PAC PT "6 Clicks" Mobility   Outcome Measure  Help needed turning from your back to your side while in a flat bed without using bedrails?: A Lot Help needed moving from lying on your back to sitting on the side of a flat bed without using bedrails?: A Lot Help needed moving to and from a bed to a chair (including a wheelchair)?: A Lot Help needed standing up from a chair using your arms (e.g., wheelchair or bedside chair)?: A Lot Help needed to walk in hospital room?: Total Help needed climbing 3-5 steps with a railing? : Total 6 Click Score: 10    End of Session Equipment Utilized During Treatment: Gait belt Activity Tolerance: Patient tolerated treatment well Patient left: in chair;with call bell/phone within reach;with chair alarm set Nurse Communication: Mobility status;Need for lift equipment PT Visit Diagnosis: Hemiplegia and hemiparesis;Unsteadiness on feet (R26.81);Muscle weakness (generalized) (M62.81);Difficulty in walking, not elsewhere classified (R26.2);Other symptoms and signs involving the nervous system (R29.898) Hemiplegia - Right/Left: Left Hemiplegia - dominant/non-dominant: Dominant Hemiplegia - caused by: Nontraumatic intracerebral hemorrhage     Time: 1152-1208 PT Time Calculation (min) (ACUTE ONLY): 16 min  Charges:  $Therapeutic Activity: 8-22 mins                     Alvin Diffee A. Gilford Rile PT, DPT Acute Rehabilitation Services Pager [phone] Office [phone]    Linna Hoff 04/08/2021, 2:45 PM

## 2021-04-08 NOTE — Progress Notes (Addendum)
PROGRESS NOTE                                                                                                                                                                                                             Patient Demographics:    Chad Coleman, is a 60 y.o. male, DOB - 1960/12/04, GUR:[id number]  Outpatient Primary MD for the patient is Patient, No Pcp Per (Inactive)   Admit date - 02/21/2021   LOS - 30  Chief Complaint  Patient presents with  . Weakness       Brief Narrative: Patient is a 60 y.o. 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  While in the hospital-patient sustained a mechanical fall-following which we he had worsening of his intraparenchymal hematoma-requiring transfer to the ICU-neurosurgery consulted.  Upon further stability-he was transferred back to telemetry.  Further hospital course was complicated by fever-and was found to have COVID-19 infection.  COVID-19 vaccinated status: Vaccinated but not boosted.   Subjective:   Patient in bed, appears comfortable, denies any headache, no fever, no chest pain or pressure, no shortness of breath , no abdominal pain. No new focal weakness, +ve L. Sided weakness.   Assessment  & Plan :   COVID 19 infection: Now afebrile-no hypoxia-completely asymptomatic at this point-has completed 3 days of Remdesivir.  Apart from observation-no further therapy required.  He has finished his COVID-19 isolation on 04/02/2021.  ICH: Continues to have dense left-sided deficits-remains on a dysphagia 3 diet-continue PT/OT eval.  Social work following for placement.  Dysphagia: Secondary to above- resolved, on regular diet.  Mild transaminitis: Likely due to COVID-19-stable for  monitoring.  HTN: BP stable on present regimen of Norvasc, Coreg and hydralazine.  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  Close outpatient work-up by PCP and general surgery post discharge.  GERD: Continue PPI  Cocaine/tobacco use: Counseled  DM-2 (A1c 6.4): CBG stable-on SSI and metformin  CBG (last 3)  Recent Labs    04/07/21 1643 04/07/21 2116 04/08/21 0650  GLUCAP 106*  108* 82    Condition - Stable  Family Communication  : sister [name] [phone] called on 04/03/2021 message left at 9:51 AM  Code Status :  Full Code  DVT prophylaxis - SCDs due to [place]  Diet :  Diet Order            Diet regular Room service appropriate? Yes; Fluid consistency: Thin  Diet effective now                  Disposition Plan  :   Status is: Inpatient  Remains inpatient appropriate because:Inpatient level of care appropriate due to severity of illness   Dispo: The patient is from: Home              Anticipated d/c is to: SNF              Patient currently is medically stable to d/c.   Difficult to place patient No  Barriers to discharge: Awaiting SNF bed  Antimicorbials  :    Anti-infectives (From admission, onward)   Start     Dose/Rate Route Frequency Ordered Stop   03/30/21 0900  cefTRIAXone (ROCEPHIN) 1 g in sodium chloride 0.9 % 100 mL IVPB        1 g 200 mL/hr over 30 Minutes Intravenous Every 24 hours 03/30/21 0800 04/01/21 1750   03/24/21 1000  remdesivir 100 mg in sodium chloride 0.9 % 100 mL IVPB       "Followed by" Linked Group Details   100 mg 200 mL/hr over 30 Minutes Intravenous Daily 03/23/21 1302 03/25/21 1001   03/23/21 1400  remdesivir 200 mg in sodium chloride 0.9% 250 mL IVPB       "Followed by" Linked Group Details   200 mg 580 mL/hr over 30 Minutes Intravenous Once  03/23/21 1302 03/23/21 1531      Inpatient Medications  Scheduled Meds: . amLODipine  10 mg Oral Daily  . atorvastatin  20 mg Oral Daily  . carvedilol  6.25 mg Oral BID WC  . diclofenac Sodium  4 g Topical QID  . feeding supplement  237 mL Oral TID BM  . hydrALAZINE  50 mg Oral Q8H  . insulin aspart  0-5 Units Subcutaneous QHS  . insulin aspart  0-9 Units Subcutaneous TID WC  . metFORMIN  500 mg Oral Q breakfast  . multivitamin with minerals  1 tablet Oral Daily  . pantoprazole  40 mg Oral BID  . polyethylene glycol  17 g Oral Daily  . senna-docusate  1 tablet Oral BID  . sertraline  50 mg Oral Daily  . valproic acid  250 mg Oral BID   Continuous Infusions:  PRN Meds:.acetaminophen **OR** [DISCONTINUED] acetaminophen (TYLENOL) oral liquid 160 mg/5 mL **OR** [DISCONTINUED] acetaminophen, alum & mag hydroxide-simeth, hydroxypropyl methylcellulose / hypromellose, melatonin, ondansetron (ZOFRAN) IV   Time Spent in minutes  15  See all Orders from today for further details   Lala Lund M.D on 04/08/2021 at 10:49 AM  To page go to [email or web address] - use universal password  [facility] -  Office  [phone]    Objective:   Vitals:   04/07/21 1932 04/08/21 0019 04/08/21 0316 04/08/21 0758  BP: 120/81 126/79 121/79 122/81  Pulse: 68 71 62 64  Resp: 18 18 18 14   Temp: 98.4 F (36.9 C) 98 F (36.7 C) 97.6 F (36.4 C) 98.1 F (36.7 C)  TempSrc: Oral Oral Oral Oral  SpO2: 98% 98% 97% 98%  Weight:  Height:        Wt Readings from Last 3 Encounters:  02/25/21 73.2 kg  02/10/21 73.9 kg  09/30/20 78.5 kg     Intake/Output Summary (Last 24 hours) at 04/08/2021 1049 Last data filed at 04/08/2021 0539 Gross per 24 hour  Intake --  Output 1001 ml  Net -1001 ml     Physical Exam  Awake Alert, L. Hemiparesis  [place].AT,PERRAL Supple Neck,No JVD, No cervical lymphadenopathy appriciated.  Symmetrical Chest wall movement, Good air movement bilaterally,  CTAB RRR,No Gallops, Rubs or new Murmurs, No Parasternal Heave +ve B.Sounds, Abd Soft, No tenderness, No organomegaly appriciated, No rebound - guarding or rigidity. No Cyanosis, Clubbing or edema, No new Rash or bruise    Data Review:    CBC Recent Labs  Lab 04/04/21 0728  WBC 6.9  HGB 12.6*  HCT 39.4  PLT 328  MCV 89.1  MCH 28.5  MCHC 32.0  RDW 12.6    Chemistries  Recent Labs  Lab 04/03/21 0956 04/04/21 0728  NA 135 137  K 4.3 4.0  CL 102 101  CO2 25 29  GLUCOSE 95 80  BUN 17 10  CREATININE 1.08 0.88  CALCIUM 9.0 9.1  MG 2.1 2.0   ------------------------------------------------------------------------------------------------------------------ No results for input(s): CHOL, HDL, LDLCALC, TRIG, CHOLHDL, LDLDIRECT in the last 72 hours.  Lab Results  Component Value Date   HGBA1C 6.4 (H) 02/22/2021   ------------------------------------------------------------------------------------------------------------------ No results for input(s): TSH, T4TOTAL, T3FREE, THYROIDAB in the last 72 hours.  Invalid input(s): FREET3 ------------------------------------------------------------------------------------------------------------------ No results for input(s): VITAMINB12, FOLATE, FERRITIN, TIBC, IRON, RETICCTPCT in the last 72 hours.  Coagulation profile No results for input(s): INR, PROTIME in the last 168 hours.  No results for input(s): DDIMER in the last 72 hours.  Cardiac Enzymes No results for input(s): CKMB, TROPONINI, MYOGLOBIN in the last 168 hours.  Invalid input(s): CK ------------------------------------------------------------------------------------------------------------------ No results found for: BNP  Micro Results No results found for this or any previous visit (from the past 240 hour(s)).  Radiology Reports DG Chest Port 1 View  Result Date: 03/23/2021 CLINICAL DATA:  Upper respiratory infection with cough and congestion EXAM:  PORTABLE CHEST 1 VIEW COMPARISON:  None. FINDINGS: Patient is rotated. The heart size and mediastinal contours are within normal limits for technique. Both lungs are clear. No pleural effusion or pneumothorax. The visualized skeletal structures are unremarkable. Surgical clips overlie the left axilla. IMPRESSION: No acute process in the chest. Electronically Signed   By: Macy Mis M.D.   On: 03/23/2021 16:04

## 2021-04-09 LAB — GLUCOSE, CAPILLARY
Glucose-Capillary: 70 mg/dL (ref 70–99)
Glucose-Capillary: 92 mg/dL (ref 70–99)
Glucose-Capillary: 130 mg/dL — ABNORMAL HIGH (ref 70–99)
Glucose-Capillary: 103 mg/dL — ABNORMAL HIGH (ref 70–99)

## 2021-04-09 NOTE — Progress Notes (Signed)
PROGRESS NOTE                                                                                                                                                                                                             Patient Demographics:    Chad Coleman, is a 60 y.o. male, DOB - February 25, 1961, EGB:[id number]  Outpatient Primary MD for the patient is Patient, No Pcp Per (Inactive)   Admit date - 02/21/2021   LOS - 63  Chief Complaint  Patient presents with  . Weakness       Brief Narrative: Patient is a 60 y.o. 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  While in the hospital-patient sustained a mechanical fall-following which we he had worsening of his intraparenchymal hematoma-requiring transfer to the ICU-neurosurgery consulted.  Upon further stability-he was transferred back to telemetry.  Further hospital course was complicated by fever-and was found to have COVID-19 infection.  COVID-19 vaccinated status: Vaccinated but not boosted.   Subjective:   Patient in bed, appears comfortable, denies any headache, no fever, no chest pain or pressure, no shortness of breath , no abdominal pain, +ve L. Sided weakness.   Assessment  & Plan :   COVID 19 infection: Now afebrile-no hypoxia-completely asymptomatic at this point-has completed 3 days of Remdesivir.  Apart from observation-no further therapy required.  He has finished his COVID-19 isolation on 04/02/2021.  ICH: Continues to have dense left-sided deficits-remains on a dysphagia 3 diet-continue PT/OT eval.  Social work following for placement.  Dysphagia: Secondary to above- resolved, on regular diet.  Mild transaminitis: Likely due to COVID-19-stable for monitoring.  HTN: BP stable on  present regimen of Norvasc, Coreg and hydralazine.  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  Close outpatient work-up by PCP and general surgery post discharge.  GERD: Continue PPI  Cocaine/tobacco use: Counseled  DM-2 (A1c 6.4): CBG stable-on SSI and metformin  CBG (last 3)  Recent Labs    04/08/21 1612 04/08/21 2108 04/09/21 0604  GLUCAP 119* 100* 70  Condition - Stable  Family Communication  : sister [name] [phone] called on 04/03/2021 message left at 9:51 AM  Code Status :  Full Code  DVT prophylaxis - SCDs due to [place]  Diet :  Diet Order            Diet regular Room service appropriate? Yes; Fluid consistency: Thin  Diet effective now                  Disposition Plan  :   Status is: Inpatient  Remains inpatient appropriate because:Inpatient level of care appropriate due to severity of illness   Dispo: The patient is from: Home              Anticipated d/c is to: SNF              Patient currently is medically stable to d/c.   Difficult to place patient No  Barriers to discharge: Awaiting SNF bed  Antimicorbials  :    Anti-infectives (From admission, onward)   Start     Dose/Rate Route Frequency Ordered Stop   03/30/21 0900  cefTRIAXone (ROCEPHIN) 1 g in sodium chloride 0.9 % 100 mL IVPB        1 g 200 mL/hr over 30 Minutes Intravenous Every 24 hours 03/30/21 0800 04/01/21 1750   03/24/21 1000  remdesivir 100 mg in sodium chloride 0.9 % 100 mL IVPB       "Followed by" Linked Group Details   100 mg 200 mL/hr over 30 Minutes Intravenous Daily 03/23/21 1302 03/25/21 1001   03/23/21 1400  remdesivir 200 mg in sodium chloride 0.9% 250 mL IVPB       "Followed by" Linked Group Details   200 mg 580 mL/hr over 30 Minutes Intravenous Once 03/23/21 1302 03/23/21 1531       Inpatient Medications  Scheduled Meds: . amLODipine  10 mg Oral Daily  . atorvastatin  20 mg Oral Daily  . carvedilol  6.25 mg Oral BID WC  . diclofenac Sodium  4 g Topical QID  . feeding supplement  237 mL Oral TID BM  . hydrALAZINE  50 mg Oral Q8H  . insulin aspart  0-5 Units Subcutaneous QHS  . insulin aspart  0-9 Units Subcutaneous TID WC  . metFORMIN  500 mg Oral Q breakfast  . multivitamin with minerals  1 tablet Oral Daily  . pantoprazole  40 mg Oral BID  . polyethylene glycol  17 g Oral Daily  . senna-docusate  1 tablet Oral BID  . sertraline  50 mg Oral Daily  . valproic acid  250 mg Oral BID   Continuous Infusions:  PRN Meds:.acetaminophen **OR** [DISCONTINUED] acetaminophen (TYLENOL) oral liquid 160 mg/5 mL **OR** [DISCONTINUED] acetaminophen, alum & mag hydroxide-simeth, hydroxypropyl methylcellulose / hypromellose, melatonin, ondansetron (ZOFRAN) IV   Time Spent in minutes  15  See all Orders from today for further details   Lala Lund M.D on 04/09/2021 at 11:04 AM  To page go to [email or web address] - use universal password  [facility] -  Office  [phone]    Objective:   Vitals:   04/08/21 1948 04/08/21 2334 04/09/21 0327 04/09/21 0805  BP: 127/83 128/81 123/83 117/74  Pulse: 71 69 65 67  Resp: 14 14 14 18   Temp: 99.2 F (37.3 C) 98.9 F (37.2 C) 98.5 F (36.9 C) 98.5 F (36.9 C)  TempSrc: Oral Oral Oral Oral  SpO2: 98% 98% 99% 95%  Weight:      Height:  Wt Readings from Last 3 Encounters:  02/25/21 73.2 kg  02/10/21 73.9 kg  09/30/20 78.5 kg     Intake/Output Summary (Last 24 hours) at 04/09/2021 1104 Last data filed at 04/09/2021 6269 Gross per 24 hour  Intake --  Output 1350 ml  Net -1350 ml     Physical Exam  Awake Alert, L. Hemiparesis  [place].AT,PERRAL Supple Neck,No JVD, No cervical lymphadenopathy appriciated.  Symmetrical Chest wall movement, Good air movement bilaterally, CTAB RRR,No Gallops, Rubs or new  Murmurs, No Parasternal Heave +ve B.Sounds, Abd Soft, No tenderness, No organomegaly appriciated, No rebound - guarding or rigidity. No Cyanosis, Clubbing or edema, No new Rash or bruise    Data Review:    CBC Recent Labs  Lab 04/04/21 0728  WBC 6.9  HGB 12.6*  HCT 39.4  PLT 328  MCV 89.1  MCH 28.5  MCHC 32.0  RDW 12.6    Chemistries  Recent Labs  Lab 04/03/21 0956 04/04/21 0728  NA 135 137  K 4.3 4.0  CL 102 101  CO2 25 29  GLUCOSE 95 80  BUN 17 10  CREATININE 1.08 0.88  CALCIUM 9.0 9.1  MG 2.1 2.0   ------------------------------------------------------------------------------------------------------------------ No results for input(s): CHOL, HDL, LDLCALC, TRIG, CHOLHDL, LDLDIRECT in the last 72 hours.  Lab Results  Component Value Date   HGBA1C 6.4 (H) 02/22/2021   ------------------------------------------------------------------------------------------------------------------ No results for input(s): TSH, T4TOTAL, T3FREE, THYROIDAB in the last 72 hours.  Invalid input(s): FREET3 ------------------------------------------------------------------------------------------------------------------ No results for input(s): VITAMINB12, FOLATE, FERRITIN, TIBC, IRON, RETICCTPCT in the last 72 hours.  Coagulation profile No results for input(s): INR, PROTIME in the last 168 hours.  No results for input(s): DDIMER in the last 72 hours.  Cardiac Enzymes No results for input(s): CKMB, TROPONINI, MYOGLOBIN in the last 168 hours.  Invalid input(s): CK ------------------------------------------------------------------------------------------------------------------ No results found for: BNP  Micro Results No results found for this or any previous visit (from the past 240 hour(s)).  Radiology Reports DG Chest Port 1 View  Result Date: 03/23/2021 CLINICAL DATA:  Upper respiratory infection with cough and congestion EXAM: PORTABLE CHEST 1 VIEW COMPARISON:  None.  FINDINGS: Patient is rotated. The heart size and mediastinal contours are within normal limits for technique. Both lungs are clear. No pleural effusion or pneumothorax. The visualized skeletal structures are unremarkable. Surgical clips overlie the left axilla. IMPRESSION: No acute process in the chest. Electronically Signed   By: Macy Mis M.D.   On: 03/23/2021 16:04

## 2021-04-10 LAB — BASIC METABOLIC PANEL
Anion gap: 7 (ref 5–15)
BUN: 13 mg/dL (ref 6–20)
CO2: 27 mmol/L (ref 22–32)
Calcium: 9.1 mg/dL (ref 8.9–10.3)
Chloride: 103 mmol/L (ref 98–111)
Creatinine, Ser: 0.92 mg/dL (ref 0.61–1.24)
GFR, Estimated: >60 mL/min (ref >60)
Glucose, Bld: 87 mg/dL (ref 70–99)
Potassium: 4.1 mmol/L (ref 3.5–5.1)
Sodium: 137 mmol/L (ref 135–145)

## 2021-04-10 LAB — CBC
HCT: 37.6 % — ABNORMAL LOW (ref 39.0–52.0)
Hemoglobin: 12 g/dL — ABNORMAL LOW (ref 13.0–17.0)
MCH: 28.6 pg (ref 26.0–34.0)
MCHC: 31.9 g/dL (ref 30.0–36.0)
MCV: 89.5 fL (ref 80.0–100.0)
Platelets: 288 10*3/uL (ref 150–400)
RBC: 4.2 MIL/uL — ABNORMAL LOW (ref 4.22–5.81)
RDW: 12.8 % (ref 11.5–15.5)
WBC: 6 10*3/uL (ref 4.0–10.5)
nRBC: 0 % (ref 0.0–0.2)

## 2021-04-10 LAB — GLUCOSE, CAPILLARY
Glucose-Capillary: 123 mg/dL — ABNORMAL HIGH (ref 70–99)
Glucose-Capillary: 103 mg/dL — ABNORMAL HIGH (ref 70–99)
Glucose-Capillary: 116 mg/dL — ABNORMAL HIGH (ref 70–99)
Glucose-Capillary: 106 mg/dL — ABNORMAL HIGH (ref 70–99)

## 2021-04-10 LAB — MAGNESIUM: Magnesium: 2.1 mg/dL (ref 1.7–2.4)

## 2021-04-10 NOTE — Progress Notes (Signed)
PROGRESS NOTE                                                                                                                                                                                                             Patient Demographics:    Chad Coleman, is a 60 y.o. male, DOB - [birth year], BJS:[id number]  Outpatient Primary MD for the patient is Patient, No Pcp Per (Inactive)   Admit date - 02/21/2021   LOS - 77  Chief Complaint  Patient presents with  . Weakness       Brief Narrative: Patient is a 60 y.o. 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  While in the hospital-patient sustained a mechanical fall-following which we he had worsening of his intraparenchymal hematoma-requiring transfer to the ICU-neurosurgery consulted.  Upon further stability-he was transferred back to telemetry.  Further hospital course was complicated by fever-and was found to have COVID-19 infection.  COVID-19 vaccinated status: Vaccinated but not boosted.   Subjective:   Patient in bed, appears comfortable, denies any headache, no fever, no chest pain or pressure, no shortness of breath , no abdominal pain, +ve L. Sided weakness.   Assessment  & Plan :   COVID 19 infection: Now afebrile-no hypoxia-completely asymptomatic at this point-has completed 3 days of Remdesivir.  Apart from observation-no further therapy required.  He has finished his COVID-19 isolation on 04/02/2021.  ICH: Continues to have dense left-sided deficits-remains on a dysphagia 3 diet-continue PT/OT eval.  Social work following for placement.  Dysphagia: Secondary to above- resolved, on regular diet.  Mild transaminitis: Likely due to COVID-19-stable for monitoring.  HTN: BP stable on  present regimen of Norvasc, Coreg and hydralazine.  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  Close outpatient work-up by PCP and general surgery post discharge.  GERD: Continue PPI  Cocaine/tobacco use: Counseled  DM-2 (A1c 6.4): CBG stable-on SSI and metformin  CBG (last 3)  Recent Labs    04/09/21 1555 04/09/21 2134 04/10/21 0627  GLUCAP 130* 103* 123*  Condition - Stable  Family Communication  : sister [name] [phone] called on 04/03/2021 message left at 9:51 AM  Code Status :  Full Code  DVT prophylaxis - SCDs due to [place]  Diet :  Diet Order            Diet regular Room service appropriate? Yes; Fluid consistency: Thin  Diet effective now                  Disposition Plan  :   Status is: Inpatient  Remains inpatient appropriate because:Inpatient level of care appropriate due to severity of illness   Dispo: The patient is from: Home              Anticipated d/c is to: SNF              Patient currently is medically stable to d/c.   Difficult to place patient No  Barriers to discharge: Awaiting SNF bed  Antimicorbials  :    Anti-infectives (From admission, onward)   Start     Dose/Rate Route Frequency Ordered Stop   03/30/21 0900  cefTRIAXone (ROCEPHIN) 1 g in sodium chloride 0.9 % 100 mL IVPB        1 g 200 mL/hr over 30 Minutes Intravenous Every 24 hours 03/30/21 0800 04/01/21 1750   03/24/21 1000  remdesivir 100 mg in sodium chloride 0.9 % 100 mL IVPB       "Followed by" Linked Group Details   100 mg 200 mL/hr over 30 Minutes Intravenous Daily 03/23/21 1302 03/25/21 1001   03/23/21 1400  remdesivir 200 mg in sodium chloride 0.9% 250 mL IVPB       "Followed by" Linked Group Details   200 mg 580 mL/hr over 30 Minutes Intravenous Once 03/23/21 1302 03/23/21 1531       Inpatient Medications  Scheduled Meds: . amLODipine  10 mg Oral Daily  . atorvastatin  20 mg Oral Daily  . carvedilol  6.25 mg Oral BID WC  . diclofenac Sodium  4 g Topical QID  . feeding supplement  237 mL Oral TID BM  . hydrALAZINE  50 mg Oral Q8H  . insulin aspart  0-5 Units Subcutaneous QHS  . insulin aspart  0-9 Units Subcutaneous TID WC  . metFORMIN  500 mg Oral Q breakfast  . multivitamin with minerals  1 tablet Oral Daily  . pantoprazole  40 mg Oral BID  . polyethylene glycol  17 g Oral Daily  . senna-docusate  1 tablet Oral BID  . sertraline  50 mg Oral Daily  . valproic acid  250 mg Oral BID   Continuous Infusions:  PRN Meds:.acetaminophen **OR** [DISCONTINUED] acetaminophen (TYLENOL) oral liquid 160 mg/5 mL **OR** [DISCONTINUED] acetaminophen, alum & mag hydroxide-simeth, hydroxypropyl methylcellulose / hypromellose, melatonin, ondansetron (ZOFRAN) IV   Time Spent in minutes  15  See all Orders from today for further details   Lala Lund M.D on 04/10/2021 at 10:03 AM  To page go to [email or web address] - use universal password  [facility] -  Office  [phone]    Objective:   Vitals:   04/09/21 2044 04/09/21 2343 04/10/21 0443 04/10/21 0740  BP: 133/85 124/82 125/85 121/90  Pulse: 62 63 (!) 57 60  Resp: 17 16 16 18   Temp: 98.1 F (36.7 C) 98.4 F (36.9 C) 98.2 F (36.8 C) 98.2 F (36.8 C)  TempSrc: Oral Oral Oral Oral  SpO2: 99% 98% 100% 98%  Weight:  Height:        Wt Readings from Last 3 Encounters:  02/25/21 73.2 kg  02/10/21 73.9 kg  09/30/20 78.5 kg     Intake/Output Summary (Last 24 hours) at 04/10/2021 1003 Last data filed at 04/10/2021 0532 Gross per 24 hour  Intake 270 ml  Output 1300 ml  Net -1030 ml     Physical Exam  Awake Alert, L. Hemiparesis  [place].AT,PERRAL Supple Neck,No JVD, No cervical lymphadenopathy appriciated.  Symmetrical Chest wall movement, Good air movement bilaterally, CTAB RRR,No Gallops,  Rubs or new Murmurs, No Parasternal Heave +ve B.Sounds, Abd Soft, No tenderness, No organomegaly appriciated, No rebound - guarding or rigidity. No Cyanosis, Clubbing or edema, No new Rash or bruise    Data Review:    CBC Recent Labs  Lab 04/04/21 0728 04/10/21 0420  WBC 6.9 6.0  HGB 12.6* 12.0*  HCT 39.4 37.6*  PLT 328 288  MCV 89.1 89.5  MCH 28.5 28.6  MCHC 32.0 31.9  RDW 12.6 12.8    Chemistries  Recent Labs  Lab 04/04/21 0728 04/10/21 0420  NA 137 137  K 4.0 4.1  CL 101 103  CO2 29 27  GLUCOSE 80 87  BUN 10 13  CREATININE 0.88 0.92  CALCIUM 9.1 9.1  MG 2.0 2.1   ------------------------------------------------------------------------------------------------------------------ No results for input(s): CHOL, HDL, LDLCALC, TRIG, CHOLHDL, LDLDIRECT in the last 72 hours.  Lab Results  Component Value Date   HGBA1C 6.4 (H) 02/22/2021   ------------------------------------------------------------------------------------------------------------------ No results for input(s): TSH, T4TOTAL, T3FREE, THYROIDAB in the last 72 hours.  Invalid input(s): FREET3 ------------------------------------------------------------------------------------------------------------------ No results for input(s): VITAMINB12, FOLATE, FERRITIN, TIBC, IRON, RETICCTPCT in the last 72 hours.  Coagulation profile No results for input(s): INR, PROTIME in the last 168 hours.  No results for input(s): DDIMER in the last 72 hours.  Cardiac Enzymes No results for input(s): CKMB, TROPONINI, MYOGLOBIN in the last 168 hours.  Invalid input(s): CK ------------------------------------------------------------------------------------------------------------------ No results found for: BNP  Micro Results No results found for this or any previous visit (from the past 240 hour(s)).  Radiology Reports DG Chest Port 1 View  Result Date: 03/23/2021 CLINICAL DATA:  Upper respiratory infection with  cough and congestion EXAM: PORTABLE CHEST 1 VIEW COMPARISON:  None. FINDINGS: Patient is rotated. The heart size and mediastinal contours are within normal limits for technique. Both lungs are clear. No pleural effusion or pneumothorax. The visualized skeletal structures are unremarkable. Surgical clips overlie the left axilla. IMPRESSION: No acute process in the chest. Electronically Signed   By: Macy Mis M.D.   On: 03/23/2021 16:04

## 2021-04-11 LAB — GLUCOSE, CAPILLARY
Glucose-Capillary: 130 mg/dL — ABNORMAL HIGH (ref 70–99)
Glucose-Capillary: 105 mg/dL — ABNORMAL HIGH (ref 70–99)
Glucose-Capillary: 122 mg/dL — ABNORMAL HIGH (ref 70–99)
Glucose-Capillary: 108 mg/dL — ABNORMAL HIGH (ref 70–99)

## 2021-04-11 NOTE — Progress Notes (Signed)
[facility] PROGRESS NOTE  Chad Coleman GQQ:[id number] DOB: 07-23-1961 DOA: 02/21/2021 PCP: Patient, No Pcp Per (Inactive)  Status: Remains inpatient appropriate because:Altered mental status, Unsafe d/c plan and Inpatient level of care appropriate due to severity of illness   Dispo: The patient is from: Home              Anticipated d/c is to: SNF-[facility] on 5/18              Patient currently is medically stable to d/c.    Difficult to place patient Yes-Medicaid pending   Level of care: Med-Surg  Code Status: Full Family Communication: 5/2 Sister Chad Coleman [phone]. DVT prophylaxis: SCDs Vaccination status: Fully vaccinated with Moderna vaccine with second shot in August 2021-has not received a booster shot   HPI: 60 year old gentleman prior history of hypertension, cocaine use, tobacco abuse admitted on 02/21/2021 with a code stroke was found to have intracranial hemorrhage in the right basal ganglia secondary to hypertension and cocaine use.  Patient was transferred from neurology service to hospitalist service on 02/26/2021.   Pt had a fall on 03/03/21, he underwent CT head showing increased size of the intraparenchymal hematoma with worsening leftward midline shift. He was transferred to ICU overnight, neuro surgery consulted and a repeat CT head ordered which was stable .  Pt had another fall in the room on 03/05/21, repeat CT head without contrast showed unchanged appearance of right basal ganglia intraparenchymal hematoma with 7 mm of leftward midline shift.  Since admission patient has done well w/recommendation from PT and OT for SNF.  Patient stable for discharge but was found to be COVID-positive as of 4/27.  He was mildly symptomatic with fever and cough.  He has completed 3 days of remdesivir and quarantine was complete as of 5/7.  Please refer to note dictated on 4/27 for expanded details regarding hospitalization.  Subjective: And much more alert than last  week.  Reporting post COVID malaise is significantly improving as is his appetite.  Objective: Vitals:   04/11/21 0400 04/11/21 0724  BP: 125/85 121/84  Pulse: 61 65  Resp: 18 16  Temp: 98.1 F (36.7 C) (!) 97.4 F (36.3 C)  SpO2: 97% 99%    Intake/Output Summary (Last 24 hours) at 04/11/2021 0814 Last data filed at 04/11/2021 8338 Gross per 24 hour  Intake 290 ml  Output 1075 ml  Net -785 ml   Filed Weights   02/25/21 1357  Weight: 73.2 kg    Exam:  Constitutional:, Calm, no acute distress Respiratory: Lungs are clear, room air Cardiovascular: S1-S2, regular pulse, skin warm and dry, no edema Abdomen:   LBM 5/14, abdomen soft, nontender nondistended, eating well.  Obtained an extra portion of breakfast this morning Neurologic: Cranial nerves are intact, persistent dense left hemiplegia and unable to ambulate. Strength on right WNL [place], oriented x3.   Assessment/Plan: Acute problems: Mild COVID 19 infection without pneumonia Has completed 10 days of isolation from the day of diagnosis given he did not have a severe case of COVID and did not have pneumonia.  Postinfection fatigue and anorexia resolving  Hyponatremia secondary to dehydration Sodium has normalized to 135 after administration of short-term IV fluids   Intracranial hemorrhage with associated brain injury Zoloft 50 mg daily for brain injury related impulsivity Cognitive eval per OT/SLP with documented deficits  Proteus UTI/pansensitive Has completed 3 days of IV Rocephin  Dysphagia Has advanced to a regular diet with thin liquids  Physical Deconditioning -  Secondary to recent stroke -PT/OT recommend SNF for rehabilitative therapy -Continue PRAFO shoe   Hypertension Continue carvedilol, Norvasc and hydralazine Continue to hold lisinopril (consider discontinuing since no history of heart failure and this agent typically ineffective in African-Americans), clonidine, and isosorbide as  we gradually add back antihypertensive agents  Hyperlipidemia Continue statin.  Type 2 diabetes mellitus controlled with hyperglycemia Hemoglobin A1 c is 6.4.  Metformin and SSI     Other problems: Hypokalemia and hyponatremia Resolved  Mild elevation of liver enzymes.  Resolved Unremarkable abdominal ultrasound   Data Reviewed: Basic Metabolic Panel: Recent Labs  Lab 04/10/21 0420  NA 137  K 4.1  CL 103  CO2 27  GLUCOSE 87  BUN 13  CREATININE 0.92  CALCIUM 9.1  MG 2.1   Liver Function Tests: No results for input(s): AST, ALT, ALKPHOS, BILITOT, PROT, ALBUMIN in the last 168 hours. CBC: Recent Labs  Lab 04/10/21 0420  WBC 6.0  HGB 12.0*  HCT 37.6*  MCV 89.5  PLT 288   Cardiac Enzymes: No results for input(s): CKTOTAL, CKMB, CKMBINDEX, TROPONINI in the last 168 hours.   CBG: Recent Labs  Lab 04/10/21 0627 04/10/21 1239 04/10/21 1615 04/10/21 2141 04/11/21 0618  GLUCAP 123* 103* 116* 106* 130*       Studies: No results found.  Scheduled Meds: . amLODipine  10 mg Oral Daily  . atorvastatin  20 mg Oral Daily  . carvedilol  6.25 mg Oral BID WC  . diclofenac Sodium  4 g Topical QID  . feeding supplement  237 mL Oral TID BM  . hydrALAZINE  50 mg Oral Q8H  . insulin aspart  0-5 Units Subcutaneous QHS  . insulin aspart  0-9 Units Subcutaneous TID WC  . metFORMIN  500 mg Oral Q breakfast  . multivitamin with minerals  1 tablet Oral Daily  . pantoprazole  40 mg Oral BID  . polyethylene glycol  17 g Oral Daily  . senna-docusate  1 tablet Oral BID  . sertraline  50 mg Oral Daily  . valproic acid  250 mg Oral BID   Continuous Infusions:   Active Problems:   Intraparenchymal hemorrhage of brain (HCC)   Hypertension   Tobacco abuse   Hyponatremia   Leukocytosis   Controlled type 2 diabetes mellitus with hyperglycemia, without long-term current use of insulin ([place])   Brain injury with brief loss of consciousness ([employer])   Acute pain of  right shoulder   URI with cough and congestion   Fever, unspecified   COVID-19 virus infection   Consultants:  Neurology  Neurosurgery  Procedures:  Echocardiogram  Antibiotics: Remdesivir 4/27-4/29  Time spent: 20 minutes    Erin Hearing ANP  [facility] 7 am - 330 pm/M-F for direct patient care and secure chat Please refer to Amion for contact info 49  days

## 2021-04-11 NOTE — Progress Notes (Signed)
CSW spoke with Tressa Busman at [facility] who confirms the patient can be admitted to the facility on 04/13/21.  Madilyn Fireman, MSW, LCSW Transitions of Care  Clinical Social Worker II [phone]

## 2021-04-12 LAB — GLUCOSE, CAPILLARY
Glucose-Capillary: 88 mg/dL (ref 70–99)
Glucose-Capillary: 117 mg/dL — ABNORMAL HIGH (ref 70–99)
Glucose-Capillary: 88 mg/dL (ref 70–99)
Glucose-Capillary: 110 mg/dL — ABNORMAL HIGH (ref 70–99)

## 2021-04-12 MED ORDER — POLYETHYLENE GLYCOL 3350 17 G PO PACK: 17.0000 g | PACK | Freq: Every day | ORAL | 0 refills | Status: AC

## 2021-04-12 MED ORDER — SENNOSIDES-DOCUSATE SODIUM 8.6-50 MG PO TABS: 1.0000 | ORAL_TABLET | Freq: Two times a day (BID) | ORAL | Status: DC

## 2021-04-12 MED ORDER — METFORMIN HCL 500 MG PO TABS: 500.0000 mg | ORAL_TABLET | Freq: Every day | ORAL | Status: AC

## 2021-04-12 MED ORDER — CARVEDILOL 6.25 MG PO TABS: 6.2500 mg | ORAL_TABLET | Freq: Two times a day (BID) | ORAL | Status: DC

## 2021-04-12 MED ORDER — SERTRALINE HCL 50 MG PO TABS: 50.0000 mg | ORAL_TABLET | Freq: Every day | ORAL | Status: DC

## 2021-04-12 MED ORDER — HYDRALAZINE HCL 50 MG PO TABS: 50.0000 mg | ORAL_TABLET | Freq: Three times a day (TID) | ORAL | Status: DC

## 2021-04-12 MED ORDER — PANTOPRAZOLE SODIUM 40 MG PO TBEC: 40.0000 mg | DELAYED_RELEASE_TABLET | Freq: Two times a day (BID) | ORAL | Status: AC

## 2021-04-12 MED ORDER — VALPROIC ACID 250 MG PO CAPS: 250.0000 mg | ORAL_CAPSULE | Freq: Two times a day (BID) | ORAL | Status: DC

## 2021-04-12 MED ORDER — ATORVASTATIN CALCIUM 10 MG PO TABS
20.0000 mg | ORAL_TABLET | Freq: Every day | ORAL | Status: AC
Start: 2021-04-13 — End: ?

## 2021-04-12 MED ORDER — INSULIN ASPART 100 UNIT/ML ~~LOC~~ SOLN: 0.0000 [IU] | Freq: Three times a day (TID) | SUBCUTANEOUS | 11 refills | Status: DC

## 2021-04-12 MED ORDER — ALUM & MAG HYDROXIDE-SIMETH 200-200-20 MG/5ML PO SUSP: 30.0000 mL | Freq: Four times a day (QID) | ORAL | 0 refills | Status: DC | PRN

## 2021-04-12 MED ORDER — DICLOFENAC SODIUM 1 % EX GEL: 4.0000 g | Freq: Four times a day (QID) | CUTANEOUS | Status: DC

## 2021-04-12 MED ORDER — ACETAMINOPHEN 325 MG PO TABS: 650.0000 mg | ORAL_TABLET | ORAL | Status: DC | PRN

## 2021-04-12 MED ORDER — INSULIN ASPART 100 UNIT/ML ~~LOC~~ SOLN: 0.0000 [IU] | Freq: Every day | SUBCUTANEOUS | 11 refills | Status: DC

## 2021-04-12 MED ORDER — AMLODIPINE BESYLATE 10 MG PO TABS: 10.0000 mg | ORAL_TABLET | Freq: Every day | ORAL | Status: AC

## 2021-04-12 MED ORDER — ADULT MULTIVITAMIN W/MINERALS CH: 1.0000 | ORAL_TABLET | Freq: Every day | ORAL | Status: AC

## 2021-04-12 MED ORDER — HYPROMELLOSE (GONIOSCOPIC) 2.5 % OP SOLN: 2.0000 [drp] | OPHTHALMIC | 12 refills | Status: DC | PRN

## 2021-04-12 MED ORDER — ENSURE ENLIVE PO LIQD: 237.0000 mL | Freq: Three times a day (TID) | ORAL | 12 refills | Status: DC

## 2021-04-12 MED ORDER — MELATONIN 5 MG PO TABS
5.0000 mg | ORAL_TABLET | Freq: Every evening | ORAL | 0 refills | Status: AC | PRN
Start: 2021-04-12 — End: ?

## 2021-04-12 NOTE — Progress Notes (Signed)
Physical Therapy Treatment Patient Details Name: Chad Coleman MRN: [id number] DOB: May 13, 1961 Today's Date: 04/12/2021    History of Present Illness 60 yo male presents to [facility] on 3/28 with acute onset of L weakness, numbness, and L facial droop. CTH R BG ICH, suspect hypertensive.  Fall on 02/28/21. imaging showing increased in R intraparenchymal hematoma with worsening leftward midline shift on 4/7. Tested postive for COVID on 4/27, off precautions 5/7. PMH includes hypertension, tobacco use, noncompliance with antihypertensive medications, and cocaine abuse.    PT Comments    Pt with minimal verbalization during session with PT/OT but all comments appropriate and pt following all commands to his ability. Worked on sit<>stand from bed and recliner with pregait activities performed in standing in front of mirror. Pt tends to rotate trunk with R lean when trying to perform UE tasks in standing. L knee supported throughout standing and worked on increasing attention to LUE with transfers. PT will continue to follow.    Follow Up Recommendations  Supervision/Assistance - 24 hour;SNF     Equipment Recommendations  Wheelchair (measurements PT);Wheelchair cushion (measurements PT)    Recommendations for Other Services       Precautions / Restrictions Precautions Precautions: Fall Precaution Comments: Pt with 2 in hospital falls.  mild L inattention, impulsive Restrictions Weight Bearing Restrictions: No    Mobility  Bed Mobility Overal bed mobility: Needs Assistance Bed Mobility: Supine to Sit     Supine to sit: Min assist     General bed mobility comments: pt needs vc's for using RLE to assist L and allowed to use rail and HOB elevated but with increased time was able to come to edge with min A to scoot L hip fwd.    Transfers Overall transfer level: Needs assistance Equipment used: 2 person hand held assist Transfers: Sit to/from [facility] Sit to Stand:  Mod assist;Min assist;+2 physical assistance Stand pivot transfers: Mod assist;+2 physical assistance       General transfer comment: mod A +2 for sit>stand from bed and recliner with stability control needed at L knee and facilitation of WB'ing through LUE with sit>stand  Ambulation/Gait             General Gait Details: pre-gait tasks only: weight shifting L and R with L knee blocked, working on maintaining midline with dynamic activity   Stairs             Wheelchair Mobility    Modified Rankin (Stroke Patients Only) Modified Rankin (Stroke Patients Only) Pre-Morbid Rankin Score: No symptoms Modified Rankin: Severe disability     Balance Overall balance assessment: Needs assistance Sitting-balance support: Feet supported;Single extremity supported Sitting balance-Leahy Scale: Poor Sitting balance - Comments: cues for maintaining midline Postural control: Left lateral lean Standing balance support: Bilateral upper extremity supported Standing balance-Leahy Scale: Poor Standing balance comment: requires external assist to maintain standing               High Level Balance Comments: worked on RUE reaching in sitting, when pt reaching to R able to maintain sitting with min A.            Cognition Arousal/Alertness: Awake/alert Behavior During Therapy: WFL for tasks assessed/performed Overall Cognitive Status: Impaired/Different from baseline Area of Impairment: Attention;Following commands;Safety/judgement;Problem solving;Awareness                   Current Attention Level: Selective Memory: Decreased short-term memory Following Commands: Follows one step commands consistently;Follows multi-step commands inconsistently  Safety/Judgement: Decreased awareness of safety Awareness: Emergent Problem Solving: Requires verbal cues;Difficulty sequencing General Comments: pt following commands with increased time, difficulty following mulitple step  commands and attending to tasks.  continues to L neglect.  limited verbalizations.      Exercises Other Exercises Other Exercises: Seated tasks:  EOB sitting x10 minutes, faciliation of L UE/scapula for increased mobility for elevation/depression, retraction/protraction given humerus support. Forearm propping to decrease tone. Other Exercises: While standing worked on [employer], L UE flexion/extension at shoulder (AAROm to flex sliding on counter, AROM to extend) Other Exercises: PROM of L UE From shoulder to hand    General Comments        Pertinent Vitals/Pain Pain Assessment: Faces Faces Pain Scale: Hurts little more Pain Location: L shoulder Pain Descriptors / Indicators: Grimacing;Discomfort Pain Intervention(s): Limited activity within patient's tolerance;Monitored during session;Repositioned    Home Living                      Prior Function            PT Goals (current goals can now be found in the care plan section) Acute Rehab PT Goals Patient Stated Goal: to go home PT Goal Formulation: With patient Time For Goal Achievement: 04/20/21 Potential to Achieve Goals: Fair Progress towards PT goals: Progressing toward goals    Frequency    Min 3X/week      PT Plan Current plan remains appropriate    Co-evaluation PT/OT/SLP Co-Evaluation/Treatment: Yes Reason for Co-Treatment: Complexity of the patient's impairments (multi-system involvement);For patient/therapist safety PT goals addressed during session: Mobility/safety with mobility;Balance;Strengthening/ROM OT goals addressed during session: ADL's and self-care      AM-PAC PT "6 Clicks" Mobility   Outcome Measure  Help needed turning from your back to your side while in a flat bed without using bedrails?: A Lot Help needed moving from lying on your back to sitting on the side of a flat bed without using bedrails?: A Lot Help needed moving to and from a bed to a chair (including a  wheelchair)?: A Lot Help needed standing up from a chair using your arms (e.g., wheelchair or bedside chair)?: A Lot Help needed to walk in hospital room?: Total Help needed climbing 3-5 steps with a railing? : Total 6 Click Score: 10    End of Session Equipment Utilized During Treatment: Gait belt Activity Tolerance: Patient tolerated treatment well Patient left: in chair;with call bell/phone within reach;with chair alarm set Nurse Communication: Mobility status PT Visit Diagnosis: Hemiplegia and hemiparesis;Unsteadiness on feet (R26.81);Muscle weakness (generalized) (M62.81);Difficulty in walking, not elsewhere classified (R26.2);Other symptoms and signs involving the nervous system (R29.898) Hemiplegia - Right/Left: Left Hemiplegia - dominant/non-dominant: Dominant Hemiplegia - caused by: Nontraumatic intracerebral hemorrhage     Time: 1230-1308 PT Time Calculation (min) (ACUTE ONLY): 38 min  Charges:  $Gait Training: 8-22 mins                     Lynn  Pager [phone] Office [place] 04/12/2021, 4:43 PM

## 2021-04-12 NOTE — Progress Notes (Signed)
Occupational Therapy Treatment Patient Details Name: Chad Coleman MRN: [id number] DOB: Jul 28, 1961 Today's Date: 04/12/2021    History of present illness 60 yo male presents to [facility] on 3/28 with acute onset of L weakness, numbness, and L facial droop. CTH R BG ICH, suspect hypertensive.  Fall on 02/28/21. imaging showing increased in R intraparenchymal hematoma with worsening leftward midline shift on 4/7. Tested postive for COVID on 4/27, off precautions 5/7. PMH includes hypertension, tobacco use, noncompliance with antihypertensive medications, and cocaine abuse.   OT comments  Patient supine in bed and agreeable to OT/PT session. Transferred to recliner with mod assist +2, from recliner completing sit to stand x 2 with mod assist +2.  Standing at sink engaged in grooming, weightbearing into L hand and L UE slides on countertop.  Prior to transfer completed L UE exercises and PROM to UE from shoulder to hand with scapular mobilizations.  Pt following commands with increased time, difficulty with attention and multiple step commands; L inattention/neglect remains. SNF appropriate.  Will follow.    Follow Up Recommendations  SNF    Equipment Recommendations  Wheelchair (measurements OT);Wheelchair cushion (measurements OT)    Recommendations for Other Services      Precautions / Restrictions Precautions Precautions: Fall Precaution Comments: Pt with 2 in hospital falls.  mild L inattention, impulsive Restrictions Weight Bearing Restrictions: No       Mobility Bed Mobility Overal bed mobility: Needs Assistance Bed Mobility: Supine to Sit     Supine to sit: Min assist     General bed mobility comments: pt needs vc's for using RLE to assist L and allowed to use rail and HOB elevated but with increased time was able to come to edge with min A to scoot L hip fwd.    Transfers Overall transfer level: Needs assistance Equipment used: 2 person hand held assist Transfers: Sit  to/from [facility] Sit to Stand: Mod assist;Min assist;+2 physical assistance Stand pivot transfers: Mod assist;+2 physical assistance       General transfer comment: mod A +2 for sit>stand from bed and recliner with stability control needed at L knee and facilitation of WB'ing through LUE with sit>stand    Balance Overall balance assessment: Needs assistance Sitting-balance support: Feet supported;Single extremity supported Sitting balance-Leahy Scale: Poor Sitting balance - Comments: cues for maintaining midline Postural control: Left lateral lean Standing balance support: Bilateral upper extremity supported Standing balance-Leahy Scale: Poor Standing balance comment: requires external assist to maintain standing               High Level Balance Comments: worked on RUE reaching in sitting, when pt reaching to R able to maintain sitting with min A.           ADL either performed or assessed with clinical judgement   ADL Overall ADL's : Needs assistance/impaired     Grooming: Brushing hair;Moderate assistance;Standing Grooming Details (indicate cue type and reason): assist to maintain balance in standing, using R UE to comb hair; applying lotion to L hand with setup assist             Lower Body Dressing: Moderate assistance;Sit to/from stand Lower Body Dressing Details (indicate cue type and reason): donning socks supine  in bed, min assist for L sock but able to don R sock.  continues to require 2+ assist to sit to stand, mod-max assist when standing for ADL tasks Toilet Transfer: Moderate assistance;+2 for physical assistance;+2 for safety/equipment Toilet Transfer Details (indicate cue  type and reason): simulated to recliner         Functional mobility during ADLs: Moderate assistance;+2 for physical assistance;+2 for safety/equipment General ADL Comments: pt remains limited by L weakness, balance, L inattention and cognition     Vision        Perception     Praxis      Cognition Arousal/Alertness: Awake/alert Behavior During Therapy: WFL for tasks assessed/performed Overall Cognitive Status: Impaired/Different from baseline Area of Impairment: Attention;Following commands;Safety/judgement;Problem solving;Awareness                   Current Attention Level: Selective Memory: Decreased short-term memory Following Commands: Follows one step commands consistently;Follows multi-step commands inconsistently Safety/Judgement: Decreased awareness of safety Awareness: Emergent Problem Solving: Requires verbal cues;Difficulty sequencing General Comments: pt following commands with increased time, difficulty following mulitple step commands and attending to tasks.  continues to L neglect.  limited verbalizations.        Exercises Exercises: Other exercises General Exercises - Upper Extremity Shoulder Flexion: Self ROM;Left;5 reps;Supine General Exercises - Lower Extremity Long Arc Quad: PROM;Left;10 reps;Seated Heel Slides: AROM;Both;10 reps;Supine Hip ABduction/ADduction: AROM;Both;10 reps;Supine Hand Exercises Wrist Flexion: PROM;Left Wrist Extension: PROM;Left Digit Composite Flexion: PROM;Left Composite Extension: PROM;Left Low Level/ICU Exercises Stabilized Bridging: AROM;Both;10 reps;Supine Other Exercises Other Exercises: Seated tasks:  EOB sitting x10 minutes, faciliation of L UE/scapula for increased mobility for elevation/depression, retraction/protraction given humerus support. Forearm propping to decrease tone. Other Exercises: While standing worked on [employer], L UE flexion/extension at shoulder (AAROm to flex sliding on counter, AROM to extend) Other Exercises: PROM of L UE From shoulder to hand   Shoulder Instructions       General Comments      Pertinent Vitals/ Pain       Pain Assessment: Faces Faces Pain Scale: Hurts little more Pain Location: L shoulder Pain Descriptors /  Indicators: Grimacing;Discomfort Pain Intervention(s): Limited activity within patient's tolerance;Monitored during session;Repositioned  Home Living                                          Prior Functioning/Environment              Frequency  Min 2X/week        Progress Toward Goals  OT Goals(current goals can now be found in the care plan section)  Progress towards OT goals: Progressing toward goals  Acute Rehab OT Goals Patient Stated Goal: to go home OT Goal Formulation: With patient  Plan Discharge plan remains appropriate;Frequency remains appropriate    Co-evaluation    PT/OT/SLP Co-Evaluation/Treatment: Yes Reason for Co-Treatment: Complexity of the patient's impairments (multi-system involvement);For patient/therapist safety PT goals addressed during session: Mobility/safety with mobility;Balance;Strengthening/ROM OT goals addressed during session: ADL's and self-care      AM-PAC OT "6 Clicks" Daily Activity     Outcome Measure   Help from another person eating meals?: A Little Help from another person taking care of personal grooming?: A Little Help from another person toileting, which includes using toliet, bedpan, or urinal?: A Lot Help from another person bathing (including washing, rinsing, drying)?: A Lot Help from another person to put on and taking off regular upper body clothing?: A Lot Help from another person to put on and taking off regular lower body clothing?: A Lot 6 Click Score: 14    End of Session Equipment Utilized During Treatment: Gait  belt  OT Visit Diagnosis: Unsteadiness on feet (R26.81);Other abnormalities of gait and mobility (R26.89);Muscle weakness (generalized) (M62.81);Hemiplegia and hemiparesis;Pain Hemiplegia - Right/Left: Left Hemiplegia - dominant/non-dominant: Dominant Hemiplegia - caused by: Cerebral infarction Pain - Right/Left: Left Pain - part of body: Shoulder   Activity Tolerance Patient  tolerated treatment well   Patient Left in chair;with call bell/phone within reach;with chair alarm set   Nurse Communication Mobility status        Time: 9476-5465 OT Time Calculation (min): 38 min  Charges: OT General Charges $OT Visit: 1 Visit OT Treatments $Self Care/Home Management : 8-22 mins  Jolaine Artist, OT Acute Rehabilitation Services Pager [phone] Office [phone]    Delight Stare 04/12/2021, 3:20 PM

## 2021-04-12 NOTE — Progress Notes (Signed)
[facility] PROGRESS NOTE  Chad Coleman KNL:[id number] DOB: 29-Nov-1960 DOA: 02/21/2021 PCP: Patient, No Pcp Per (Inactive)  Status: Remains inpatient appropriate because:Altered mental status, Unsafe d/c plan and Inpatient level of care appropriate due to severity of illness   Dispo: The patient is from: Home              Anticipated d/c is to: SNF-[facility] on 5/18              Patient currently is medically stable to d/c.    Difficult to place patient Yes-Medicaid pending   Level of care: Med-Surg  Code Status: Full Family Communication: 5/2 [name] [phone]. DVT prophylaxis: SCDs Vaccination status: Fully vaccinated with Moderna vaccine with second shot in August 2021-has not received a booster shot   HPI: 60 year old gentleman prior history of hypertension, cocaine use, tobacco abuse admitted on 02/21/2021 with a code stroke was found to have intracranial hemorrhage in the right basal ganglia secondary to hypertension and cocaine use.  Patient was transferred from neurology service to hospitalist service on 02/26/2021.   Pt had a fall on 03/03/21, he underwent CT head showing increased size of the intraparenchymal hematoma with worsening leftward midline shift. He was transferred to ICU overnight, neuro surgery consulted and a repeat CT head ordered which was stable .  Pt had another fall in the room on 03/05/21, repeat CT head without contrast showed unchanged appearance of right basal ganglia intraparenchymal hematoma with 7 mm of leftward midline shift.  Since admission patient has done well w/recommendation from PT and OT for SNF.  Patient stable for discharge but was found to be COVID-positive as of 4/27.  He was mildly symptomatic with fever and cough.  He has completed 3 days of remdesivir and quarantine was complete as of 5/7.  Please refer to note dictated on 4/27 for expanded details regarding hospitalization.  Subjective: Patient awake.  In good  spirits.  Eager to discharge home stating "I want to get better"  Objective: Vitals:   04/12/21 0012 04/12/21 0326  BP: 113/74 130/87  Pulse: 62 61  Resp: 18 18  Temp: 98.2 F (36.8 C) 98.2 F (36.8 C)  SpO2: 96% 98%   No intake or output data in the 24 hours ending 04/12/21 0751 Filed Weights   02/25/21 1357  Weight: 73.2 kg    Exam:  Constitutional: Alert, calm, no acute distress Respiratory: Lungs are clear, no increased work of breathing.  Room air Cardiovascular: S1 S2, regular pulse, no peripheral edema.  Skin warm and dry Abdomen:   LBM 5/15, soft nontender nondistended.  Improved intake Neurologic: Cranial nerves are intact, persistent dense left hemiplegia and unable to ambulate. Strength on right WNL Psychiatric alert and oriented x3.  Occasionally becomes mildly confused but is easily reoriented.   Assessment/Plan: Acute problems: Mild COVID 19 infection without pneumonia Has completed 10 days of isolation from the day of diagnosis given he did not have a severe case of COVID and did not have pneumonia.  Postinfection fatigue and anorexia resolved  Hyponatremia secondary to dehydration Sodium has normalized to 135 after administration of short-term IV fluids   Intracranial hemorrhage with associated brain injury Zoloft 50 mg daily for brain injury related impulsivity Cognitive eval per OT/SLP with documented deficits  Proteus UTI/pansensitive Has completed 3 days of IV Rocephin  Dysphagia Has advanced to a regular diet with thin liquids  Physical Deconditioning -Secondary to recent stroke -PT/OT recommend SNF for rehabilitative therapy -Continue PRAFO shoe  Hypertension Continue carvedilol, Norvasc and hydralazine  Hyperlipidemia Continue statin.  Type 2 diabetes mellitus controlled with hyperglycemia Hemoglobin A1 c is 6.4.  Metformin and SSI     Other problems: Hypokalemia and hyponatremia Resolved  Mild elevation of liver  enzymes.  Resolved Unremarkable abdominal ultrasound   Data Reviewed: Basic Metabolic Panel: Recent Labs  Lab 04/10/21 0420  NA 137  K 4.1  CL 103  CO2 27  GLUCOSE 87  BUN 13  CREATININE 0.92  CALCIUM 9.1  MG 2.1   Liver Function Tests: No results for input(s): AST, ALT, ALKPHOS, BILITOT, PROT, ALBUMIN in the last 168 hours. CBC: Recent Labs  Lab 04/10/21 0420  WBC 6.0  HGB 12.0*  HCT 37.6*  MCV 89.5  PLT 288   Cardiac Enzymes: No results for input(s): CKTOTAL, CKMB, CKMBINDEX, TROPONINI in the last 168 hours.   CBG: Recent Labs  Lab 04/11/21 0618 04/11/21 1116 04/11/21 1526 04/11/21 2122 04/12/21 0629  GLUCAP 130* 105* 122* 108* 88       Studies: No results found.  Scheduled Meds: . amLODipine  10 mg Oral Daily  . atorvastatin  20 mg Oral Daily  . carvedilol  6.25 mg Oral BID WC  . diclofenac Sodium  4 g Topical QID  . feeding supplement  237 mL Oral TID BM  . hydrALAZINE  50 mg Oral Q8H  . insulin aspart  0-5 Units Subcutaneous QHS  . insulin aspart  0-9 Units Subcutaneous TID WC  . metFORMIN  500 mg Oral Q breakfast  . multivitamin with minerals  1 tablet Oral Daily  . pantoprazole  40 mg Oral BID  . polyethylene glycol  17 g Oral Daily  . senna-docusate  1 tablet Oral BID  . sertraline  50 mg Oral Daily  . valproic acid  250 mg Oral BID   Continuous Infusions:   Active Problems:   Intraparenchymal hemorrhage of brain (HCC)   Hypertension   Tobacco abuse   Hyponatremia   Leukocytosis   Controlled type 2 diabetes mellitus with hyperglycemia, without long-term current use of insulin ([place])   Brain injury with brief loss of consciousness ([place])   Acute pain of right shoulder   URI with cough and congestion   Fever, unspecified   COVID-19 virus infection   Consultants:  Neurology  Neurosurgery  Procedures:  Echocardiogram  Antibiotics: Remdesivir 4/27-4/29  Time spent: 20 minutes    Erin Hearing ANP  [facility] 7 am - 330 pm/M-F for direct patient care and secure chat Please refer to Amion for contact info 50  days

## 2021-04-12 NOTE — Plan of Care (Signed)
  Problem: Coping: Goal: Will verbalize positive feelings about self Outcome: Progressing Goal: Will identify appropriate support needs Outcome: Progressing   Problem: Health Behavior/Discharge Planning: Goal: Ability to manage health-related needs will improve Outcome: Progressing   Problem: Self-Care: Goal: Ability to participate in self-care as condition permits will improve Outcome: Progressing Goal: Verbalization of feelings and concerns over difficulty with self-care will improve Outcome: Progressing Goal: Ability to communicate needs accurately will improve Outcome: Progressing   Problem: Nutrition: Goal: Risk of aspiration will decrease Outcome: Progressing Goal: Dietary intake will improve Outcome: Progressing   Problem: Intracerebral Hemorrhage Tissue Perfusion: Goal: Complications of Intracerebral Hemorrhage will be minimized Outcome: Progressing   Problem: Education: Goal: Knowledge of General Education information will improve Description: Including pain rating scale, medication(s)/side effects and non-pharmacologic comfort measures Outcome: Progressing   Problem: Health Behavior/Discharge Planning: Goal: Ability to manage health-related needs will improve Outcome: Progressing   Problem: Clinical Measurements: Goal: Ability to maintain clinical measurements within normal limits will improve Outcome: Progressing Goal: Will remain free from infection Outcome: Progressing Goal: Diagnostic test results will improve Outcome: Progressing Goal: Respiratory complications will improve Outcome: Progressing Goal: Cardiovascular complication will be avoided Outcome: Progressing   Problem: Activity: Goal: Risk for activity intolerance will decrease Outcome: Progressing   Problem: Nutrition: Goal: Adequate nutrition will be maintained Outcome: Progressing   Problem: Coping: Goal: Level of anxiety will decrease Outcome: Progressing   Problem:  Elimination: Goal: Will not experience complications related to bowel motility Outcome: Progressing Goal: Will not experience complications related to urinary retention Outcome: Progressing   Problem: Pain Managment: Goal: General experience of comfort will improve Outcome: Progressing   Problem: Safety: Goal: Ability to remain free from injury will improve Outcome: Progressing   Problem: Skin Integrity: Goal: Risk for impaired skin integrity will decrease Outcome: Progressing   Problem: Education: Goal: Knowledge of disease or condition will improve Outcome: Progressing Goal: Knowledge of secondary prevention will improve Outcome: Progressing Goal: Knowledge of patient specific risk factors addressed and post discharge goals established will improve Outcome: Progressing Goal: Individualized Educational Video(s) Outcome: Progressing   

## 2021-04-13 LAB — GLUCOSE, CAPILLARY
Glucose-Capillary: 103 mg/dL — ABNORMAL HIGH (ref 70–99)
Glucose-Capillary: 101 mg/dL — ABNORMAL HIGH (ref 70–99)
Glucose-Capillary: 102 mg/dL — ABNORMAL HIGH (ref 70–99)

## 2021-04-13 NOTE — Progress Notes (Signed)
EMS transport arrived to transport patient to [place].   Patient's only bleingings were glasses and a splint, which I put on his person.  It was not clear from notes wether previous nurse was able to call report, so I attempted to call report to their listed number,  [phone].  I did not get an answer.  Unfortunately, the line did not lead to a messaging system so I was not able to leave a message.  I made unsuccessful attempts at 2125 and 2133.  Vital signs and LOC stable upon departure.  Pt alert, conversational, fully oriented.

## 2021-04-13 NOTE — Plan of Care (Signed)
Problem: Coping: Goal: Will verbalize positive feelings about self Outcome: Adequate for Discharge Goal: Will identify appropriate support needs Outcome: Adequate for Discharge   Problem: Health Behavior/Discharge Planning: Goal: Ability to manage health-related needs will improve Outcome: Adequate for Discharge   Problem: Self-Care: Goal: Ability to participate in self-care as condition permits will improve Outcome: Adequate for Discharge Goal: Verbalization of feelings and concerns over difficulty with self-care will improve Outcome: Adequate for Discharge Goal: Ability to communicate needs accurately will improve Outcome: Adequate for Discharge   Problem: Nutrition: Goal: Risk of aspiration will decrease Outcome: Adequate for Discharge Goal: Dietary intake will improve Outcome: Adequate for Discharge   Problem: Intracerebral Hemorrhage Tissue Perfusion: Goal: Complications of Intracerebral Hemorrhage will be minimized Outcome: Adequate for Discharge   Problem: SLP Dysphagia Goals Goal: Patient will utilize recommended strategies Description: Patient will utilize recommended strategies during swallow to increase swallowing safety with Outcome: Adequate for Discharge   Problem: SLP Cognition Goals Goal: Patient will demonstrate attention to functional Description: Patient will demonstrate attention to functional task with Outcome: Adequate for Discharge Goal: Patient will demonstrate problem solving skills Description: Patient will demonstrate problem solving skills during functional ADL's with Outcome: Adequate for Discharge   Problem: Acute Rehab PT Goals(only PT should resolve) Goal: Pt Will Go Supine/Side To Sit Outcome: Adequate for Discharge Goal: Patient Will Transfer Sit To/From Stand Outcome: Adequate for Discharge Goal: Pt Will Transfer Bed To Chair/Chair To Bed Outcome: Adequate for Discharge Goal: Pt Will Perform Standing Balance Or Pre-Gait Outcome:  Adequate for Discharge Goal: Pt Will Ambulate Outcome: Adequate for Discharge Goal: Pt/caregiver will Perform Home Exercise Program Outcome: Adequate for Discharge   Problem: Acute Rehab OT Goals (only OT should resolve) Goal: Pt. Will Perform Grooming Outcome: Adequate for Discharge Goal: Pt. Will Perform Upper Body Bathing Outcome: Adequate for Discharge Goal: Pt. Will Transfer To Toilet Outcome: Adequate for Discharge Goal: OT Additional ADL Goal #1 Outcome: Adequate for Discharge Goal: OT Additional ADL Goal #2 Outcome: Adequate for Discharge   Problem: Education: Goal: Knowledge of General Education information will improve Description: Including pain rating scale, medication(s)/side effects and non-pharmacologic comfort measures Outcome: Adequate for Discharge   Problem: Health Behavior/Discharge Planning: Goal: Ability to manage health-related needs will improve Outcome: Adequate for Discharge   Problem: Clinical Measurements: Goal: Ability to maintain clinical measurements within normal limits will improve Outcome: Adequate for Discharge Goal: Will remain free from infection Outcome: Adequate for Discharge Goal: Diagnostic test results will improve Outcome: Adequate for Discharge Goal: Respiratory complications will improve Outcome: Adequate for Discharge Goal: Cardiovascular complication will be avoided Outcome: Adequate for Discharge   Problem: Activity: Goal: Risk for activity intolerance will decrease Outcome: Adequate for Discharge   Problem: Nutrition: Goal: Adequate nutrition will be maintained Outcome: Adequate for Discharge   Problem: Coping: Goal: Level of anxiety will decrease Outcome: Adequate for Discharge   Problem: Elimination: Goal: Will not experience complications related to bowel motility Outcome: Adequate for Discharge Goal: Will not experience complications related to urinary retention Outcome: Adequate for Discharge   Problem:  Pain Managment: Goal: General experience of comfort will improve Outcome: Adequate for Discharge   Problem: Safety: Goal: Ability to remain free from injury will improve Outcome: Adequate for Discharge   Problem: Skin Integrity: Goal: Risk for impaired skin integrity will decrease Outcome: Adequate for Discharge   Problem: Education: Goal: Knowledge of disease or condition will improve Outcome: Adequate for Discharge Goal: Knowledge of secondary prevention will improve Outcome: Adequate for Discharge  Goal: Knowledge of patient specific risk factors addressed and post discharge goals established will improve Outcome: Adequate for Discharge Goal: Individualized Educational Video(s) Outcome: Adequate for Discharge   Problem: Acute Rehab OT Goals (only OT should resolve) Goal: OT Additional ADL Goal #3 Outcome: Adequate for Discharge

## 2021-04-13 NOTE — Discharge Summary (Signed)
Physician Discharge Summary  Chad Coleman KZS:[id number] DOB: 1961/06/07 DOA: 02/21/2021  PCP: Patient, No Pcp Per (Inactive)  Admit date: 02/21/2021 Discharge date: 04/13/2021  Time spent: 35 minutes  Recommendations for Outpatient Follow-up:  1. Will need to continue PT/OT/SLP services at nursing facility 2. He will discharge to [facility] SNF 3. Patient needs to schedule an appointment with [facility] within 4 weeks after discharge to facility.  An ambulatory referral for follow-up will be sent via epic.   Discharge Diagnoses:  Active Problems:   Intraparenchymal hemorrhage of brain (HCC)   Hypertension   Tobacco abuse   Hyponatremia   Leukocytosis   Controlled type 2 diabetes mellitus with hyperglycemia, without long-term current use of insulin (HCC)   Brain injury with brief loss of consciousness (HCC)   Acute pain of right shoulder   URI with cough and congestion   Fever, unspecified   COVID-19 virus infection    Discharge Condition: Stable  Diet recommendation: Regular  Filed Weights   02/25/21 1357  Weight: 73.2 kg    History of present illness:  60 year old gentleman prior history of hypertension, cocaine use, tobacco abuse admitted on 02/21/2021 with a code stroke was found to have intracranial hemorrhage in the right basal ganglia secondary to hypertension and cocaine use. Patient was transferred from neurology service to hospitalist service on 02/26/2021.  Pt had a fall on 03/03/21, he underwentCT head showing increased size of the intraparenchymal hematoma with worsening leftward midline shift. He was transferred to ICU overnight, neuro surgery consulted and a repeat CT head ordered which was stable . Pt had another fall in the room on 03/05/21, repeat CT head without contrast showedunchanged appearance of right basal ganglia intraparenchymal hematoma with 7 mm of leftward midline shift.  Since admission patient has done well  w/recommendation from PT and OT for SNF.  Patient stable for discharge but was found to be COVID-positive as of 4/27.  He was mildly symptomatic with fever and cough.  He has completed 3 days of remdesivir and quarantine was complete as of 5/7.  Please refer to note dictated on 4/27 for expanded details regarding hospitalization.  Hospital Course:  Acute problems: Mild COVID 19 infection without pneumonia Has completed 10 days of isolation from the day of diagnosis given he did not have a severe case of COVID and did not have pneumonia.  Postinfection fatigue and anorexia resolved  Hyponatremia secondary to dehydration Sodium has normalized to 135 after administration of short-term IV fluids   Intracranial hemorrhage with associated brain injury Zoloft 50 mg daily for brain injury related impulsivity Cognitive eval per OT/SLP with documented deficits  Proteus UTI/pansensitive Has completed 3 days of IV Rocephin  Dysphagia Continue regular diet with thin liquids  Physical Deconditioning -Secondary to recent stroke -PT/OT recommend SNF for rehabilitative therapy -Continue PRAFO shoe   Hypertension Continue carvedilol, Norvasc and hydralazine  Hyperlipidemia Continue statin.  Type 2 diabetes mellitus controlled with hyperglycemia Hemoglobin A1 c is 6.4.  Metformin and SSI     Other problems: Hypokalemia and hyponatremia Resolved  Mild elevation of liver enzymes.  Resolved Unremarkable abdominal ultrasound   Procedures:  Echocardiogram   Consultations:  Neurology  Neurosurgery   Discharge Exam: Vitals:   04/12/21 2308 04/13/21 0353  BP: 129/85 117/77  Pulse: 68 63  Resp: 18 18  Temp: 98.4 F (36.9 C) 98.2 F (36.8 C)  SpO2: 96% 98%   Constitutional: Alert, calm, no acute distress Respiratory:  Lungs are clear and he  remained stable on room air Cardiovascular:  Heart sounds S1 S2, regular pulse tachycardia, no peripheral edema.  Skin  warm and dry Abdomen:   LBM 5/16, soft nontender nondistended.  Improved intake Neurologic: Cranial nerves are intact, persistent dense left hemiplegia and unable to ambulate. Strength on right WNL Psychiatric alert and oriented x3.    Pleasant affect    Discharge Instructions   Discharge Instructions    Ambulatory referral to Neurology   Complete by: As directed    Follow up with stroke clinic NP (Jessica Vanschaick or Cecille Rubin, if both not available, consider Zachery Dauer, or Ahern) at [facility] in about 4 weeks. Thanks.     Allergies as of 04/13/2021   No Known Allergies     Medication List    STOP taking these medications   hydrochlorothiazide 25 MG tablet Commonly known as: HYDRODIURIL     TAKE these medications   acetaminophen 325 MG tablet Commonly known as: TYLENOL Take 2 tablets (650 mg total) by mouth every 4 (four) hours as needed for mild pain (or temp > 37.5 C (99.5 F)).   alum & mag hydroxide-simeth 200-200-20 MG/5ML suspension Commonly known as: MAALOX/MYLANTA Take 30 mLs by mouth every 6 (six) hours as needed for indigestion or heartburn.   amLODipine 10 MG tablet Commonly known as: NORVASC Take 1 tablet (10 mg total) by mouth daily. What changed:   medication strength  how much to take  Another medication with the same name was removed. Continue taking this medication, and follow the directions you see here.   atorvastatin 10 MG tablet Commonly known as: LIPITOR Take 2 tablets (20 mg total) by mouth daily.   carvedilol 6.25 MG tablet Commonly known as: COREG Take 1 tablet (6.25 mg total) by mouth 2 (two) times daily with a meal.   diclofenac Sodium 1 % Gel Commonly known as: VOLTAREN Apply 4 g topically 4 (four) times daily.   feeding supplement Liqd Take 237 mLs by mouth 3 (three) times daily between meals.   hydrALAZINE 50 MG tablet Commonly known as: APRESOLINE Take 1 tablet (50 mg total) by mouth every 8 (eight) hours.    hydroxypropyl methylcellulose / hypromellose 2.5 % ophthalmic solution Commonly known as: ISOPTO TEARS / GONIOVISC Place 2 drops into both eyes as needed for dry eyes.   insulin aspart 100 UNIT/ML injection Commonly known as: novoLOG Inject 0-5 Units into the skin at bedtime. Correction coverage: HS scale  CBG < 70: Implement Hypoglycemia Standing Orders and refer to Hypoglycemia Standing Orders sidebar report  CBG 70 - 120: 0 units  CBG 121 - 150: 0 units  CBG 151 - 200: 0 units  CBG 201 - 250: 2 units  CBG 251 - 300: 3 units  CBG 301 - 350: 4 units  CBG 351 - 400: 5 units  CBG > 400 call MD and obtain STAT lab verification   insulin aspart 100 UNIT/ML injection Commonly known as: novoLOG Inject 0-9 Units into the skin 3 (three) times daily with meals. Correction coverage: Sensitive (thin, NPO, renal)  CBG < 70: Implement Hypoglycemia Standing Orders and refer to Hypoglycemia Standing Orders sidebar report  CBG 70 - 120: 0 units  CBG 121 - 150: 1 unit  CBG 151 - 200: 2 units  CBG 201 - 250: 3 units  CBG 251 - 300: 5 units  CBG 301 - 350: 7 units  CBG 351 - 400 9 units  CBG > 400 call MD and obtain STAT  lab verification   melatonin 5 MG Tabs Take 1 tablet (5 mg total) by mouth at bedtime as needed.   metFORMIN 500 MG tablet Commonly known as: GLUCOPHAGE Take 1 tablet (500 mg total) by mouth daily with breakfast.   multivitamin with minerals Tabs tablet Take 1 tablet by mouth daily.   pantoprazole 40 MG tablet Commonly known as: PROTONIX Take 1 tablet (40 mg total) by mouth 2 (two) times daily.   polyethylene glycol 17 g packet Commonly known as: MIRALAX / GLYCOLAX Take 17 g by mouth daily.   senna-docusate 8.6-50 MG tablet Commonly known as: Senokot-S Take 1 tablet by mouth 2 (two) times daily.   sertraline 50 MG tablet Commonly known as: ZOLOFT Take 1 tablet (50 mg total) by mouth daily.   valproic acid 250 MG capsule Commonly known as: DEPAKENE Take 1  capsule (250 mg total) by mouth 2 (two) times daily.      No Known Allergies  Contact information for follow-up providers    [facility]. Schedule an appointment as soon as possible for a visit in 4 week(s).   Specialty: Neurology Contact information: [address] [place] [phone]           Contact information for after-discharge care    Destination    [facility] .   Service: Skilled Nursing Contact information: [address] [place] [phone]                   The results of significant diagnostics from this hospitalization (including imaging, microbiology, ancillary and laboratory) are listed below for reference.    Significant Diagnostic Studies: DG Chest Port 1 View  Result Date: 03/23/2021 CLINICAL DATA:  Upper respiratory infection with cough and congestion EXAM: PORTABLE CHEST 1 VIEW COMPARISON:  None. FINDINGS: Patient is rotated. The heart size and mediastinal contours are within normal limits for technique. Both lungs are clear. No pleural effusion or pneumothorax. The visualized skeletal structures are unremarkable. Surgical clips overlie the left axilla. IMPRESSION: No acute process in the chest. Electronically Signed   By: Macy Mis M.D.   On: 03/23/2021 16:04    Microbiology: No results found for this or any previous visit (from the past 240 hour(s)).   Labs: Basic Metabolic Panel: Recent Labs  Lab 04/10/21 0420  NA 137  K 4.1  CL 103  CO2 27  GLUCOSE 87  BUN 13  CREATININE 0.92  CALCIUM 9.1  MG 2.1   Liver Function Tests: No results for input(s): AST, ALT, ALKPHOS, BILITOT, PROT, ALBUMIN in the last 168 hours. No results for input(s): LIPASE, AMYLASE in the last 168 hours. No results for input(s): AMMONIA in the last 168 hours. CBC: Recent Labs  Lab 04/10/21 0420  WBC 6.0  HGB 12.0*  HCT 37.6*  MCV 89.5  PLT 288    Cardiac Enzymes: No results for input(s): CKTOTAL, CKMB, CKMBINDEX, TROPONINI in the last 168 hours. BNP: BNP (last 3 results) No results for input(s): BNP in the last 8760 hours.  ProBNP (last 3 results) No results for input(s): PROBNP in the last 8760 hours.  CBG: Recent Labs  Lab 04/12/21 0629 04/12/21 1102 04/12/21 1648 04/12/21 2152 04/13/21 0613  GLUCAP 88 117* 88 110* 103*       Signed:  Erin Hearing ANP [facility] 04/13/2021, 8:17 AM

## 2021-04-13 NOTE — Progress Notes (Signed)
Patient medically stable for discharge per MD order. RN attempted to call report multiple times over 30 min, [facility] line busy. Will continue to call.  IV removed. Belongings: glasses. All patient's questions answered.

## 2021-04-13 NOTE — Progress Notes (Addendum)
1pm: Patient will go to [facility], he will be transported via [facility] has been scheduled for first available. The number to call for report is [phone].  Patient's RN aware of discharge plan.  11am: CSW spoke with Tressa Busman at [facility] who states there was a resident who tested positive at the facility this morning and the DON has a meeting with corporate at 12 to determine if he can proceed with admitting this patient to the facility today.  Madilyn Fireman, MSW, LCSW Transitions of Care  Clinical Social Worker II [phone]

## 2021-04-13 NOTE — Progress Notes (Signed)
Physical Therapy Treatment Patient Details Name: Chad Coleman MRN: [id number] DOB: 17-May-1961 Today's Date: 04/13/2021    History of Present Illness 60 yo male presents to [facility] on 3/28 with acute onset of L weakness, numbness, and L facial droop. CTH R BG ICH, suspect hypertensive.  Fall on 02/28/21. imaging showing increased in R intraparenchymal hematoma with worsening leftward midline shift on 4/7. Tested postive for COVID on 4/27, off precautions 5/7. PMH includes hypertension, tobacco use, noncompliance with antihypertensive medications, and cocaine abuse.    PT Comments    Pt admitted with above diagnosis. Pt was able to work on weight shifting in standing and stepping.  Pre gait activities with pt exhibiting fatigue rather quickly.  Pt was able to stand pivot with left LE blocked with mod assist of 2 persons to assist.  Pt needs SNF and is very motivated.  Will continue to follow acutely. Pt currently with functional limitations due to the deficits listed below (see PT Problem List). Pt will benefit from skilled PT to increase their independence and safety with mobility to allow discharge to the venue listed below.    Follow Up Recommendations  Supervision/Assistance - 24 hour;SNF     Equipment Recommendations  Wheelchair (measurements PT);Wheelchair cushion (measurements PT)    Recommendations for Other Services       Precautions / Restrictions Precautions Precautions: Fall Precaution Comments: Pt with 2 in hospital falls.  mild L inattention, impulsive Restrictions Weight Bearing Restrictions: No    Mobility  Bed Mobility Overal bed mobility: Needs Assistance Bed Mobility: Supine to Sit Rolling: Min assist Sidelying to sit: Mod assist Supine to sit: Min assist Sit to supine: Mod assist   General bed mobility comments: pt needs vc's for using RLE to assist L and allowed to use rail and HOB elevated but with increased time was able to come to edge with min A to scoot L  hip fwd.    Transfers Overall transfer level: Needs assistance Equipment used: 2 person hand held assist Transfers: Sit to/from [facility] Sit to Stand: Mod assist;Min assist;+2 physical assistance Stand pivot transfers: Mod assist;+2 physical assistance       General transfer comment: mod A +2 for sit>stand from bed and recliner with stability control needed at L knee and facilitation of WB'ing through LUE with sit>stand.  Assist to weight shift as well during transfer as well as cues.  Ambulation/Gait             General Gait Details: pre-gait tasks only: weight shifting L and R with L knee blocked, working on maintaining midline with dynamic activity   Stairs             Wheelchair Mobility    Modified Rankin (Stroke Patients Only) Modified Rankin (Stroke Patients Only) Pre-Morbid Rankin Score: No symptoms Modified Rankin: Severe disability     Balance Overall balance assessment: Needs assistance Sitting-balance support: Feet supported;Single extremity supported Sitting balance-Leahy Scale: Poor Sitting balance - Comments: cues for maintaining midline Postural control: Left lateral lean Standing balance support: Bilateral upper extremity supported Standing balance-Leahy Scale: Poor Standing balance comment: requires external assist of 2 persons to maintain standing               High Level Balance Comments: worked on RUE reaching in sitting, when pt reaching to R able to maintain sitting with min A.            Cognition Arousal/Alertness: Awake/alert Behavior During Therapy: WFL for tasks assessed/performed  Overall Cognitive Status: Impaired/Different from baseline Area of Impairment: Attention;Following commands;Safety/judgement;Problem solving;Awareness                   Current Attention Level: Selective Memory: Decreased short-term memory Following Commands: Follows one step commands consistently;Follows multi-step  commands inconsistently Safety/Judgement: Decreased awareness of safety Awareness: Emergent Problem Solving: Requires verbal cues;Difficulty sequencing General Comments: pt following commands with increased time, difficulty following mulitple step commands and attending to tasks.  continues to L neglect.  limited verbalizations.      Exercises General Exercises - Lower Extremity Long Arc Quad: PROM;Left;10 reps;Seated Low Level/ICU Exercises Stabilized Bridging: AROM;Both;10 reps;Supine Other Exercises Other Exercises: Seated tasks:  EOB sitting x10 minutes, faciliation of L UE/scapula for increased mobility for elevation/depression, retraction/protraction given humerus support.    General Comments        Pertinent Vitals/Pain Pain Assessment: No/denies pain    Home Living                      Prior Function            PT Goals (current goals can now be found in the care plan section) Acute Rehab PT Goals Patient Stated Goal: to go home Progress towards PT goals: Progressing toward goals    Frequency    Min 3X/week      PT Plan Current plan remains appropriate    Co-evaluation              AM-PAC PT "6 Clicks" Mobility   Outcome Measure  Help needed turning from your back to your side while in a flat bed without using bedrails?: A Lot Help needed moving from lying on your back to sitting on the side of a flat bed without using bedrails?: A Lot Help needed moving to and from a bed to a chair (including a wheelchair)?: Total Help needed standing up from a chair using your arms (e.g., wheelchair or bedside chair)?: Total Help needed to walk in hospital room?: Total Help needed climbing 3-5 steps with a railing? : Total 6 Click Score: 8    End of Session Equipment Utilized During Treatment: Gait belt Activity Tolerance: Patient tolerated treatment well Patient left: in chair;with call bell/phone within reach;with chair alarm set Nurse  Communication: Mobility status PT Visit Diagnosis: Hemiplegia and hemiparesis;Unsteadiness on feet (R26.81);Muscle weakness (generalized) (M62.81);Difficulty in walking, not elsewhere classified (R26.2);Other symptoms and signs involving the nervous system (R29.898) Hemiplegia - Right/Left: Left Hemiplegia - dominant/non-dominant: Dominant Hemiplegia - caused by: Nontraumatic intracerebral hemorrhage     Time: 1040-1056 PT Time Calculation (min) (ACUTE ONLY): 16 min  Charges:  $Therapeutic Activity: 8-22 mins                     Chad Coleman M,PT Acute Rehab Services [phone] (pager)   Alvira Philips 04/13/2021, 12:31 PM

## 2021-04-13 NOTE — TOC Transition Note (Signed)
Transition of Care [facility]) - CM/SW Discharge Note   Patient Details  Name: Nasir Bright MRN: [id number] Date of Birth: 1961-04-14  Transition of Care [facility]) CM/SW Contact:  Curlene Labrum, RN Phone Number: 04/13/2021, 8:32 AM   Clinical Narrative:    Case management spoke with the patient's sister, [name], this morning to communicate to her that the patient would be transferred to [facility]F this afternoon for admission.  The patient's sister completed admission paperwork with CM at the facility yesterday and is aware that the patient would transfer to the facility via [facility] / ambulance.  CM and MSW to follow for transfer to [facility] today.     Barriers to Discharge: Continued Medical Work up,Inadequate or no insurance,Active Substance Use - Placement   Patient Goals and CMS Choice Patient states their goals for this hospitalization and ongoing recovery are:: get better CMS Medicare.gov Compare Post Acute Care list provided to:: Patient Represenative (must comment) [name], sister located in [place], [place]) Choice offered to / list presented to : [facility] POA / Guardian (sister, [name], sister)  Discharge Placement                       Discharge Plan and Services In-house Referral: Clinical Social Work,Chaplain Discharge Planning Services: CM Consult Post Acute Care Choice: [place]                               Social Determinants of Health (SDOH) Interventions     Readmission Risk Interventions No flowsheet data found.

## 2021-05-12 ENCOUNTER — Inpatient Hospital Stay: Payer: Self-pay | Admitting: Adult Health

## 2021-05-16 ENCOUNTER — Ambulatory Visit (INDEPENDENT_AMBULATORY_CARE_PROVIDER_SITE_OTHER): Payer: Self-pay | Admitting: Adult Health

## 2021-05-16 ENCOUNTER — Other Ambulatory Visit: Payer: Self-pay

## 2021-05-16 ENCOUNTER — Encounter: Payer: Self-pay | Admitting: Adult Health

## 2021-05-16 VITALS — BP 129/84 | HR 59

## 2021-05-16 DIAGNOSIS — I10 Essential (primary) hypertension: Secondary | ICD-10-CM

## 2021-05-16 DIAGNOSIS — I610 Nontraumatic intracerebral hemorrhage in hemisphere, subcortical: Secondary | ICD-10-CM

## 2021-05-16 DIAGNOSIS — E785 Hyperlipidemia, unspecified: Secondary | ICD-10-CM

## 2021-05-16 DIAGNOSIS — F1910 Other psychoactive substance abuse, uncomplicated: Secondary | ICD-10-CM

## 2021-05-16 DIAGNOSIS — G8194 Hemiplegia, unspecified affecting left nondominant side: Secondary | ICD-10-CM

## 2021-05-16 NOTE — Patient Instructions (Signed)
Continue to work with therapies for hopeful further recovery  Continue atorvastatin  for secondary stroke prevention  Continue to follow up with PCP regarding cholesterol and blood pressure management  Maintain strict control of hypertension with blood pressure goal below 130/90 and cholesterol with LDL cholesterol (bad cholesterol) goal below 70 mg/dL.       Followup in the future with me in 3 months or call earlier if needed       Thank you for coming to see [place] at [facility]. I hope we have been able to provide you high quality care today.  You may receive a patient satisfaction survey over the next few weeks. We would appreciate your feedback and comments so that we may continue to improve ourselves and the health of our patients.

## 2021-05-16 NOTE — Progress Notes (Signed)
[facility] [address] [place] [address] [phone]       HOSPITAL FOLLOW UP NOTE  Chad Coleman Date of Birth:  August 01, 1961 Medical Record Number:  [id number]   Reason for Referral:  hospital stroke follow up    SUBJECTIVE:   CHIEF COMPLAINT:  Chief Complaint  Patient presents with   Follow-up    Corner rm with caregiver catherine  Pt is well, just having L sided weakness/immobility     HPI:   Chad Coleman is a 60 y.o. male with history of hypertension, tobacco use, noncompliance with antihypertensive medications, and cocaine abuse who presented on 02/21/2021 with Left sided weakness and left facial droop.  Stroke work-up revealed right BG ICH secondary to uncontrolled hypertension and cocaine use.  2D echo EF of 65 to 70%.  Hypertensive emergency treated with Cleviprex stabilized during admission and resumed amlodipine and hydrochlorothiazide.  LDL 52 and initiate atorvastatin 20 mg daily.  UDS positive for cocaine.  Current tobacco use.  Residual deficits of dysarthria, right gaze preference and incomplete left gaze, left facial weakness and left hemiplegia. He was discharged to [facility] for ongoing therapy.   Today, 05/16/2021, Chad Coleman is being seen for hospital follow-up accompanied by SNF caregiver -continues to reside at [facility].  He reports he has been doing well since discharge currently working with therapies for residual left hemiplegia and dysarthria.  Remains nonambulatory - able to stand/pivot for transfers.  He is very motivated for hopeful recovery.  Denies new stroke/TIA symptoms.  Remains on atorvastatin without associated side effects.  Blood pressure today 129/84.  No further concerns at this time.     ROS:   14 system review of systems performed and negative with exception of those listed in HPI  PMH:  Past Medical History:  Diagnosis Date   Hypertension     PSH: History reviewed. No  pertinent surgical history.  Social History:  Social History   Socioeconomic History   Marital status: Single    Spouse name: Not on file   Number of children: Not on file   Years of education: Not on file   Highest education level: Not on file  Occupational History   Not on file  Tobacco Use   Smoking status: Every Day    Pack years: 0.00   Smokeless tobacco: Never  Substance and Sexual Activity   Alcohol use: Not on file   Drug use: Not on file   Sexual activity: Not on file  Other Topics Concern   Not on file  Social History Narrative   Not on file   Social Determinants of Health   Financial Resource Strain: Not on file  Food Insecurity: Not on file  Transportation Needs: Not on file  Physical Activity: Not on file  Stress: Not on file  Social Connections: Not on file  Intimate Partner Violence: Not on file    Family History: History reviewed. No pertinent family history.  Medications:   Current Outpatient Medications on File Prior to Visit  Medication Sig Dispense Refill   acetaminophen (TYLENOL) 325 MG tablet Take 2 tablets (650 mg total) by mouth every 4 (four) hours as needed for mild pain (or temp > 37.5 C (99.5 F)).     alum & mag hydroxide-simeth (MAALOX/MYLANTA) 200-200-20 MG/5ML suspension Take 30 mLs by mouth every 6 (six) hours as needed for indigestion or heartburn. 355 mL 0   amLODipine (NORVASC) 10 MG tablet Take 1 tablet (10  mg total) by mouth daily.     atorvastatin (LIPITOR) 10 MG tablet Take 2 tablets (20 mg total) by mouth daily.     carvedilol (COREG) 6.25 MG tablet Take 1 tablet (6.25 mg total) by mouth 2 (two) times daily with a meal.     feeding supplement (ENSURE ENLIVE / ENSURE PLUS) LIQD Take 237 mLs by mouth 3 (three) times daily between meals. 237 mL 12   hydrALAZINE (APRESOLINE) 50 MG tablet Take 1 tablet (50 mg total) by mouth every 8 (eight) hours.     hydroxypropyl methylcellulose / hypromellose (ISOPTO TEARS / GONIOVISC) 2.5 %  ophthalmic solution Place 2 drops into both eyes as needed for dry eyes. 15 mL 12   insulin aspart (NOVOLOG) 100 UNIT/ML injection Inject 0-5 Units into the skin at bedtime. Correction coverage: HS scale  CBG < 70: Implement Hypoglycemia Standing Orders and refer to Hypoglycemia Standing Orders sidebar report  CBG 70 - 120: 0 units  CBG 121 - 150: 0 units  CBG 151 - 200: 0 units  CBG 201 - 250: 2 units  CBG 251 - 300: 3 units  CBG 301 - 350: 4 units  CBG 351 - 400: 5 units  CBG > 400 call MD and obtain STAT lab verification 10 mL 11   insulin aspart (NOVOLOG) 100 UNIT/ML injection Inject 0-9 Units into the skin 3 (three) times daily with meals. Correction coverage: Sensitive (thin, NPO, renal)  CBG < 70: Implement Hypoglycemia Standing Orders and refer to Hypoglycemia Standing Orders sidebar report  CBG 70 - 120: 0 units  CBG 121 - 150: 1 unit  CBG 151 - 200: 2 units  CBG 201 - 250: 3 units  CBG 251 - 300: 5 units  CBG 301 - 350: 7 units  CBG 351 - 400 9 units  CBG > 400 call MD and obtain STAT lab verification 10 mL 11   melatonin 5 MG TABS Take 1 tablet (5 mg total) by mouth at bedtime as needed.  0   metFORMIN (GLUCOPHAGE) 500 MG tablet Take 1 tablet (500 mg total) by mouth daily with breakfast.     pantoprazole (PROTONIX) 40 MG tablet Take 1 tablet (40 mg total) by mouth 2 (two) times daily.     polyethylene glycol (MIRALAX / GLYCOLAX) 17 g packet Take 17 g by mouth daily. 14 each 0   senna-docusate (SENOKOT-S) 8.6-50 MG tablet Take 1 tablet by mouth 2 (two) times daily.     sertraline (ZOLOFT) 50 MG tablet Take 1 tablet (50 mg total) by mouth daily.     valproic acid (DEPAKENE) 250 MG capsule Take 1 capsule (250 mg total) by mouth 2 (two) times daily.     diclofenac Sodium (VOLTAREN) 1 % GEL Apply 4 g topically 4 (four) times daily. (Patient not taking: Reported on 05/16/2021)     Multiple Vitamin (MULTIVITAMIN WITH MINERALS) TABS tablet Take 1 tablet by mouth daily. (Patient not  taking: Reported on 05/16/2021)     No current facility-administered medications on file prior to visit.    Allergies:  No Known Allergies    OBJECTIVE:  Physical Exam  Vitals:   05/16/21 1433  BP: 129/84  Pulse: (!) 59   There is no height or weight on file to calculate BMI. No results found.  Post stroke PHQ 2/9 Depression screen PHQ 2/9 05/16/2021  Decreased Interest 0  Down, Depressed, Hopeless 0  PHQ - 2 Score 0     General: Frail very  pleasant elderly appearing African-American male, seated in w/c, in no evident distress Head: head normocephalic and atraumatic.   Neck: supple with no carotid or supraclavicular bruits Cardiovascular: regular rate and rhythm, no murmurs Musculoskeletal: no deformity Skin:  no rash/petichiae Vascular:  Normal pulses all extremities   Neurologic Exam Mental Status: Awake and fully alert. mild to moderate dysarthria. Oriented to place and time. Recent and remote memory intact. Attention span, concentration and fund of knowledge appropriate during visit. Mood and affect appropriate.  Cranial Nerves: Fundoscopic exam reveals sharp disc margins. Pupils equal, briskly reactive to light. Extraocular movements full without nystagmus. Visual fields full to confrontation. Hearing intact. Facial sensation intact.  Left lower facial weakness.  Tongue and palate moves normally and symmetrically.  Motor: Normal strength, bulk and tone right upper and lower extremity. Dense left hemiplegia with slight movement and shoulder and hip Sensory.: intact to touch , pinprick , position and vibratory sensation in BLE although moderately diminished sensory LUE  Coordination: Rapid alternating movements normal on right side. Finger-to-nose and heel-to-shin performed accurately on right side. Gait and Station: Deferred as patient nonambulatory Reflexes: 1+ and symmetric. Toes downgoing.     NIHSS  9 Modified Rankin  3-4      ASSESSMENT: Chad Coleman  is a 60 y.o. year old male presented with left-sided weakness and left facial droop on 02/21/2021 with stroke work-up revealing right BG ICH secondary to uncontrolled HTN and cocaine use. Vascular risk factors include HTN, tobacco use, medication noncompliance, cocaine use, HTN and HLD.      PLAN:  R BG ICH :  Residual deficit: Left hemiplegia and dysarthria -continue working with therapy at SNF.  Continue atorvastatin for secondary stroke prevention.   No indication at this time for antithrombotics in setting of recent ICH Discussed secondary stroke prevention measures and importance of close PCP follow up for aggressive stroke risk factor management  HTN: BP goal <130/90.  Stable on current regimen per PCP HLD: LDL goal <70. Recent LDL 52 -currently on atorvastatin 20 mg daily.  Polysubstance abuse: Complete abstinence currently and discussed importance of continued cessation if/when discharged from facility    Follow up in 3 months or call earlier if needed   CC:  [facility] provider: Dr. Leonie Man   I spent 53 minutes of face-to-face and non-face-to-face time with patient and caregiver.  This included previsit chart review including extensive review of recent hospitalization, lab review, study review, electronic health record documentation, and patient education and discussion regarding recent stroke including etiology, residual deficits and typical recovery time, secondary stroke prevention measures and importance of managing stroke risk factors as well as cessation of polysubstance use and answered all questions to patient satisfaction   Frann Rider, AGNP-BC  [facility] [address] [place], [place] [address]  Phone [phone] Fax [phone] Note: This document was prepared with digital dictation and possible smart phrase technology. Any transcriptional errors that result from this process are unintentional.

## 2021-05-27 NOTE — Progress Notes (Signed)
I agree with the above plan 

## 2021-08-18 ENCOUNTER — Encounter: Payer: Self-pay | Admitting: Adult Health

## 2021-08-18 ENCOUNTER — Ambulatory Visit (INDEPENDENT_AMBULATORY_CARE_PROVIDER_SITE_OTHER): Payer: Medicaid Other | Admitting: Adult Health

## 2021-08-18 VITALS — BP 109/77 | HR 77

## 2021-08-18 DIAGNOSIS — F1910 Other psychoactive substance abuse, uncomplicated: Secondary | ICD-10-CM

## 2021-08-18 DIAGNOSIS — I10 Essential (primary) hypertension: Secondary | ICD-10-CM

## 2021-08-18 DIAGNOSIS — E785 Hyperlipidemia, unspecified: Secondary | ICD-10-CM

## 2021-08-18 DIAGNOSIS — I610 Nontraumatic intracerebral hemorrhage in hemisphere, subcortical: Secondary | ICD-10-CM

## 2021-08-18 NOTE — Progress Notes (Signed)
I agree with the above plan 

## 2021-08-18 NOTE — Progress Notes (Signed)
[facility] [address] [place]. [place] [address] [phone]       STROKE FOLLOW UP NOTE  [name] Date of Birth:  1961/02/28 Medical Record Number:  [id number]   Reason for Referral: stroke follow up    SUBJECTIVE:   CHIEF COMPLAINT:  Chief Complaint  Patient presents with   Follow-up    RM 2 here for 3 month f/u- Pt reports he has been doing ok. Medicaid quit paying for physical therapy and has not been participating in the last few months. Feels like his motivation for life has decreased.     HPI:   [name] is a 60 y.o. male with history of hypertension, tobacco use, noncompliance with antihypertensive medications, and cocaine abuse who presented on 02/21/2021 with Left sided weakness and left facial droop.  Stroke work-up revealed right BG ICH secondary to uncontrolled hypertension and cocaine use.  2D echo EF of 65 to 70%.  Hypertensive emergency treated with Cleviprex stabilized during admission and resumed amlodipine and hydrochlorothiazide.  LDL 52 and initiate atorvastatin 20 mg daily.  UDS positive for cocaine.  Current tobacco use.  Residual deficits of dysarthria, right gaze preference and incomplete left gaze, left facial weakness and left hemiplegia. He was discharged to [facility] for ongoing therapy.   Initial visit 05/16/2021 JM: [name] is being seen for hospital follow-up accompanied by SNF caregiver -continues to reside at [facility].  He reports he has been doing well since discharge currently working with therapies for residual left hemiplegia and dysarthria.  Remains nonambulatory - able to stand/pivot for transfers.  He is very motivated for hopeful recovery.  Denies new stroke/TIA symptoms.  Remains on atorvastatin without associated side effects.  Blood pressure today 129/84.  No further concerns at this time.  Today, 08/18/2021, [name] returns for 28-month stroke follow-up unaccompanied.  He  continues to reside at [facility].  He was previously participating in therapies but apparently Medicaid stopped paying for additional therapy sessions a few months back.  Unfortunately, this has decreased his motivation for further possible recovery. He does continue to do exercises on his own in bed. He was starting to ambulate some with therapy but not currently ambulating.  Able to slide into wheelchair with assistance for transfers.  SNF did not provide MAR - per pt, no medication changes.  He endorses continued lab work at [facility]  Blood pressure today 109/77.  No further concerns at this     ROS:   14 system review of systems performed and negative with exception of those listed in HPI  PMH:  Past Medical History:  Diagnosis Date   Hypertension     PSH: No past surgical history on file.  Social History:  Social History   Socioeconomic History   Marital status: Single    Spouse name: Not on file   Number of children: Not on file   Years of education: Not on file   Highest education level: Not on file  Occupational History   Not on file  Tobacco Use   Smoking status: Every Day   Smokeless tobacco: Never  Substance and Sexual Activity   Alcohol use: Not on file   Drug use: Not on file   Sexual activity: Not on file  Other Topics Concern   Not on file  Social History Narrative   Not on file   Social Determinants of Health   Financial Resource Strain: Not on file  Food Insecurity: Not  on file  Transportation Needs: Not on file  Physical Activity: Not on file  Stress: Not on file  Social Connections: Not on file  Intimate Partner Violence: Not on file    Family History: No family history on file.  Medications:   Current Outpatient Medications on File Prior to Visit  Medication Sig Dispense Refill   acetaminophen (TYLENOL) 325 MG tablet Take 2 tablets (650 mg total) by mouth every 4 (four) hours as needed for mild pain (or temp > 37.5 C (99.5 F)).     alum &  mag hydroxide-simeth (MAALOX/MYLANTA) 200-200-20 MG/5ML suspension Take 30 mLs by mouth every 6 (six) hours as needed for indigestion or heartburn. 355 mL 0   amLODipine (NORVASC) 10 MG tablet Take 1 tablet (10 mg total) by mouth daily.     atorvastatin (LIPITOR) 10 MG tablet Take 2 tablets (20 mg total) by mouth daily.     carvedilol (COREG) 6.25 MG tablet Take 1 tablet (6.25 mg total) by mouth 2 (two) times daily with a meal.     diclofenac Sodium (VOLTAREN) 1 % GEL Apply 4 g topically 4 (four) times daily.     feeding supplement (ENSURE ENLIVE / ENSURE PLUS) LIQD Take 237 mLs by mouth 3 (three) times daily between meals. 237 mL 12   hydrALAZINE (APRESOLINE) 50 MG tablet Take 1 tablet (50 mg total) by mouth every 8 (eight) hours.     hydroxypropyl methylcellulose / hypromellose (ISOPTO TEARS / GONIOVISC) 2.5 % ophthalmic solution Place 2 drops into both eyes as needed for dry eyes. 15 mL 12   insulin aspart (NOVOLOG) 100 UNIT/ML injection Inject 0-5 Units into the skin at bedtime. Correction coverage: HS scale  CBG < 70: Implement Hypoglycemia Standing Orders and refer to Hypoglycemia Standing Orders sidebar report  CBG 70 - 120: 0 units  CBG 121 - 150: 0 units  CBG 151 - 200: 0 units  CBG 201 - 250: 2 units  CBG 251 - 300: 3 units  CBG 301 - 350: 4 units  CBG 351 - 400: 5 units  CBG > 400 call MD and obtain STAT lab verification 10 mL 11   insulin aspart (NOVOLOG) 100 UNIT/ML injection Inject 0-9 Units into the skin 3 (three) times daily with meals. Correction coverage: Sensitive (thin, NPO, renal)  CBG < 70: Implement Hypoglycemia Standing Orders and refer to Hypoglycemia Standing Orders sidebar report  CBG 70 - 120: 0 units  CBG 121 - 150: 1 unit  CBG 151 - 200: 2 units  CBG 201 - 250: 3 units  CBG 251 - 300: 5 units  CBG 301 - 350: 7 units  CBG 351 - 400 9 units  CBG > 400 call MD and obtain STAT lab verification 10 mL 11   melatonin 5 MG TABS Take 1 tablet (5 mg total) by mouth at  bedtime as needed.  0   metFORMIN (GLUCOPHAGE) 500 MG tablet Take 1 tablet (500 mg total) by mouth daily with breakfast.     Multiple Vitamin (MULTIVITAMIN WITH MINERALS) TABS tablet Take 1 tablet by mouth daily.     pantoprazole (PROTONIX) 40 MG tablet Take 1 tablet (40 mg total) by mouth 2 (two) times daily.     polyethylene glycol (MIRALAX / GLYCOLAX) 17 g packet Take 17 g by mouth daily. 14 each 0   senna-docusate (SENOKOT-S) 8.6-50 MG tablet Take 1 tablet by mouth 2 (two) times daily.     sertraline (ZOLOFT) 50 MG tablet Take  1 tablet (50 mg total) by mouth daily.     valproic acid (DEPAKENE) 250 MG capsule Take 1 capsule (250 mg total) by mouth 2 (two) times daily.     No current facility-administered medications on file prior to visit.    Allergies:  No Known Allergies    OBJECTIVE:  Physical Exam  Vitals:   08/18/21 1300  BP: 109/77  Pulse: 77   There is no height or weight on file to calculate BMI. No results found.   General: Frail very pleasant elderly appearing African-American male, seated in w/c, in no evident distress Head: head normocephalic and atraumatic.   Neck: supple with no carotid or supraclavicular bruits Cardiovascular: regular rate and rhythm, no murmurs Musculoskeletal: no deformity Skin:  no rash/petichiae Vascular:  Normal pulses all extremities   Neurologic Exam Mental Status: Awake and fully alert. mild to moderate dysarthria. Oriented to place and time. Recent and remote memory intact. Attention span, concentration and fund of knowledge appropriate during visit. Mood and affect appropriate.  Cranial Nerves: Pupils equal, briskly reactive to light. Extraocular movements full without nystagmus. Visual fields full to confrontation. Hearing intact. Facial sensation intact.  Left lower facial weakness.  Tongue and palate moves normally and symmetrically.  Motor: Normal strength, bulk and tone right upper and lower extremity. LUE: 2/5 deltoid, 3/5  elbow extension and flexion, 0/5 hand; LLE: 2/5 HF, KE and KF; 0/5 ADF Sensory.: intact to touch , pinprick , position and vibratory sensation  Coordination: Rapid alternating movements normal on right side. Finger-to-nose and heel-to-shin performed accurately on right side. Gait and Station: Deferred as patient nonambulatory Reflexes: 1+ and symmetric. Toes downgoing.         ASSESSMENT: Remus Hagedorn is a 60 y.o. year old male presented with left-sided weakness and left facial droop on 02/21/2021 with stroke work-up revealing right BG ICH secondary to uncontrolled HTN and cocaine use. Vascular risk factors include HTN, tobacco use, medication noncompliance, cocaine use, HTN and HLD.      PLAN:  R BG ICH :  Residual deficit: Left hemiparesis and dysarthria.  No additional therapy sessions per insurance.  Encouraged continued exercises as he has been making progress and hopefully you will restart therapy next year if needed Continue atorvastatin for secondary stroke prevention.   No indication at this time for antithrombotics in setting of recent ICH Discussed secondary stroke prevention measures and importance of close PCP follow up for aggressive stroke risk factor management  HTN: BP goal <130/90.  Stable today.  Monitor at facility HLD: LDL goal <70. Currently on atorvastatin 20 mg daily. Per pt, routinely monitored at facility Polysubstance abuse: Complete abstinence currently and discussed importance of continued cessation if/when discharged from facility    Follow up in 4 months or call earlier if needed   CC:  [facility] provider: Dr. Leonie Man   I spent 34 minutes of face-to-face and non-face-to-face time with patient.  This included previsit chart review, lab review, study review, electronic health record documentation, and patient education and discussion regarding prior stroke including etiology, residual deficits and potential further recovery, secondary stroke prevention  measures and importance of managing stroke risk factors as well as cessation of polysubstance use and answered all questions to patient satisfaction  Frann Rider, AGNP-BC  [facility] [address] [place], [place] [address]  Phone [phone] Fax [phone] Note: This document was prepared with digital dictation and possible smart phrase technology. Any transcriptional errors that result from this process are  unintentional.

## 2021-08-18 NOTE — Patient Instructions (Signed)
Continue  atorvastatin 20mg  daily  for secondary stroke prevention  Continue to follow up with PCP regarding cholesterol and blood pressure management  Maintain strict control of hypertension with blood pressure goal below 130/90 and cholesterol with LDL cholesterol (bad cholesterol) goal below 70 mg/dL.   No changes today. Has made some progress since prior visit 3 months ago! Hopefully will be able to restart therapies in the future. Please monitor for depression - feels less motivation since completion of therapies     Followup in the future with me in 4 months or call earlier if needed       Thank you for coming to see [place] at [facility]. I hope we have been able to provide you high quality care today.  You may receive a patient satisfaction survey over the next few weeks. We would appreciate your feedback and comments so that we may continue to improve ourselves and the health of our patients.

## 2021-08-22 DIAGNOSIS — Z0271 Encounter for disability determination: Secondary | ICD-10-CM

## 2021-12-21 ENCOUNTER — Ambulatory Visit: Payer: Medicaid Other | Admitting: Adult Health

## 2021-12-21 ENCOUNTER — Encounter: Payer: Self-pay | Admitting: Adult Health

## 2021-12-21 VITALS — BP 108/78 | HR 80

## 2021-12-21 DIAGNOSIS — G8194 Hemiplegia, unspecified affecting left nondominant side: Secondary | ICD-10-CM

## 2021-12-21 DIAGNOSIS — I610 Nontraumatic intracerebral hemorrhage in hemisphere, subcortical: Secondary | ICD-10-CM

## 2021-12-21 NOTE — Progress Notes (Signed)
[facility] [address] [place]. [place] [address] [phone]       STROKE FOLLOW UP NOTE  [name] Date of Birth:  [birth year] Medical Record Number:  [id number]   Reason for Referral: stroke follow up    SUBJECTIVE:   CHIEF COMPLAINT:  Chief Complaint  Patient presents with   Follow-up    RM 2  alone Pt is well, still having L sided weakness. no other stroke concerns.     HPI:   [name] is a 61 y.o. male with history of hypertension, tobacco use, noncompliance with antihypertensive medications, and cocaine abuse who presented on 02/21/2021 with Left sided weakness and left facial droop.  Stroke work-up revealed right BG ICH secondary to uncontrolled hypertension and cocaine use.  2D echo EF of 65 to 70%.  Hypertensive emergency treated with Cleviprex stabilized during admission and resumed amlodipine and hydrochlorothiazide.  LDL 52 and initiate atorvastatin 20 mg daily.  UDS positive for cocaine.  Current tobacco use.  Residual deficits of dysarthria, right gaze preference and incomplete left gaze, left facial weakness and left hemiplegia. He was discharged to [facility] for ongoing therapy.   Initial visit 05/16/2021 JM: [name] is being seen for hospital follow-up accompanied by SNF caregiver -continues to reside at [facility].  He reports he has been doing well since discharge currently working with therapies for residual left hemiplegia and dysarthria.  Remains nonambulatory - able to stand/pivot for transfers.  He is very motivated for hopeful recovery.  Denies new stroke/TIA symptoms.  Remains on atorvastatin without associated side effects.  Blood pressure today 129/84.  No further concerns at this time.  Lorenda Cahill 08/18/2021, [name] returns for 20-month stroke follow-up unaccompanied.  He continues to reside at [facility]  He was previously participating in therapies but apparently Medicaid stopped paying for  additional therapy sessions a few months back.  Unfortunately, this has decreased his motivation for further possible recovery. He does continue to do exercises on his own in bed. He was starting to ambulate some with therapy but not currently ambulating.  Able to slide into wheelchair with assistance for transfers.  SNF did not provide MAR - per pt, no medication changes.  He endorses continued lab work at [facility].  Blood pressure today 109/77.  No further concerns at this   Today, 12/21/2020, returns for 4 month stroke follow up unaccompanied. Resident of [facility]. Overall stable. Denies new stroke/TIA symptoms.  Residual left-sided weakness and dysarthria stable. Reports recently restarted working with PT.  Nonambulatory - able to stand/pivot for transfers. Per MAR review, compliant on aspirin and atorvastatin without side effects.  Blood pressure today 108/78. No new concerns at this time.        ROS:   14 system review of systems performed and negative with exception of those listed in HPI  PMH:  Past Medical History:  Diagnosis Date   Hypertension     PSH: History reviewed. No pertinent surgical history.  Social History:  Social History   Socioeconomic History   Marital status: Single    Spouse name: Not on file   Number of children: Not on file   Years of education: Not on file   Highest education level: Not on file  Occupational History   Not on file  Tobacco Use   Smoking status: Every Day   Smokeless tobacco: Never  Substance and Sexual Activity   Alcohol use: Not on file   Drug use:  Not on file   Sexual activity: Not on file  Other Topics Concern   Not on file  Social History Narrative   Not on file   Social Determinants of Health   Financial Resource Strain: Not on file  Food Insecurity: Not on file  Transportation Needs: Not on file  Physical Activity: Not on file  Stress: Not on file  Social Connections: Not on file  Intimate Partner Violence: Not on  file    Family History: History reviewed. No pertinent family history.  Medications:   Current Outpatient Medications on File Prior to Visit  Medication Sig Dispense Refill   acetaminophen (TYLENOL) 325 MG tablet Take 2 tablets (650 mg total) by mouth every 4 (four) hours as needed for mild pain (or temp > 37.5 C (99.5 F)).     alum & mag hydroxide-simeth (MAALOX/MYLANTA) 200-200-20 MG/5ML suspension Take 30 mLs by mouth every 6 (six) hours as needed for indigestion or heartburn. 355 mL 0   amLODipine (NORVASC) 10 MG tablet Take 1 tablet (10 mg total) by mouth daily.     atorvastatin (LIPITOR) 10 MG tablet Take 2 tablets (20 mg total) by mouth daily.     diclofenac Sodium (VOLTAREN) 1 % GEL Apply 4 g topically 4 (four) times daily.     feeding supplement (ENSURE ENLIVE / ENSURE PLUS) LIQD Take 237 mLs by mouth 3 (three) times daily between meals. 237 mL 12   hydrALAZINE (APRESOLINE) 50 MG tablet Take 1 tablet (50 mg total) by mouth every 8 (eight) hours.     hydroxypropyl methylcellulose / hypromellose (ISOPTO TEARS / GONIOVISC) 2.5 % ophthalmic solution Place 2 drops into both eyes as needed for dry eyes. 15 mL 12   insulin aspart (NOVOLOG) 100 UNIT/ML injection Inject 0-5 Units into the skin at bedtime. Correction coverage: HS scale  CBG < 70: Implement Hypoglycemia Standing Orders and refer to Hypoglycemia Standing Orders sidebar report  CBG 70 - 120: 0 units  CBG 121 - 150: 0 units  CBG 151 - 200: 0 units  CBG 201 - 250: 2 units  CBG 251 - 300: 3 units  CBG 301 - 350: 4 units  CBG 351 - 400: 5 units  CBG > 400 call MD and obtain STAT lab verification 10 mL 11   insulin aspart (NOVOLOG) 100 UNIT/ML injection Inject 0-9 Units into the skin 3 (three) times daily with meals. Correction coverage: Sensitive (thin, NPO, renal)  CBG < 70: Implement Hypoglycemia Standing Orders and refer to Hypoglycemia Standing Orders sidebar report  CBG 70 - 120: 0 units  CBG 121 - 150: 1 unit  CBG 151 -  200: 2 units  CBG 201 - 250: 3 units  CBG 251 - 300: 5 units  CBG 301 - 350: 7 units  CBG 351 - 400 9 units  CBG > 400 call MD and obtain STAT lab verification 10 mL 11   melatonin 5 MG TABS Take 1 tablet (5 mg total) by mouth at bedtime as needed.  0   metFORMIN (GLUCOPHAGE) 500 MG tablet Take 1 tablet (500 mg total) by mouth daily with breakfast.     Multiple Vitamin (MULTIVITAMIN WITH MINERALS) TABS tablet Take 1 tablet by mouth daily.     pantoprazole (PROTONIX) 40 MG tablet Take 1 tablet (40 mg total) by mouth 2 (two) times daily.     polyethylene glycol (MIRALAX / GLYCOLAX) 17 g packet Take 17 g by mouth daily. 14 each 0   senna-docusate (  SENOKOT-S) 8.6-50 MG tablet Take 1 tablet by mouth 2 (two) times daily.     sertraline (ZOLOFT) 50 MG tablet Take 1 tablet (50 mg total) by mouth daily.     valproic acid (DEPAKENE) 250 MG capsule Take 1 capsule (250 mg total) by mouth 2 (two) times daily.     carvedilol (COREG) 6.25 MG tablet Take 1 tablet (6.25 mg total) by mouth 2 (two) times daily with a meal. (Patient not taking: Reported on 12/21/2021)     No current facility-administered medications on file prior to visit.    Allergies:  No Known Allergies    OBJECTIVE:  Physical Exam  Vitals:   12/21/21 1435  BP: 108/78  Pulse: 80   There is no height or weight on file to calculate BMI. No results found.  General: Frail very pleasant elderly appearing African-American male, seated in w/c, in no evident distress Head: head normocephalic and atraumatic.   Neck: supple with no carotid or supraclavicular bruits Cardiovascular: regular rate and rhythm, no murmurs Musculoskeletal: no deformity Skin:  no rash/petichiae Vascular:  Normal pulses all extremities   Neurologic Exam Mental Status: Awake and fully alert. mild to moderate dysarthria. Oriented to place and time. Recent and remote memory intact. Attention span, concentration and fund of knowledge appropriate during visit.  Mood and affect appropriate.  Cranial Nerves: Pupils equal, briskly reactive to light. Extraocular movements full without nystagmus. Visual fields full to confrontation. Hearing intact. Facial sensation intact.  Left lower facial weakness.  Tongue and palate moves normally and symmetrically.  Motor: Normal strength, bulk and tone right upper and lower extremity. LUE: 2/5 deltoid, 3/5 elbow extension and flexion, 0/5 hand; LLE: 2/5 HF, KE and KF; 0/5 ADF Sensory.: intact to touch , pinprick , position and vibratory sensation  Coordination: Rapid alternating movements normal on right side. Finger-to-nose and heel-to-shin performed accurately on right side. Gait and Station: Deferred as patient nonambulatory Reflexes: 1+ and symmetric. Toes downgoing.         ASSESSMENT: Chad Coleman is a 61 y.o. year old male presented with left-sided weakness and left facial droop on 02/21/2021 with stroke work-up revealing right BG ICH secondary to uncontrolled HTN and cocaine use. Vascular risk factors include HTN, tobacco use, medication noncompliance, cocaine use, HTN and HLD.      PLAN:  R BG ICH :  Residual deficit: Left hemiparesis and dysarthria.  Recently restarted PT.  Stable since prior visit. Continue aspirin 81 mg daily and atorvastatin for secondary stroke prevention.   Discussed secondary stroke prevention measures and importance of close PCP follow up for aggressive stroke risk factor management  HTN: BP goal <130/90.  Stable today.  Monitor at facility HLD: LDL goal <70. Currently on atorvastatin 20 mg daily. Per pt, routinely monitored at facility Polysubstance abuse: Complete abstinence currently and discussed importance of continued cessation if/when discharged from facility   No further recommendations from stroke standpoint.  Follow-up as needed    I spent 26 minutes of face-to-face and non-face-to-face time with patient.  This included previsit chart review, lab review,  study review, electronic health record documentation, and patient education and discussion regarding prior stroke including etiology, residual deficits and potential further recovery, secondary stroke prevention measures and importance of managing stroke risk factors as well as cessation of polysubstance use and answered all questions to patient satisfaction  Frann Rider, [facility]  [facility] [address] [place], [place] [address]  Phone [phone] Fax [phone] Note: This document  was prepared with digital dictation and possible smart phrase technology. Any transcriptional errors that result from this process are unintentional.

## 2021-12-21 NOTE — Patient Instructions (Signed)
Continue working with PT  Continue aspirin 81 mg daily  and atorvastatin  for secondary stroke prevention  Continue to follow up with PCP regarding  cholesterol, blood pressure and diabetes management  Maintain strict control of hypertension with blood pressure goal below 130/90, diabetes with hemoglobin A1c goal below 7.0 % and cholesterol with LDL cholesterol (bad cholesterol) goal below 70 mg/dL.   Signs of a Stroke? Follow the BEFAST method:  Balance Watch for a sudden loss of balance, trouble with coordination or vertigo Eyes Is there a sudden loss of vision in one or both eyes? Or double vision?  Face: Ask the person to smile. Does one side of the face droop or is it numb?  Arms: Ask the person to raise both arms. Does one arm drift downward? Is there weakness or numbness of a leg? Speech: Ask the person to repeat a simple phrase. Does the speech sound slurred/strange? Is the person confused ? Time: If you observe any of these signs, call 911.    No further recommendations from stroke standpoint - follow up as needed     Thank you for coming to see [place] at [facility]. I hope we have been able to provide you high quality care today.  You may receive a patient satisfaction survey over the next few weeks. We would appreciate your feedback and comments so that we may continue to improve ourselves and the health of our patients.

## 2022-01-16 ENCOUNTER — Other Ambulatory Visit: Payer: Self-pay

## 2022-01-16 ENCOUNTER — Other Ambulatory Visit: Payer: Self-pay | Admitting: Family Medicine

## 2022-01-16 DIAGNOSIS — R221 Localized swelling, mass and lump, neck: Secondary | ICD-10-CM

## 2022-01-30 ENCOUNTER — Ambulatory Visit
Admission: RE | Admit: 2022-01-30 | Discharge: 2022-01-30 | Disposition: A | Discharge status: Home / Self Care | Payer: Medicaid Other | Source: Ambulatory Visit | Attending: Family Medicine | Admitting: Family Medicine

## 2022-01-30 DIAGNOSIS — R221 Localized swelling, mass and lump, neck: Secondary | ICD-10-CM

## 2022-01-30 MED ORDER — IOPAMIDOL (ISOVUE-300) INJECTION 61%
100.0000 mL | Freq: Once | INTRAVENOUS | Status: AC | PRN
  Administered 2022-01-30: 75 mL via INTRAVENOUS

## 2022-07-22 ENCOUNTER — Emergency Department (HOSPITAL_COMMUNITY)
Admission: EM | Admit: 2022-07-22 | Discharge: 2022-07-23 | Disposition: A | Discharge status: Skilled Nursing Facility | Payer: Medicaid Other | Attending: Emergency Medicine | Admitting: Emergency Medicine

## 2022-07-22 ENCOUNTER — Emergency Department (HOSPITAL_BASED_OUTPATIENT_CLINIC_OR_DEPARTMENT_OTHER): Payer: Medicaid Other

## 2022-07-22 ENCOUNTER — Emergency Department (HOSPITAL_COMMUNITY): Payer: Medicaid Other

## 2022-07-22 ENCOUNTER — Other Ambulatory Visit: Payer: Self-pay

## 2022-07-22 DIAGNOSIS — M79609 Pain in unspecified limb: Secondary | ICD-10-CM | POA: Diagnosis not present

## 2022-07-22 DIAGNOSIS — M7989 Other specified soft tissue disorders: Secondary | ICD-10-CM | POA: Diagnosis not present

## 2022-07-22 DIAGNOSIS — R7982 Elevated C-reactive protein (CRP): Secondary | ICD-10-CM | POA: Diagnosis not present

## 2022-07-22 DIAGNOSIS — Z79899 Other long term (current) drug therapy: Secondary | ICD-10-CM | POA: Insufficient documentation

## 2022-07-22 DIAGNOSIS — M79605 Pain in left leg: Secondary | ICD-10-CM | POA: Diagnosis not present

## 2022-07-22 DIAGNOSIS — R2242 Localized swelling, mass and lump, left lower limb: Secondary | ICD-10-CM | POA: Diagnosis present

## 2022-07-22 DIAGNOSIS — Z7982 Long term (current) use of aspirin: Secondary | ICD-10-CM | POA: Diagnosis not present

## 2022-07-22 LAB — BASIC METABOLIC PANEL
Anion gap: 10 (ref 5–15)
BUN: 20 mg/dL (ref 6–20)
CO2: 22 mmol/L (ref 22–32)
Calcium: 9.1 mg/dL (ref 8.9–10.3)
Chloride: 106 mmol/L (ref 98–111)
Creatinine, Ser: 1.13 mg/dL (ref 0.61–1.24)
GFR, Estimated: >60 mL/min (ref >60)
Glucose, Bld: 242 mg/dL — ABNORMAL HIGH (ref 70–99)
Potassium: 3.8 mmol/L (ref 3.5–5.1)
Sodium: 138 mmol/L (ref 135–145)

## 2022-07-22 LAB — CBC WITH DIFFERENTIAL/PLATELET
Abs Immature Granulocytes: 0.02 10*3/uL (ref 0.00–0.07)
Basophils Absolute: 0 10*3/uL (ref 0.0–0.1)
Basophils Relative: 1 %
Eosinophils Absolute: 0.1 10*3/uL (ref 0.0–0.5)
Eosinophils Relative: 2 %
HCT: 44.2 % (ref 39.0–52.0)
Hemoglobin: 14.5 g/dL (ref 13.0–17.0)
Immature Granulocytes: 0 %
Lymphocytes Relative: 17 %
Lymphs Abs: 1.1 10*3/uL (ref 0.7–4.0)
MCH: 29.8 pg (ref 26.0–34.0)
MCHC: 32.8 g/dL (ref 30.0–36.0)
MCV: 90.8 fL (ref 80.0–100.0)
Monocytes Absolute: 1 10*3/uL (ref 0.1–1.0)
Monocytes Relative: 15 %
Neutro Abs: 4.2 10*3/uL (ref 1.7–7.7)
Neutrophils Relative %: 65 %
Platelets: 326 10*3/uL (ref 150–400)
RBC: 4.87 MIL/uL (ref 4.22–5.81)
RDW: 13.3 % (ref 11.5–15.5)
WBC: 6.4 10*3/uL (ref 4.0–10.5)
nRBC: 0 % (ref 0.0–0.2)

## 2022-07-22 MED ORDER — ACETAMINOPHEN 325 MG PO TABS
650.0000 mg | ORAL_TABLET | Freq: Once | ORAL | Status: AC
  Administered 2022-07-22: 650 mg via ORAL
  Filled 2022-07-22: qty 2

## 2022-07-22 NOTE — ED Triage Notes (Signed)
Pt BIB EMS from [facility] for left leg swelling/pain that started about 3 weeks ago. Pt sent here for rule out DVT. Pt has left side weakness baseline from prior CVA, but has been having generalized weakness on his right side.

## 2022-07-22 NOTE — ED Provider Triage Note (Signed)
Emergency Medicine Provider Triage Evaluation Note  Chad Coleman , a 61 y.o. male  was evaluated in triage.  Pt complains of left lower extremity edema that is worsened over the past 3 weeks.  Patient sent here to rule out DVT.  No previous history of blood clots.  No injury.  Patient has baseline left-sided weakness from previous CVA.  Review of Systems  Positive: edema Negative: fever  Physical Exam  BP (!) 148/97   Pulse 95   Temp 98.7 F (37.1 C) (Oral)   Resp 18   Ht '5\' 9"'$  (1.753 m)   Wt 74.8 kg   SpO2 95%   BMI 24.37 kg/m  Gen:   Awake, no distress   Resp:  Normal effort  MSK:   Moves extremities without difficulty  Other:  Edema to LLE  Medical Decision Making  Medically screening exam initiated at 5:14 PM.  Appropriate orders placed.  Kaiyu Puig was informed that the remainder of the evaluation will be completed by another provider, this initial triage assessment does not replace that evaluation, and the importance of remaining in the ED until their evaluation is complete.  [place] to rule out DVT, labs   Suzy Bouchard, [place] 07/22/22 1715

## 2022-07-22 NOTE — Progress Notes (Signed)
VASCULAR LAB    Left lower extremity venous duplex has been performed.  See CV proc for preliminary results.   Carmyn Hamm, RVT 07/22/2022, 7:29 PM

## 2022-07-23 ENCOUNTER — Emergency Department (HOSPITAL_COMMUNITY): Payer: Medicaid Other

## 2022-07-23 ENCOUNTER — Encounter (HOSPITAL_COMMUNITY): Payer: Self-pay

## 2022-07-23 LAB — SEDIMENTATION RATE: Sed Rate: 7 mm/hr (ref 0–16)

## 2022-07-23 LAB — BRAIN NATRIURETIC PEPTIDE: B Natriuretic Peptide: 25.6 pg/mL (ref 0.0–100.0)

## 2022-07-23 LAB — C-REACTIVE PROTEIN: CRP: 1.5 mg/dL — ABNORMAL HIGH (ref <1.0)

## 2022-07-23 MED ORDER — IOHEXOL 350 MG/ML SOLN
75.0000 mL | Freq: Once | INTRAVENOUS | Status: AC | PRN
  Administered 2022-07-23: 75 mL via INTRAVENOUS

## 2022-07-23 MED ORDER — OXYCODONE HCL 5 MG PO TABS
5.0000 mg | ORAL_TABLET | Freq: Once | ORAL | Status: AC
  Administered 2022-07-23: 5 mg via ORAL
  Filled 2022-07-23: qty 1

## 2022-07-23 NOTE — ED Notes (Signed)
[facility] called  

## 2022-07-23 NOTE — Discharge Instructions (Signed)
You did not have any evidence of a DVT on your examination.  You should follow closely with the staff physician at your skilled nursing facility for further management and evaluation of your leg swelling.  Get help right away if you develop a fever or sudden onset chest pain and shortness of breath

## 2022-07-23 NOTE — ED Provider Notes (Signed)
[facility] DEPT Provider Note   CSN: [id number] Arrival date & time: 07/22/22  1655     History  Chief Complaint  Patient presents with   Leg Swelling    Chad Coleman is a 61 y.o. male who presents emergency department with a chief complaint of leg swelling.  Review of EMR shows that the patient has a history of left supraclavicular macrocystic lymphatic malformation.  He denies a history of cancer or COPD.  Patient has had 3 weeks of progressively worsening swelling and pain in the left leg with  HPI     Home Medications Prior to Admission medications   Medication Sig Start Date End Date Taking? Authorizing Provider  amLODipine (NORVASC) 10 MG tablet Take 1 tablet (10 mg total) by mouth daily. 04/13/21   Samella Parr, NP  aspirin EC 81 MG tablet Take 81 mg by mouth daily. Swallow whole.    [provider]  atorvastatin (LIPITOR) 10 MG tablet Take 2 tablets (20 mg total) by mouth daily. 04/13/21   Samella Parr, NP  divalproex (DEPAKOTE ER) 250 MG 24 hr tablet Take 250 mg by mouth daily.    [provider]  melatonin 5 MG TABS Take 1 tablet (5 mg total) by mouth at bedtime as needed. Patient taking differently: Take 3 mg by mouth at bedtime as needed. 04/12/21   Samella Parr, NP  metFORMIN (GLUCOPHAGE) 500 MG tablet Take 1 tablet (500 mg total) by mouth daily with breakfast. 04/13/21   Samella Parr, NP  Multiple Vitamin (MULTIVITAMIN WITH MINERALS) TABS tablet Take 1 tablet by mouth daily. 04/13/21   Samella Parr, NP  nicotine (NICODERM CQ - DOSED IN MG/24 HOURS) 14 mg/24hr patch Place 14 mg onto the skin daily.    [provider]  pantoprazole (PROTONIX) 40 MG tablet Take 1 tablet (40 mg total) by mouth 2 (two) times daily. 04/12/21   Samella Parr, NP  polyethylene glycol (MIRALAX / GLYCOLAX) 17 g packet Take 17 g by mouth daily. 04/13/21   Samella Parr, NP      Allergies    Patient has no known  allergies.    Review of Systems   Review of Systems  Physical Exam Updated Vital Signs BP (!) 155/109   Pulse 87   Temp 97.9 F (36.6 C) (Oral)   Resp 16   Ht '5\' 9"'$  (1.753 m)   Wt 74.8 kg   SpO2 99%   BMI 24.37 kg/m  Physical Exam Vitals and nursing note reviewed.  Constitutional:      General: He is not in acute distress.    Appearance: He is well-developed. He is not diaphoretic.  HENT:     Head: Normocephalic and atraumatic.  Eyes:     General: No scleral icterus.    Conjunctiva/sclera: Conjunctivae normal.  Cardiovascular:     Rate and Rhythm: Normal rate and regular rhythm.     Heart sounds: Normal heart sounds.  Pulmonary:     Effort: Pulmonary effort is normal. No respiratory distress.     Breath sounds: Normal breath sounds.  Abdominal:     Palpations: Abdomen is soft.     Tenderness: There is no abdominal tenderness.  Musculoskeletal:     Cervical back: Normal range of motion and neck supple.     Comments: Left leg with circumferential swelling DP pulse 2+  Skin:    General: Skin is warm and dry.  Neurological:  Mental Status: He is alert.  Psychiatric:        Behavior: Behavior normal.     ED Results / Procedures / Treatments   Labs (all labs ordered are listed, but only abnormal results are displayed) Labs Reviewed  BASIC METABOLIC PANEL - Abnormal; Notable for the following components:      Result Value   Glucose, Bld 242 (*)    All other components within normal limits  C-REACTIVE PROTEIN - Abnormal; Notable for the following components:   CRP 1.5 (*)    All other components within normal limits  CBC WITH DIFFERENTIAL/PLATELET  BRAIN NATRIURETIC PEPTIDE  SEDIMENTATION RATE    EKG EKG Interpretation  Date/Time:  Saturday July 22 2022 17:11:11 EDT Ventricular Rate:  93 PR Interval:  96 QRS Duration: 82 QT Interval:  337 QTC Calculation: 420 R Axis:   44 Text Interpretation: Sinus rhythm Short PR interval Confirmed by Dory Horn) on 07/22/2022 11:50:59 PM  Radiology VAS [place] LOWER EXTREMITY VENOUS (DVT) (7a-7p)  Result Date: 07/23/2022  Lower Venous DVT Study Patient Name:  ADAIN Gjerde  Date of Exam:   07/22/2022 Medical Rec #: [id number]         Accession #:    [id number] Date of Birth: [birth year]         Patient Gender: M Patient Age:   12 years Exam Location:  [facility] Procedure:      VAS [place] LOWER EXTREMITY VENOUS (DVT) Referring Phys: Charmaine Downs --------------------------------------------------------------------------------  Indications: Pain, and Swelling.  Limitations: Clothing,. Comparison Study: No prior study on file Performing Technologist: Sharion Dove RVS  Examination Guidelines: A complete evaluation includes B-mode imaging, spectral Doppler, color Doppler, and power Doppler as needed of all accessible portions of each vessel. Bilateral testing is considered an integral part of a complete examination. Limited examinations for reoccurring indications may be performed as noted. The reflux portion of the exam is performed with the patient in reverse Trendelenburg.  +-----+---------------+---------+-----------+----------+--------------+ RIGHTCompressibilityPhasicitySpontaneityPropertiesThrombus Aging +-----+---------------+---------+-----------+----------+--------------+ CFV  Full                                                        +-----+---------------+---------+-----------+----------+--------------+ SFJ  Full           Yes      Yes                                 +-----+---------------+---------+-----------+----------+--------------+   +---------+---------------+---------+-----------+----------+--------------+ LEFT     CompressibilityPhasicitySpontaneityPropertiesThrombus Aging +---------+---------------+---------+-----------+----------+--------------+ CFV      Full           Yes      Yes                                  +---------+---------------+---------+-----------+----------+--------------+ SFJ      Full                                                        +---------+---------------+---------+-----------+----------+--------------+ FV Prox  Full                                                        +---------+---------------+---------+-----------+----------+--------------+  FV Mid   Full                                                        +---------+---------------+---------+-----------+----------+--------------+ FV DistalFull                                                        +---------+---------------+---------+-----------+----------+--------------+ PFV      Full                                                        +---------+---------------+---------+-----------+----------+--------------+ POP      Full           Yes      Yes                                 +---------+---------------+---------+-----------+----------+--------------+ PTV      Full                                                        +---------+---------------+---------+-----------+----------+--------------+ PERO     Full                                                        +---------+---------------+---------+-----------+----------+--------------+ GSV      Full                                                        +---------+---------------+---------+-----------+----------+--------------+ SSV      Full                                                        +---------+---------------+---------+-----------+----------+--------------+     Summary: RIGHT: - No evidence of common femoral vein obstruction.  LEFT: - No evidence of deep vein thrombosis in the lower extremity. No indirect evidence of obstruction proximal to the inguinal ligament. - No cystic structure found in the popliteal fossa. - Ultrasound characteristics of enlarged lymph nodes noted in the groin.  *See table(s)  above for measurements and observations. Electronically signed by Harold Barban MD on 07/23/2022 at 8:49:37 PM.    Final    DG Foot 2 Views Left  Result Date: 07/23/2022 CLINICAL DATA:  Leg swelling EXAM: LEFT FOOT - 2 VIEW COMPARISON:  None Available. FINDINGS: Apparent  dislocation at the left 2nd MTP joint. Possible chronic appearing fracture at the base of the 2nd proximal phalanx. Old healed fractures in the 2nd through 4th distal metatarsals. Diffuse osteopenia. IMPRESSION: Apparent dislocation at the left 2nd MTP joint with possible chronic. Fracture at the base of the 2nd proximal phalanx. Old healed 2nd through 4th distal metatarsal fractures. Electronically Signed   By: Rolm Baptise M.D.   On: 07/23/2022 01:21   CT Angio Chest PE W and/or Wo Contrast  Result Date: 07/23/2022 CLINICAL DATA:  Syncope/presyncope, cerebrovascular cause suspected. Left leg swelling. Rule out PE. EXAM: CT ANGIOGRAPHY CHEST WITH CONTRAST TECHNIQUE: Multidetector CT imaging of the chest was performed using the standard protocol during bolus administration of intravenous contrast. Multiplanar CT image reconstructions and MIPs were obtained to evaluate the vascular anatomy. RADIATION DOSE REDUCTION: This exam was performed according to the departmental dose-optimization program which includes automated exposure control, adjustment of the mA and/or kV according to patient size and/or use of iterative reconstruction technique. CONTRAST:  107m OMNIPAQUE IOHEXOL 350 MG/ML SOLN COMPARISON:  None Available. FINDINGS: Cardiovascular: No filling defects in the pulmonary arteries to suggest pulmonary emboli. Mild cardiomegaly. Aorta normal caliber. Mediastinum/Nodes: No mediastinal, hilar, or axillary adenopathy. Trachea and esophagus are unremarkable. Thyroid unremarkable. Lungs/Pleura: Lungs are clear. No focal airspace opacities or suspicious nodules. No effusions. Upper Abdomen: Imaging into the upper abdomen demonstrates no acute  findings. Musculoskeletal: Chest wall soft tissues are unremarkable. No acute bony abnormality. Review of the MIP images confirms the above findings. IMPRESSION: No evidence of pulmonary embolus. Mild cardiomegaly. No acute cardiopulmonary disease. Electronically Signed   By: KRolm BaptiseM.D.   On: 07/23/2022 01:04   DG Chest 1 View  Result Date: 07/22/2022 CLINICAL DATA:  Left leg swelling and pain.  Hypoxia. EXAM: CHEST  1 VIEW COMPARISON:  03/23/2021. FINDINGS: Mildly enlarged cardiac silhouette. Clear lungs with normal vascularity. Unremarkable bones. Left axillary surgical clips. IMPRESSION: Mild cardiomegaly.  No acute abnormality. Electronically Signed   By: SClaudie ReveringM.D.   On: 07/22/2022 21:31    Procedures Procedures    Medications Ordered in ED Medications  acetaminophen (TYLENOL) tablet 650 mg (650 mg Oral Given 07/22/22 2057)  iohexol (OMNIPAQUE) 350 MG/ML injection 75 mL (75 mLs Intravenous Contrast Given 07/23/22 0037)  oxyCODONE (Oxy IR/ROXICODONE) immediate release tablet 5 mg (5 mg Oral Given 07/23/22 01025    ED Course/ Medical Decision Making/ A&P Clinical Course as of 07/24/22 08527 Sat Jul 22, 2022  2043 Glucose(!): 242 [CA]    Clinical Course User Index [CA] ASuzy Bouchard PA-C                           Medical Decision Making Patient here with left leg swelling.  He has no evidence of DVT, low suspicion for osteomyelitis.  Unsure of the etiology of his leg swelling.  I visualized plain films including left foot x-ray and CT angiogram chest and showed no acute findings.  Ocular ultrasound negative for DVT.  Reassuring.  Blood glucose elevated at 242.  CRP only mildly elevated.  I do not think this is due to osteomyelitis. She appears safe to return to skilled nursing facility.  Amount and/or Complexity of Data Reviewed Labs: ordered. Radiology: ordered and independent interpretation performed.  Risk Prescription drug management.      Final  Clinical Impression(s) / ED Diagnoses Final diagnoses:  Left leg swelling    Rx / DC  Orders ED Discharge Orders     None         Margarita Mail, PA-C 07/24/22 [place], April, MD 07/28/22 1227

## 2023-01-15 IMAGING — DX DG CHEST 1V PORT
1 series · 1 of 1 positions shown · non-contrast
Comparison: None.

CLINICAL DATA: Chest pain.  Hypertension

EXAM:
PORTABLE CHEST 1 VIEW

[chest ap]
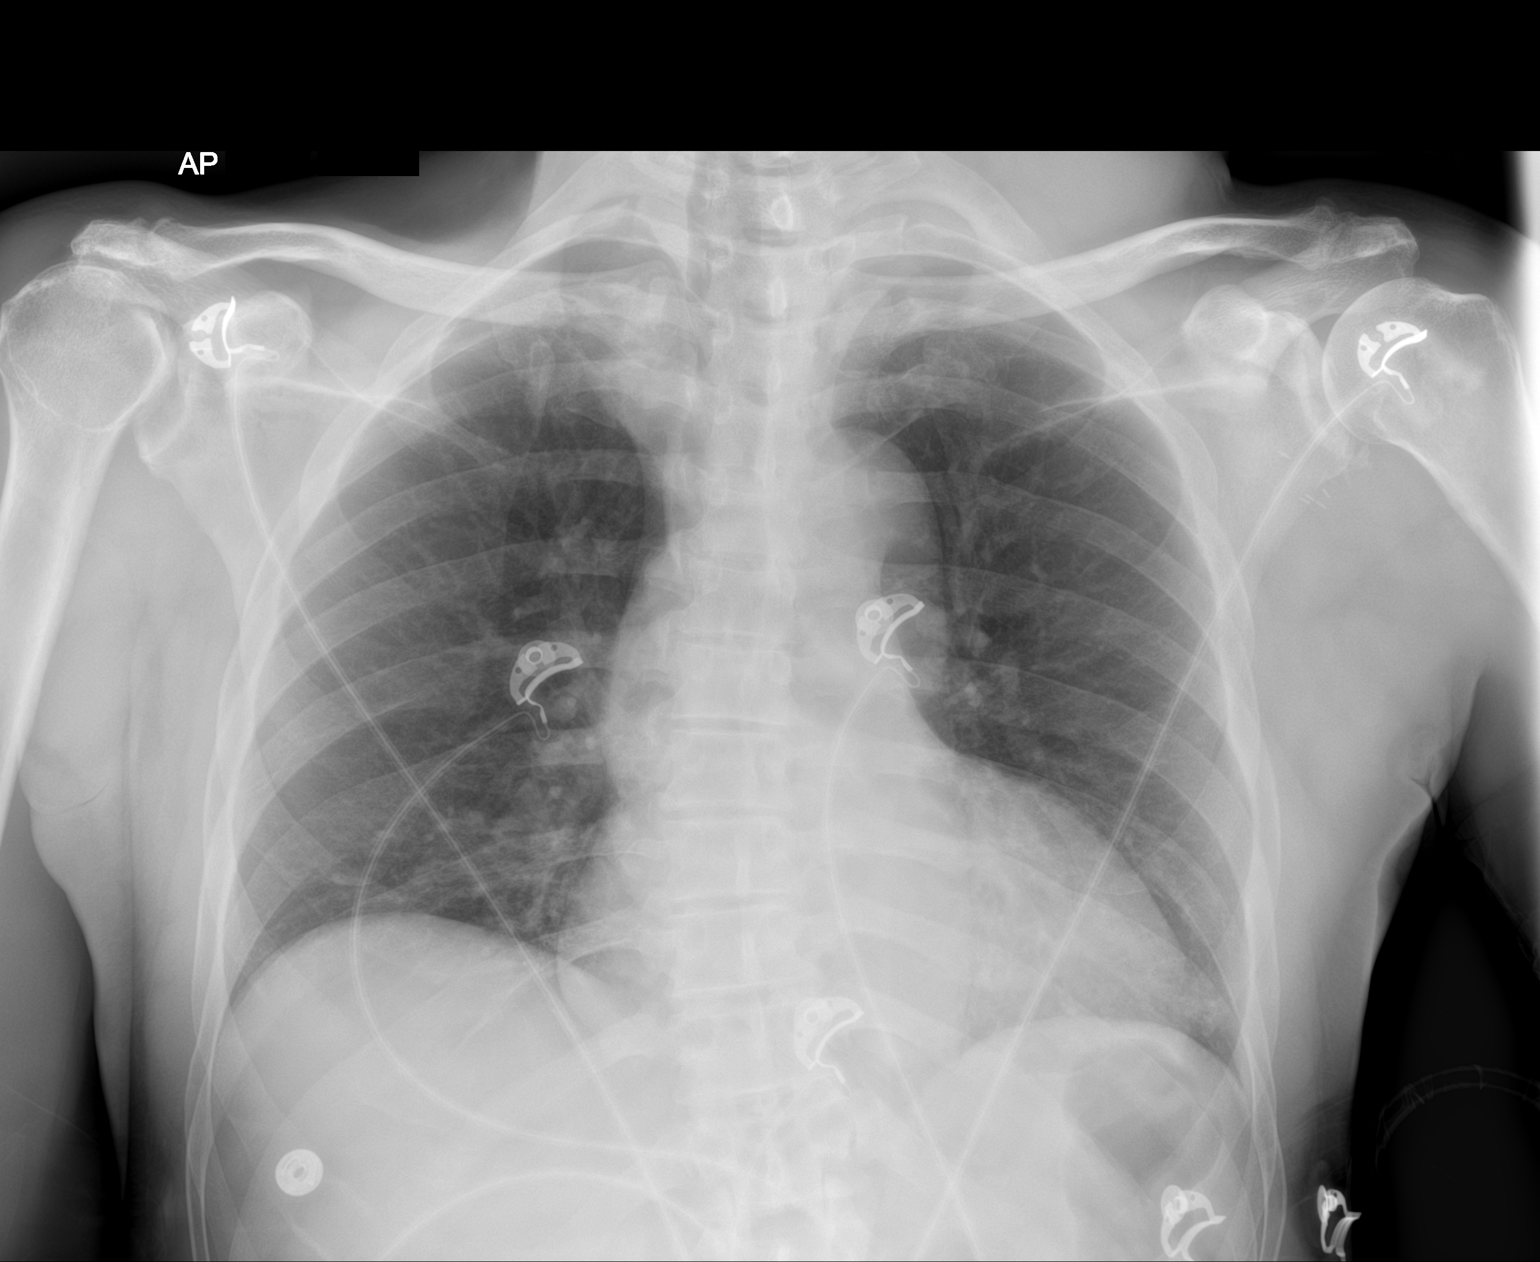

[1 of 1 positions shown; findings below may reference images not displayed]

FINDINGS: There is slight bibasilar atelectasis. No edema or airspace opacity.
Heart is mildly enlarged with pulmonary vascularity normal. No
adenopathy. There is degenerative change in each shoulder. There are
small radiopaque foreign bodies in the medial lower right neck
region.
IMPRESSION: Bibasilar atelectasis. No edema or airspace opacity. Heart is
enlarged with pulmonary vascularity within normal limits.

## 2023-01-27 IMAGING — CT CT HEAD W/O CM
4 series · 16 of 47 positions shown, 18 images · non-contrast
Comparison: 02/21/2021

CLINICAL DATA: Altered mental status, intracranial hemorrhage

EXAM:
CT HEAD WITHOUT CONTRAST
TECHNIQUE: Contiguous axial images were obtained from the base of the skull
through the vertex without intravenous contrast.

[Series 3: head without · axial · non-contrast · 0.43mm/px · z∈[-67,+33]mm · 6 of 30 slices shown, 8 images]
[im 5/30  brain]
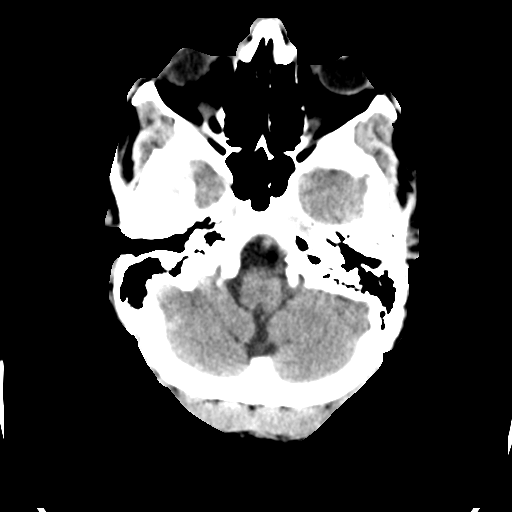
[im 5/30  bone]
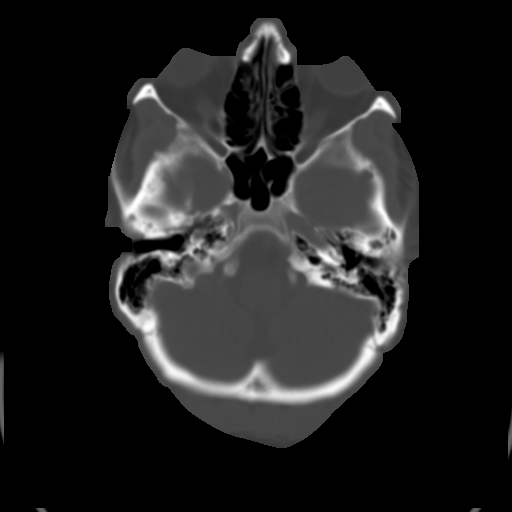
[im 9/30  brain]
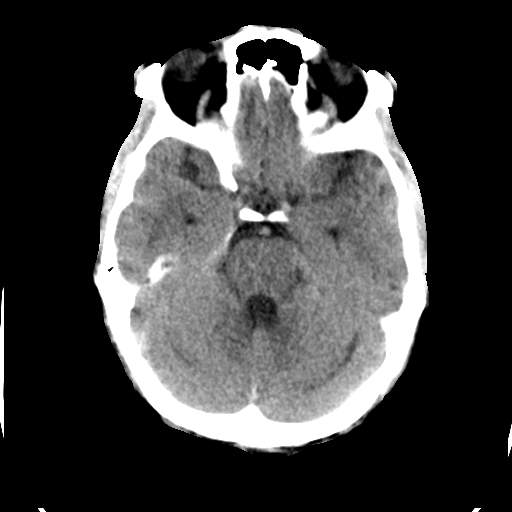
[im 13/30  brain]
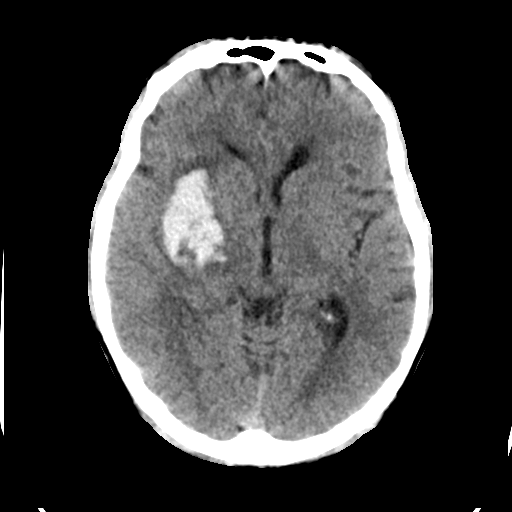
[im 17/30  brain]
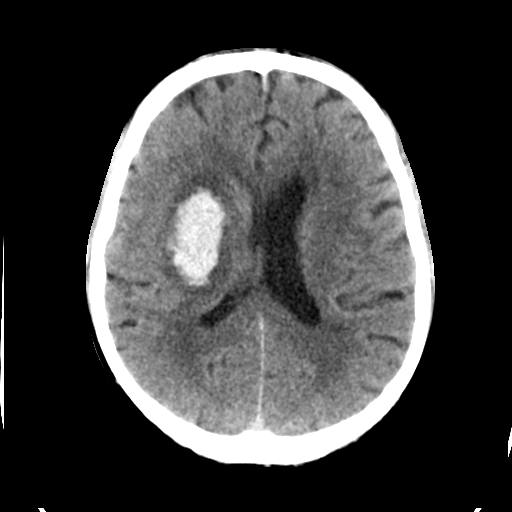
[im 21/30  brain]
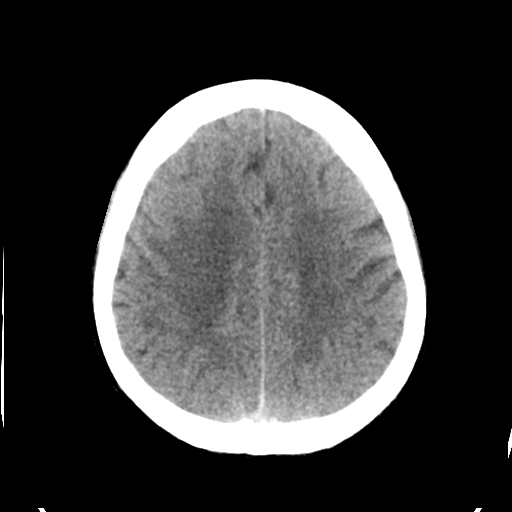
[im 21/30  bone]
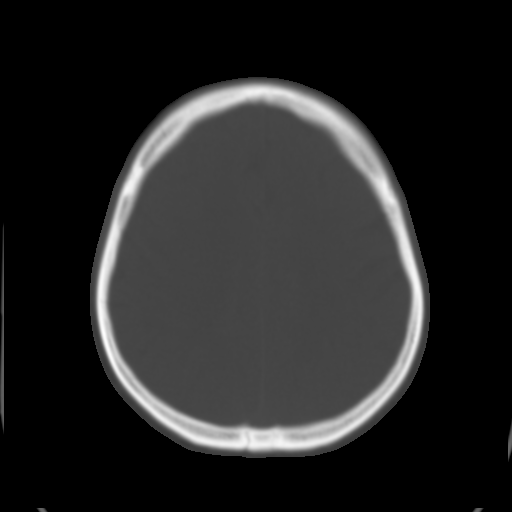
[im 25/30  brain]
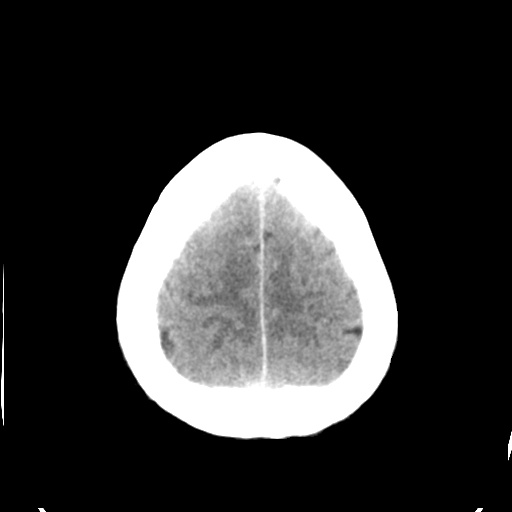

[Series 4: head bone · axial · 0.43mm/px · z∈[-73,-21]mm · 4 of 78 slices shown]
[im 8/78  bone]
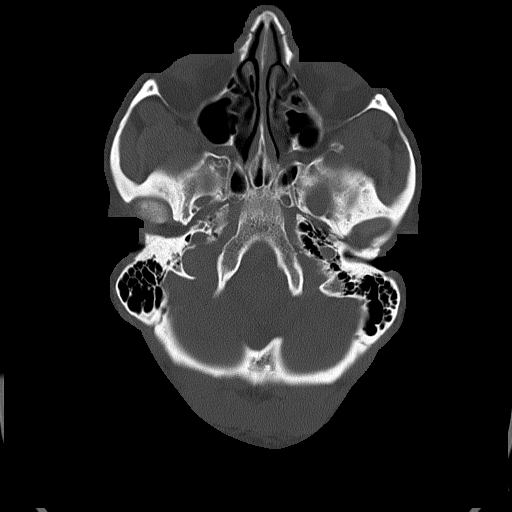
[im 15/78  bone]
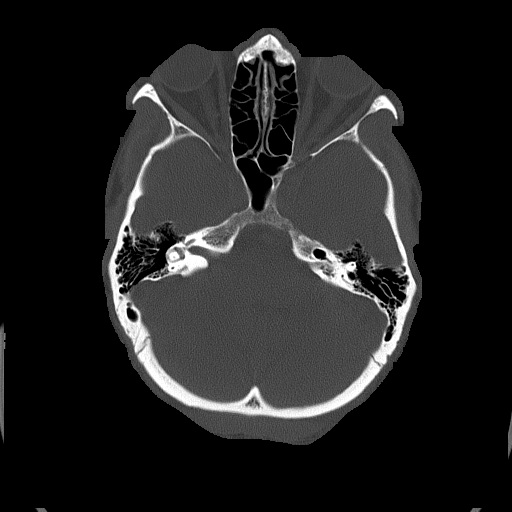
[im 26/78  bone]
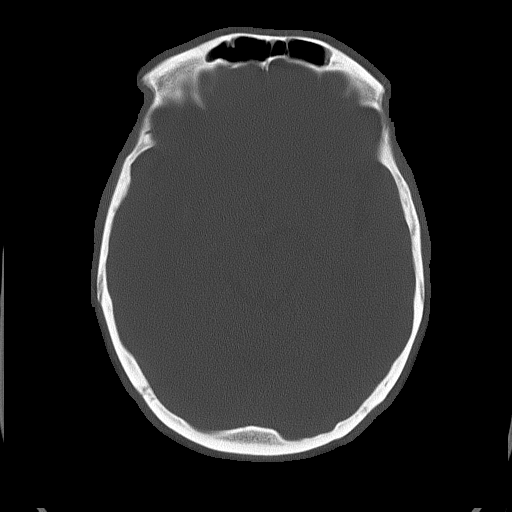
[im 34/78  bone]
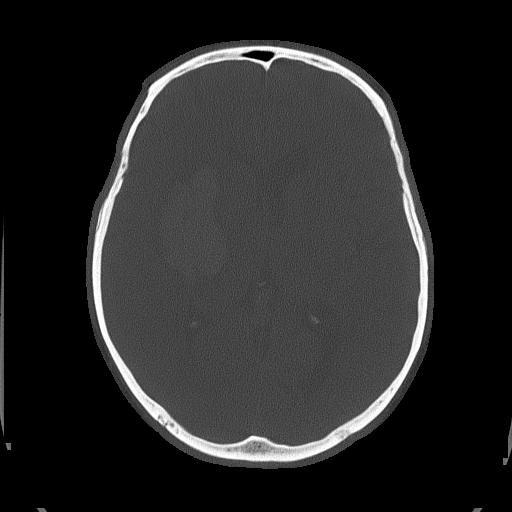

[Series 5: head without cor · coronal · non-contrast · 0.31mm/px · 3 of 67 slices shown]
[im 23/67  brain]
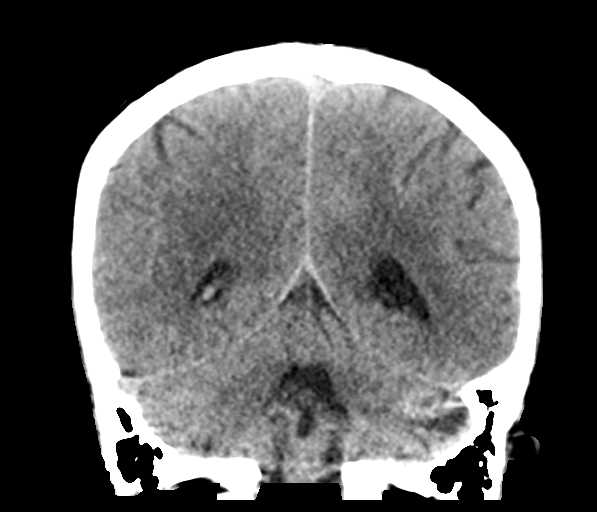
[im 30/67  brain]
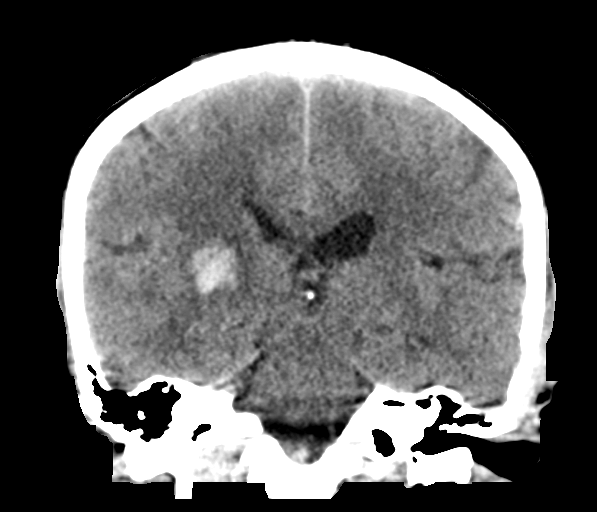
[im 37/67  brain]
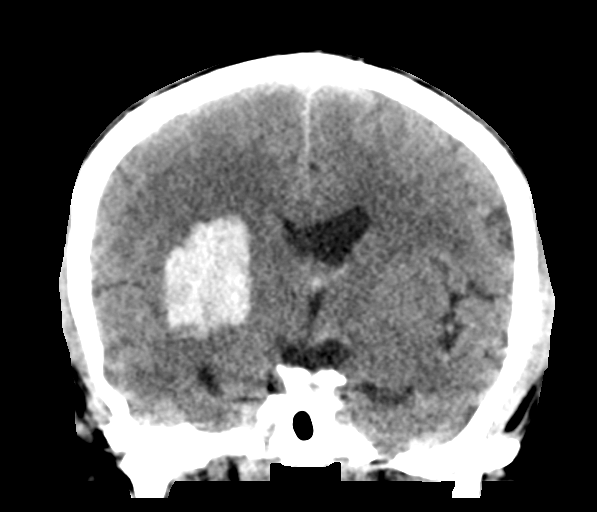

[Series 6: head without sag · sagittal · non-contrast · 0.31mm/px · 3 of 60 slices shown]
[im 20/60  brain]
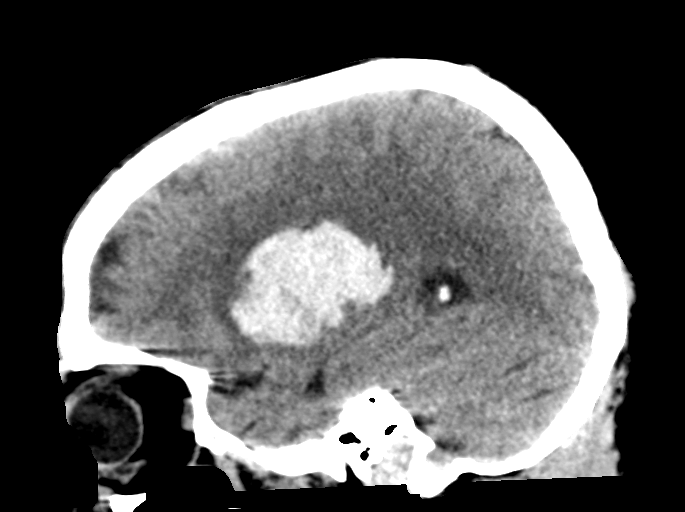
[im 30/60  brain]
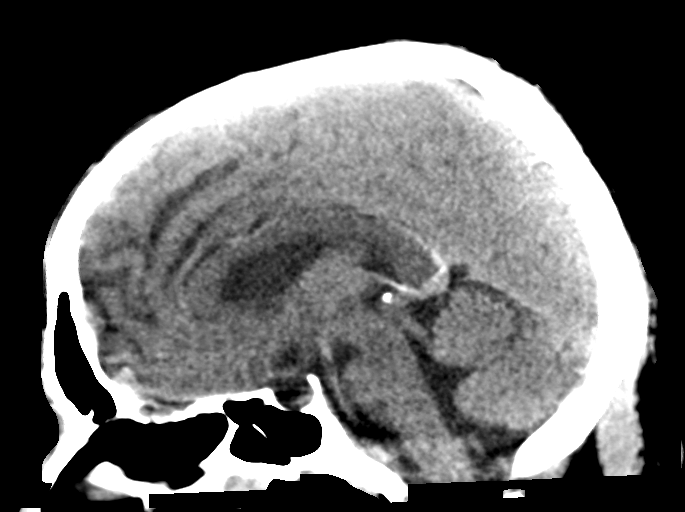
[im 40/60  brain]
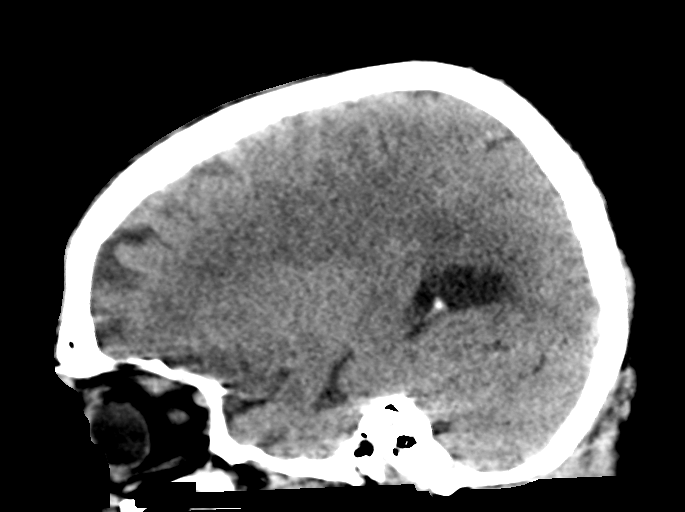

[16 of 47 positions shown; findings below may reference images not displayed]

FINDINGS: Brain: Intraparenchymal hematoma within the right basal ganglia is
stable measuring 28 x 55 x 40 mm at axial image # [DATE] and coronal
image # [DATE]. Mild surrounding cytotoxic edema and mass effect with
effacement of the right lateral ventricle and sylvian fissure
appears stable. 1-2 mm right to left midline shift appears stable.

No interval hemorrhage. Ventricular size is stable. Cerebellum is
unremarkable.

Vascular: No asymmetric hyperdense vasculature at the skull base.

Skull: The calvarium is intact.

Sinuses/Orbits: Mucous retention cysts are seen within the maxillary
sinuses bilaterally, incompletely visualized. The remaining
paranasal sinuses are clear. Orbits are unremarkable.

Other: Mastoid air cells and middle ear cavities are clear.
IMPRESSION: Stable right basal ganglia intraparenchymal hematoma and associated
mild mass effect. No hydrocephalus. No interval hemorrhage.

## 2023-01-27 IMAGING — MR MR HEAD WO/W CM
8 of 13 series · 32 of 48 positions shown · IV contrast (gadavist)
Comparison: Noncontrast head CT 02/22/2021. CT angiogram head/neck
02/21/2021. Noncontrast head CT 02/21/2021.

CLINICAL DATA: Brain mass or lesion; presenting with VERILUS, evaluate
for an underlying lesion.

EXAM:
MRI HEAD WITHOUT AND WITH CONTRAST
TECHNIQUE: Multiplanar, multiecho pulse sequences of the brain and surrounding
structures were obtained without and with intravenous contrast.
CONTRAST:  7mL GADAVIST GADOBUTROL 1 MMOL/ML IV SOLN

[Series 3: DWI · axial · 3.0mm · 1.09mm/px · z∈[-59,+95]mm · 9 of 106 slices shown (1 of 4)]
[im 1/106]
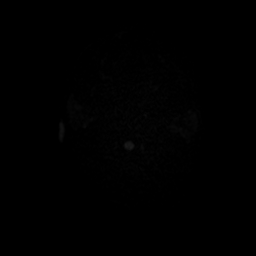
[im 14/106]
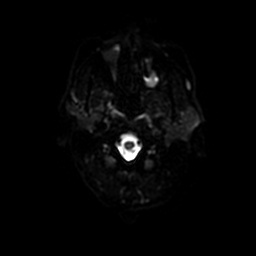
[im 27/106]
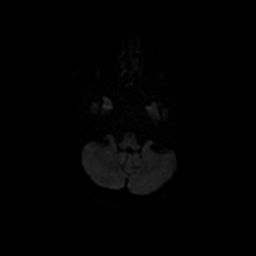
[im 40/106]
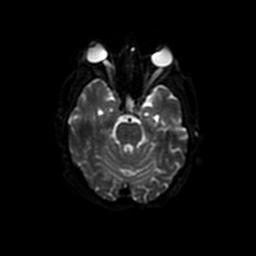
[im 53/106]
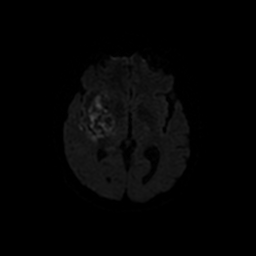
[im 66/106]
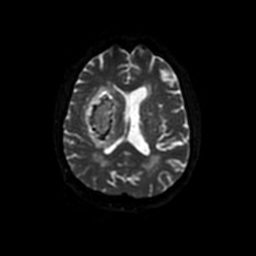
[im 79/106]
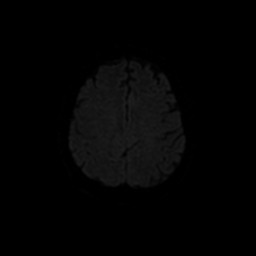
[im 92/106]
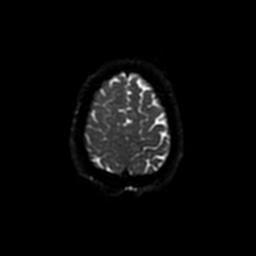
[im 106/106]
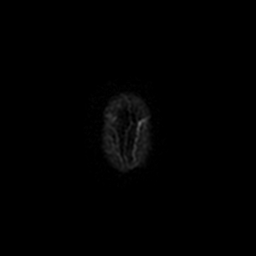

[Series 4: DWI · coronal · 5.0mm · 1.09mm/px · 6 of 78 slices shown (2 of 4)]
[im 1/78]
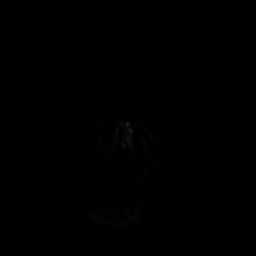
[im 16/78]
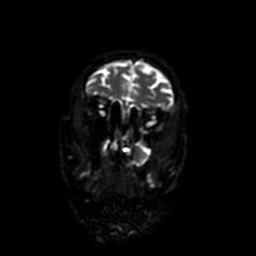
[im 31/78]
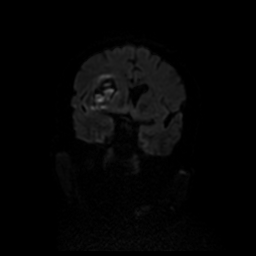
[im 47/78]
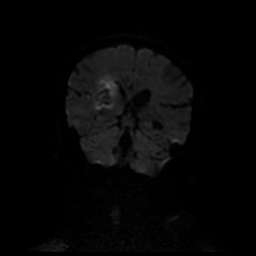
[im 62/78]
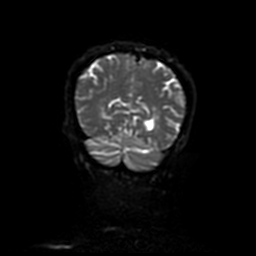
[im 78/78]
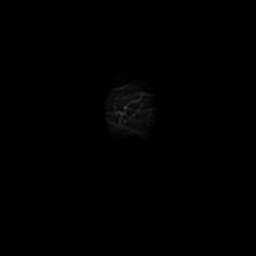

[Series 7: FLAIR · axial · 3.0mm · 0.43mm/px · z∈[-64,+79]mm · 2 of 25 slices shown]
[im 1/25]
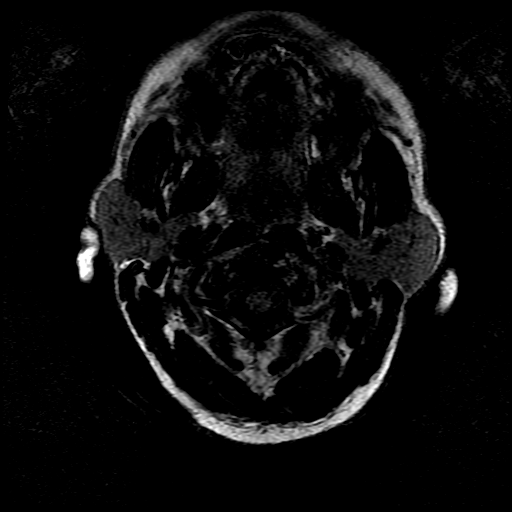
[im 25/25]
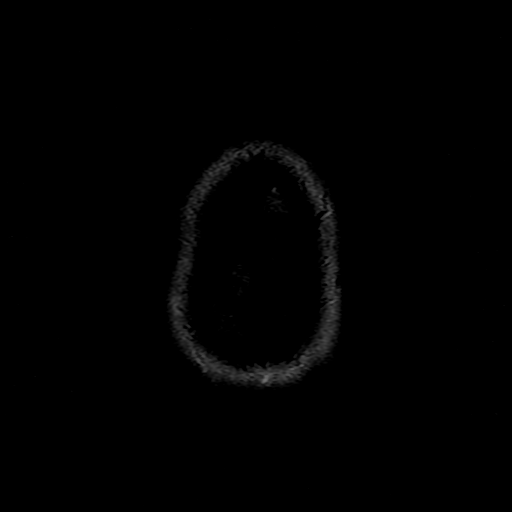

[Series 11: T1 post-contrast · axial · 3.0mm · 0.47mm/px · z∈[-68,+83]mm · 4 of 52 slices shown (1 of 3)]
[im 1/52]
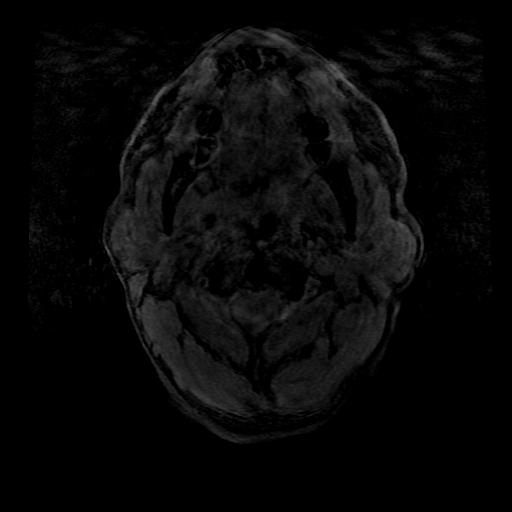
[im 18/52]
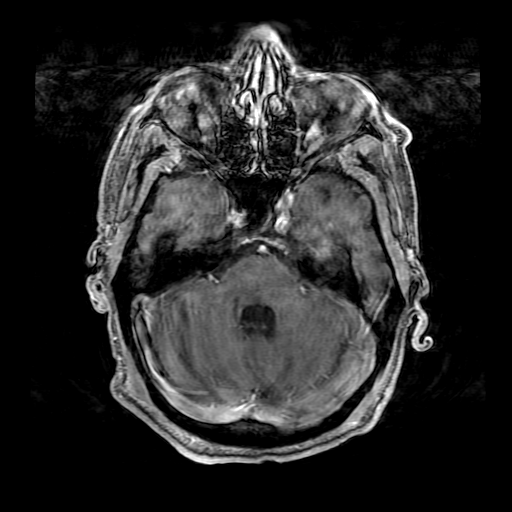
[im 35/52]
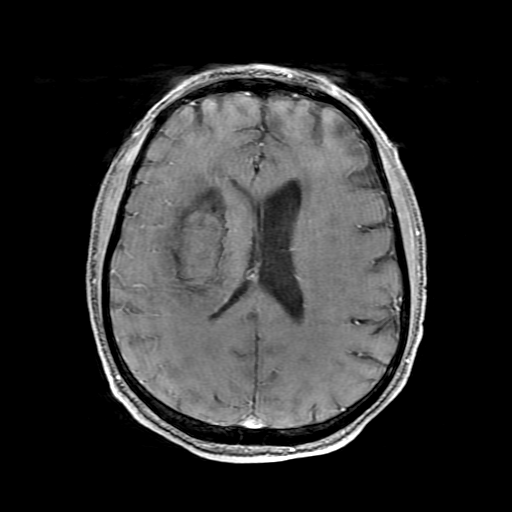
[im 52/52]
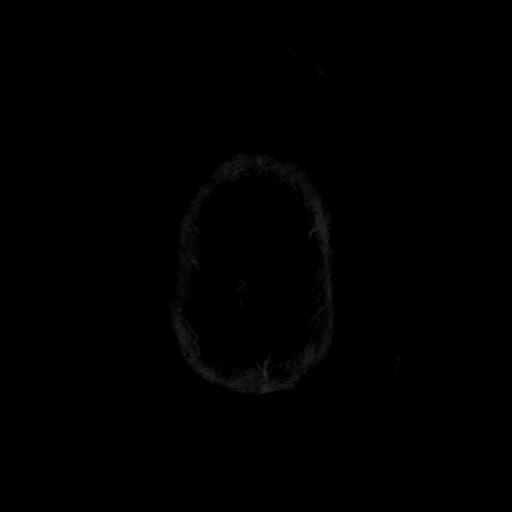

[Series 12: T1 post-contrast · coronal · 5.0mm · 0.39mm/px · 2 of 24 slices shown (2 of 3)]
[im 1/24]
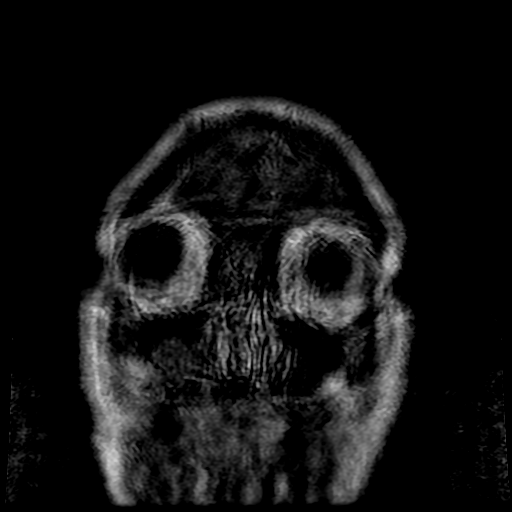
[im 24/24]
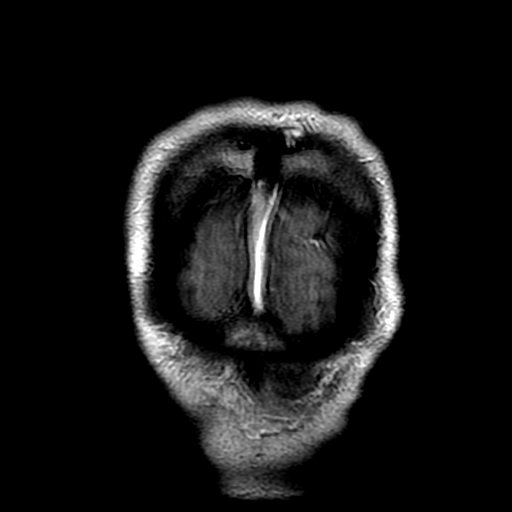

[Series 13: T1 post-contrast · sagittal · 5.0mm · 0.47mm/px · 2 of 24 slices shown (3 of 3)]
[im 1/24]
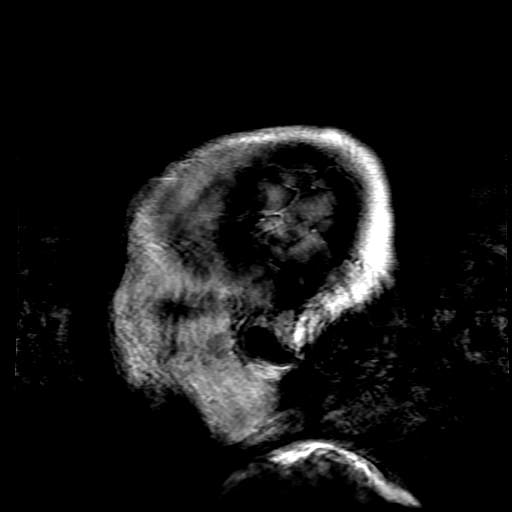
[im 24/24]
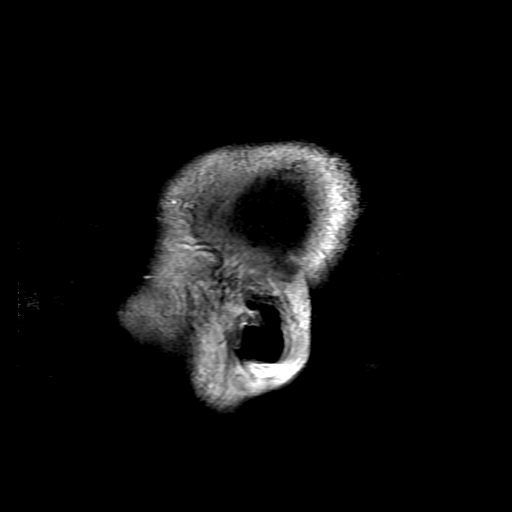

[Series 300: DWI · axial · 3.0mm · 1.09mm/px · z∈[-59,+95]mm · 4 of 53 slices shown (3 of 4)]
[im 1/53]
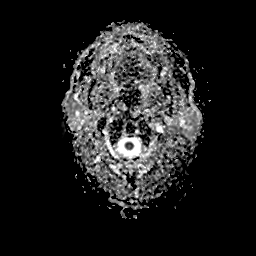
[im 18/53]
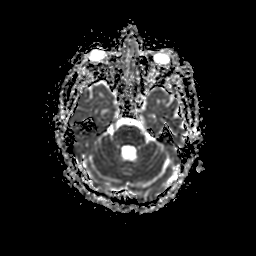
[im 35/53]
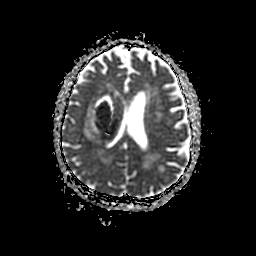
[im 53/53]
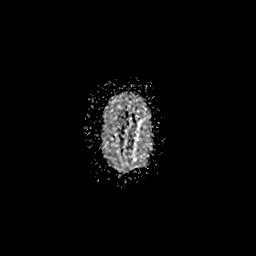

[Series 400: DWI · coronal · 5.0mm · 1.09mm/px · 3 of 39 slices shown (4 of 4)]
[im 1/39]
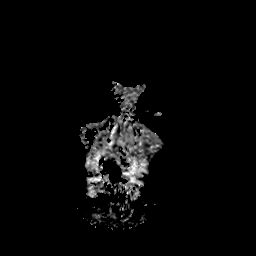
[im 20/39]
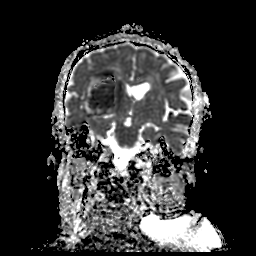
[im 39/39]
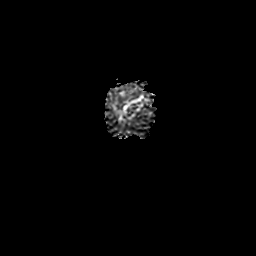

[32 of 48 positions shown; findings below may reference images not displayed]

FINDINGS: Brain:

Intermittently motion degraded examination. Most notably, there is
moderate/severe motion degradation of the coronal T1 weighted
postcontrast sequence and moderate/severe motion degradation of the
sagittal T1 weighted postcontrast sequence.

Cerebral volume is normal.

Redemonstrated acute parenchymal hemorrhage centered within the
right basal ganglia. The parenchymal hemorrhage has slightly
increased in size as compared to the head CT performed earlier
today, now measuring 5.5 x 3.1 cm in transaxial dimensions
(previously measuring 5.5 x 2.8 cm in transaxial dimensions).
Unchanged surrounding edema. No appreciable masslike or nodular
enhancement at the hemorrhage site on the current exam. Mass effect
has increased with increased partial effacement of the right lateral
and third ventricles. 3 mm leftward midline shift measured at the
level of the septum lucent has also slightly increased.

There is a subcentimeter cortical infarct along the posterior aspect
of the hemorrhage within the right temporoparietal cortex (series 3,
image 31). Additional subcentimeter focus of diffusion weighted
hyperintensity along the high posterior right frontal lobe (series
3, image 49) (series 4, image 12).

Moderate multifocal T2/FLAIR hyperintensity within the cerebral
white matter is nonspecific, but compatible with chronic small
vessel ischemic disease.

Chronic lacunar infarct within the central pons.

No extra-axial fluid collection.

No evidence of hydrocephalus.

Vascular: Expected proximal arterial flow voids.

Skull and upper cervical spine: No focal marrow lesion. Incompletely
assessed upper cervical spondylosis. A C3-C4 posterior disc
osteophyte contributes to at least mild spinal canal stenosis,
contacting and mildly flattening the ventral spinal cord.

Sinuses/Orbits: Visualized orbits show no acute finding. Trace right
frontal and bilateral ethmoid sinus mucosal thickening. Small and
moderate-sized bilateral maxillary sinus mucous retention cysts.
IMPRESSION: Significant intermittent motion degradation, as described and most
notably affecting the T1 weighted postcontrast imaging.

An acute parenchymal hemorrhage centered within the right basal
ganglia has slightly increased in size since the head CT performed
earlier today, now measuring 5.5 x 3.1 cm in transaxial dimensions.
No evidence of an underlying mass on the current exam. However,
contrast-enhanced MRI follow-up is recommended once the hematoma
involutes for further evaluation.

Edema surrounding the hemorrhage is similar. However, there is
increased mass effect with increased partial effacement of the right
lateral and third ventricles. 3 mm leftward midline shift measured
at the level of the septum pellucidum has also slightly increased.

Subcentimeter acute cortical infarct adjacent to the hemorrhage
within the right temporoparietal cortex.

Additional subcentimeter focus of apparent diffusion weighted
hyperintensity along the posterior high right frontal lobe, which is
favored to reflect artifact arising from the calvarium. However, an
additional small acute infarct cannot be excluded.

Moderate cerebral white matter chronic small vessel ischemic
disease.

Chronic lacunar infarct within the central pons.

Paranasal sinus disease as described.
